# Patient Record
Sex: Female | Born: 1935 | Race: White | Hispanic: No | Marital: Married | Smoking: Never smoker
Health system: Southern US, Community
[De-identification: ages and names within clinical notes are randomized; demographics above are authoritative.]

## PROBLEM LIST (undated history)

## (undated) DIAGNOSIS — R5381 Other malaise: Secondary | ICD-10-CM

## (undated) DIAGNOSIS — I639 Cerebral infarction, unspecified: Secondary | ICD-10-CM

## (undated) DIAGNOSIS — K219 Gastro-esophageal reflux disease without esophagitis: Secondary | ICD-10-CM

## (undated) DIAGNOSIS — C801 Malignant (primary) neoplasm, unspecified: Secondary | ICD-10-CM

## (undated) DIAGNOSIS — N390 Urinary tract infection, site not specified: Secondary | ICD-10-CM

## (undated) DIAGNOSIS — M1990 Unspecified osteoarthritis, unspecified site: Secondary | ICD-10-CM

## (undated) DIAGNOSIS — M069 Rheumatoid arthritis, unspecified: Secondary | ICD-10-CM

## (undated) DIAGNOSIS — G4700 Insomnia, unspecified: Secondary | ICD-10-CM

## (undated) DIAGNOSIS — I340 Nonrheumatic mitral (valve) insufficiency: Secondary | ICD-10-CM

## (undated) DIAGNOSIS — I351 Nonrheumatic aortic (valve) insufficiency: Secondary | ICD-10-CM

## (undated) DIAGNOSIS — Z8719 Personal history of other diseases of the digestive system: Secondary | ICD-10-CM

## (undated) DIAGNOSIS — F0390 Unspecified dementia without behavioral disturbance: Secondary | ICD-10-CM

## (undated) DIAGNOSIS — M4302 Spondylolysis, cervical region: Secondary | ICD-10-CM

## (undated) DIAGNOSIS — I10 Essential (primary) hypertension: Secondary | ICD-10-CM

## (undated) DIAGNOSIS — M8580 Other specified disorders of bone density and structure, unspecified site: Secondary | ICD-10-CM

## (undated) DIAGNOSIS — R4189 Other symptoms and signs involving cognitive functions and awareness: Secondary | ICD-10-CM

## (undated) DIAGNOSIS — D099 Carcinoma in situ, unspecified: Secondary | ICD-10-CM

## (undated) DIAGNOSIS — G459 Transient cerebral ischemic attack, unspecified: Secondary | ICD-10-CM

## (undated) DIAGNOSIS — E785 Hyperlipidemia, unspecified: Secondary | ICD-10-CM

## (undated) DIAGNOSIS — C569 Malignant neoplasm of unspecified ovary: Secondary | ICD-10-CM

## (undated) DIAGNOSIS — Z973 Presence of spectacles and contact lenses: Secondary | ICD-10-CM

## (undated) DIAGNOSIS — N12 Tubulo-interstitial nephritis, not specified as acute or chronic: Secondary | ICD-10-CM

## (undated) DIAGNOSIS — R627 Adult failure to thrive: Secondary | ICD-10-CM

## (undated) DIAGNOSIS — C50919 Malignant neoplasm of unspecified site of unspecified female breast: Secondary | ICD-10-CM

## (undated) HISTORY — DX: Other malaise: R53.81

## (undated) HISTORY — DX: Rheumatoid arthritis, unspecified: M06.9

## (undated) HISTORY — DX: Hyperlipidemia, unspecified: E78.5

## (undated) HISTORY — DX: Other specified disorders of bone density and structure, unspecified site: M85.80

## (undated) HISTORY — PX: EYE SURGERY: SHX253

## (undated) HISTORY — DX: Tubulo-interstitial nephritis, not specified as acute or chronic: N12

## (undated) HISTORY — DX: Malignant neoplasm of unspecified site of unspecified female breast: C50.919

## (undated) HISTORY — DX: Spondylolysis, cervical region: M43.02

## (undated) HISTORY — DX: Nonrheumatic aortic (valve) insufficiency: I35.1

## (undated) HISTORY — DX: Malignant neoplasm of unspecified ovary: C56.9

## (undated) HISTORY — PX: BREAST LUMPECTOMY: SHX2

## (undated) HISTORY — DX: Transient cerebral ischemic attack, unspecified: G45.9

## (undated) HISTORY — PX: KNEE ARTHROSCOPY: SHX127

## (undated) HISTORY — DX: Nonrheumatic mitral (valve) insufficiency: I34.0

## (undated) HISTORY — DX: Insomnia, unspecified: G47.00

## (undated) HISTORY — DX: Gastro-esophageal reflux disease without esophagitis: K21.9

## (undated) HISTORY — DX: Adult failure to thrive: R62.7

---

## 1987-09-26 HISTORY — PX: ABDOMINAL HYSTERECTOMY: SHX81

## 1994-09-25 DIAGNOSIS — I639 Cerebral infarction, unspecified: Secondary | ICD-10-CM

## 1994-09-25 HISTORY — DX: Cerebral infarction, unspecified: I63.9

## 1998-01-11 ENCOUNTER — Encounter: Admission: RE | Admit: 1998-01-11 | Discharge: 1998-04-11 | Payer: Self-pay | Admitting: *Deleted

## 1998-06-24 ENCOUNTER — Ambulatory Visit (HOSPITAL_COMMUNITY): Admission: RE | Admit: 1998-06-24 | Discharge: 1998-06-24 | Payer: Self-pay | Admitting: *Deleted

## 1998-09-15 ENCOUNTER — Other Ambulatory Visit: Admission: RE | Admit: 1998-09-15 | Discharge: 1998-09-15 | Payer: Self-pay | Admitting: Gynecology

## 1998-12-01 ENCOUNTER — Ambulatory Visit (HOSPITAL_COMMUNITY): Admission: RE | Admit: 1998-12-01 | Discharge: 1998-12-01 | Payer: Self-pay | Admitting: Gynecology

## 1998-12-01 ENCOUNTER — Encounter: Payer: Self-pay | Admitting: Gynecology

## 1999-06-13 ENCOUNTER — Encounter: Payer: Self-pay | Admitting: Gynecology

## 1999-06-13 ENCOUNTER — Ambulatory Visit (HOSPITAL_COMMUNITY): Admission: RE | Admit: 1999-06-13 | Discharge: 1999-06-13 | Payer: Self-pay | Admitting: Gynecology

## 1999-09-29 ENCOUNTER — Other Ambulatory Visit: Admission: RE | Admit: 1999-09-29 | Discharge: 1999-09-29 | Payer: Self-pay | Admitting: Gynecology

## 2000-07-03 ENCOUNTER — Encounter: Payer: Self-pay | Admitting: *Deleted

## 2000-07-03 ENCOUNTER — Ambulatory Visit (HOSPITAL_COMMUNITY): Admission: RE | Admit: 2000-07-03 | Discharge: 2000-07-03 | Payer: Self-pay | Admitting: *Deleted

## 2000-10-02 ENCOUNTER — Other Ambulatory Visit: Admission: RE | Admit: 2000-10-02 | Discharge: 2000-10-02 | Payer: Self-pay | Admitting: Gynecology

## 2001-07-11 ENCOUNTER — Encounter: Payer: Self-pay | Admitting: *Deleted

## 2001-07-11 ENCOUNTER — Encounter: Admission: RE | Admit: 2001-07-11 | Discharge: 2001-07-11 | Payer: Self-pay | Admitting: *Deleted

## 2001-10-08 ENCOUNTER — Other Ambulatory Visit: Admission: RE | Admit: 2001-10-08 | Discharge: 2001-10-08 | Payer: Self-pay | Admitting: Gynecology

## 2002-07-17 ENCOUNTER — Encounter: Admission: RE | Admit: 2002-07-17 | Discharge: 2002-07-17 | Payer: Self-pay | Admitting: *Deleted

## 2002-07-17 ENCOUNTER — Encounter: Payer: Self-pay | Admitting: *Deleted

## 2002-10-10 ENCOUNTER — Other Ambulatory Visit: Admission: RE | Admit: 2002-10-10 | Discharge: 2002-10-10 | Payer: Self-pay | Admitting: Gynecology

## 2003-01-09 ENCOUNTER — Encounter: Payer: Self-pay | Admitting: Ophthalmology

## 2003-01-13 ENCOUNTER — Ambulatory Visit (HOSPITAL_COMMUNITY): Admission: RE | Admit: 2003-01-13 | Discharge: 2003-01-13 | Payer: Self-pay | Admitting: Ophthalmology

## 2003-07-20 ENCOUNTER — Encounter: Admission: RE | Admit: 2003-07-20 | Discharge: 2003-07-20 | Payer: Self-pay | Admitting: *Deleted

## 2003-07-20 ENCOUNTER — Encounter: Payer: Self-pay | Admitting: *Deleted

## 2003-10-15 ENCOUNTER — Other Ambulatory Visit: Admission: RE | Admit: 2003-10-15 | Discharge: 2003-10-15 | Payer: Self-pay | Admitting: Gynecology

## 2004-02-25 ENCOUNTER — Encounter: Admission: RE | Admit: 2004-02-25 | Discharge: 2004-02-25 | Payer: Self-pay | Admitting: Rheumatology

## 2004-07-21 ENCOUNTER — Encounter: Admission: RE | Admit: 2004-07-21 | Discharge: 2004-07-21 | Payer: Self-pay | Admitting: Gynecology

## 2004-11-03 ENCOUNTER — Other Ambulatory Visit: Admission: RE | Admit: 2004-11-03 | Discharge: 2004-11-03 | Payer: Self-pay | Admitting: Gynecology

## 2004-11-21 ENCOUNTER — Inpatient Hospital Stay (HOSPITAL_COMMUNITY): Admission: EM | Admit: 2004-11-21 | Discharge: 2004-11-23 | Payer: Self-pay | Admitting: Internal Medicine

## 2005-08-09 ENCOUNTER — Encounter: Admission: RE | Admit: 2005-08-09 | Discharge: 2005-08-09 | Payer: Self-pay | Admitting: *Deleted

## 2005-11-29 ENCOUNTER — Other Ambulatory Visit: Admission: RE | Admit: 2005-11-29 | Discharge: 2005-11-29 | Payer: Self-pay | Admitting: Gynecology

## 2006-01-24 ENCOUNTER — Encounter: Payer: Self-pay | Admitting: *Deleted

## 2006-08-20 ENCOUNTER — Encounter: Admission: RE | Admit: 2006-08-20 | Discharge: 2006-08-20 | Payer: Self-pay | Admitting: Pediatric Nephrology

## 2006-12-07 ENCOUNTER — Other Ambulatory Visit: Admission: RE | Admit: 2006-12-07 | Discharge: 2006-12-07 | Payer: Self-pay | Admitting: Gynecology

## 2007-10-01 ENCOUNTER — Encounter: Admission: RE | Admit: 2007-10-01 | Discharge: 2007-10-01 | Payer: Self-pay | Admitting: Gynecology

## 2008-11-04 ENCOUNTER — Encounter: Admission: RE | Admit: 2008-11-04 | Discharge: 2008-11-04 | Payer: Self-pay | Admitting: Gynecology

## 2008-12-17 ENCOUNTER — Encounter: Admission: RE | Admit: 2008-12-17 | Discharge: 2008-12-17 | Payer: Self-pay | Admitting: Rheumatology

## 2010-02-05 IMAGING — CR DG CERVICAL SPINE COMPLETE 4+V
6 series · 6 of 6 positions shown · non-contrast
Comparison: none

CLINICAL DATA: Bilateral neck pain.  Numbness and tingling in
bilateral fingers.  No injury.

CERVICAL SPINE - COMPLETE 4+ VIEW
cervical spine radiographs.

[view not recorded (1 of 6)]
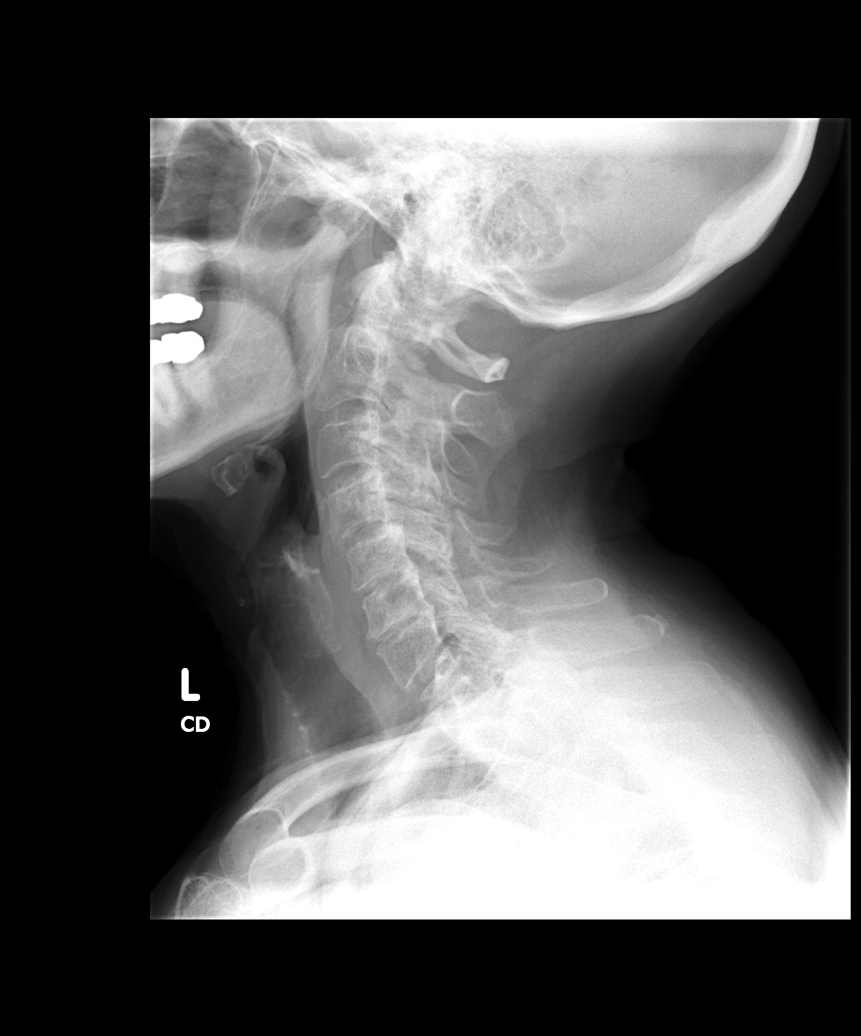

[view not recorded (2 of 6)]
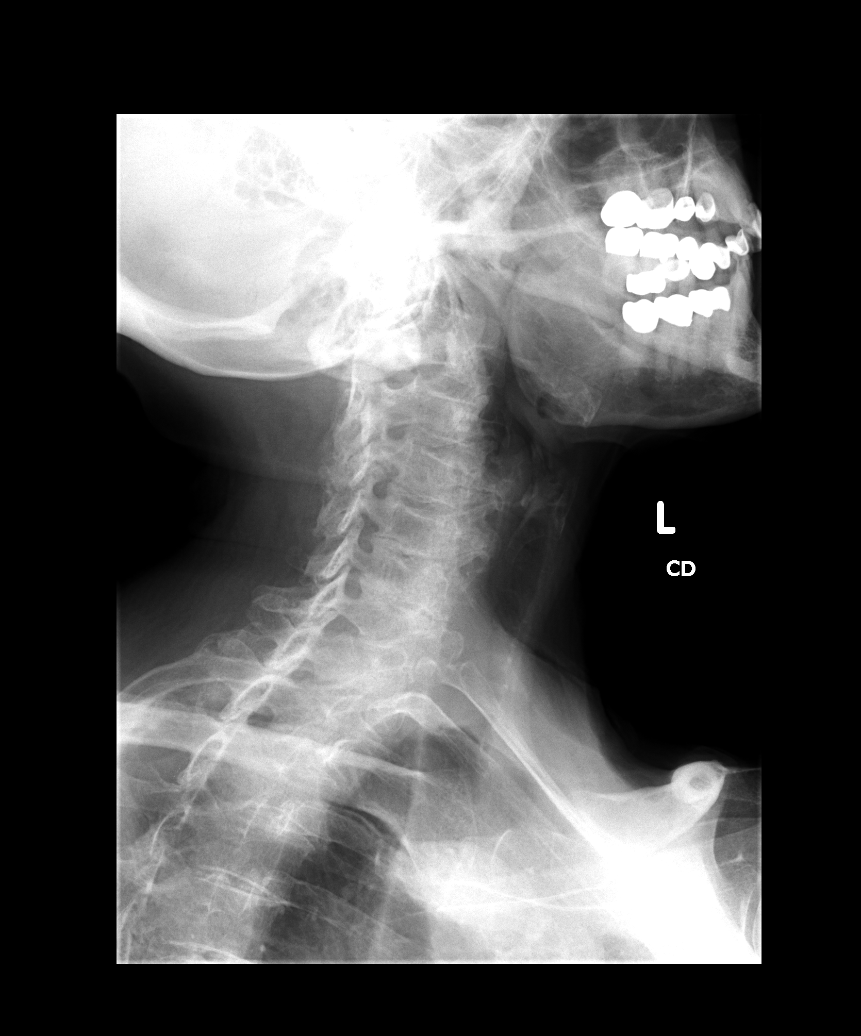

[view not recorded (3 of 6)]
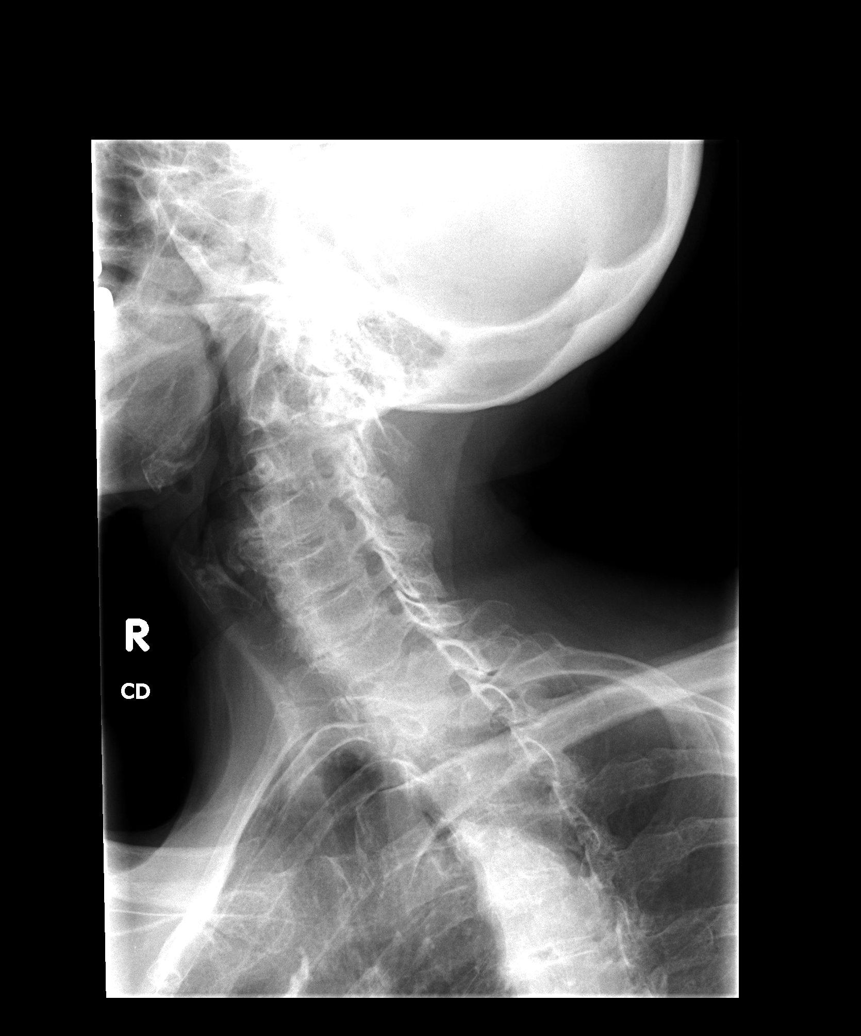

[view not recorded (4 of 6)]
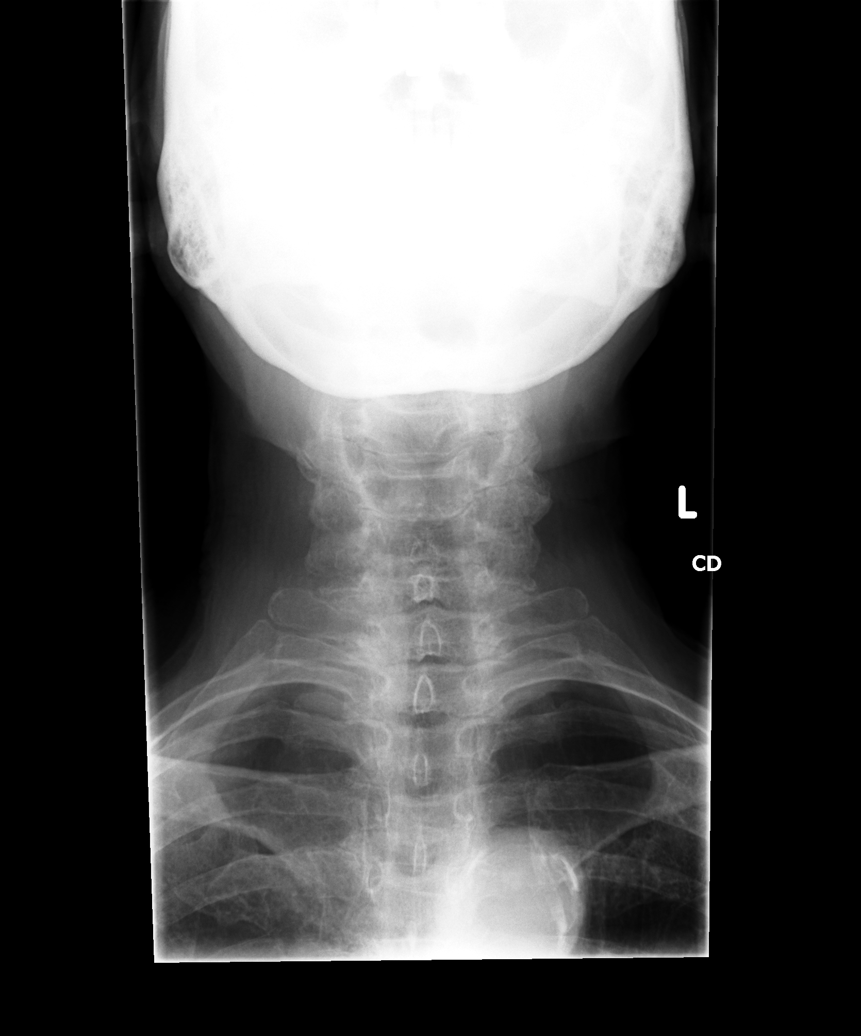

[view not recorded (5 of 6)]
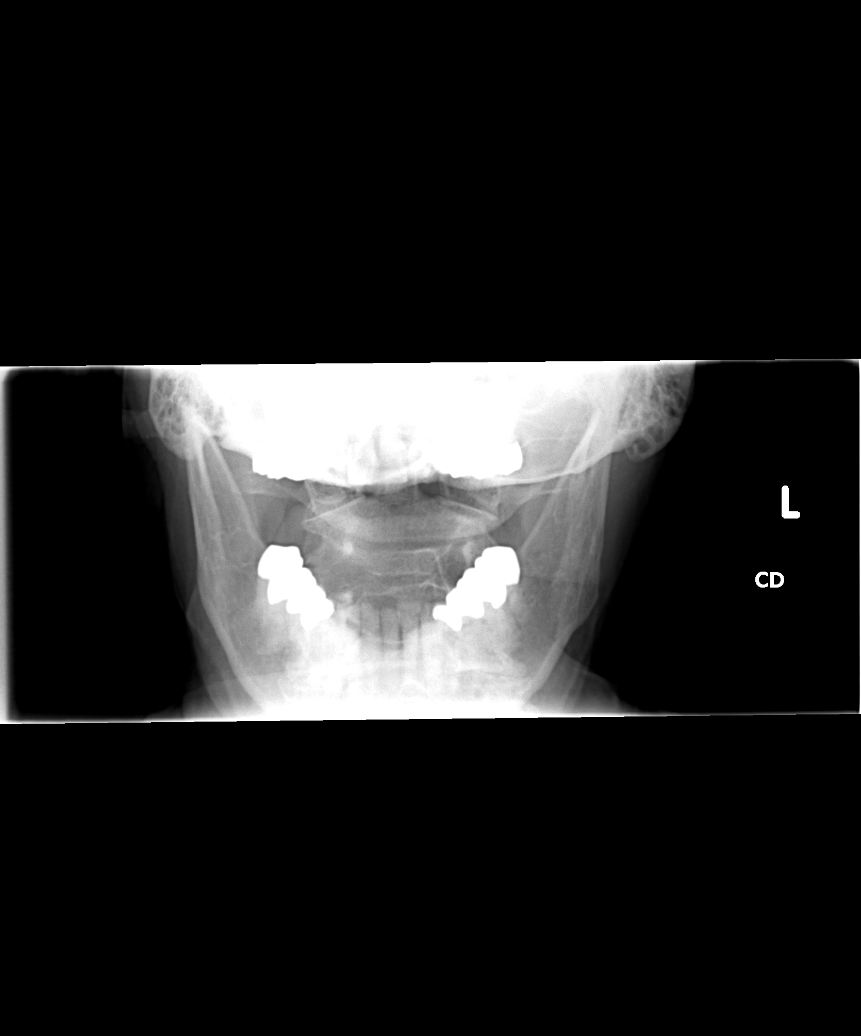

[view not recorded (6 of 6)]
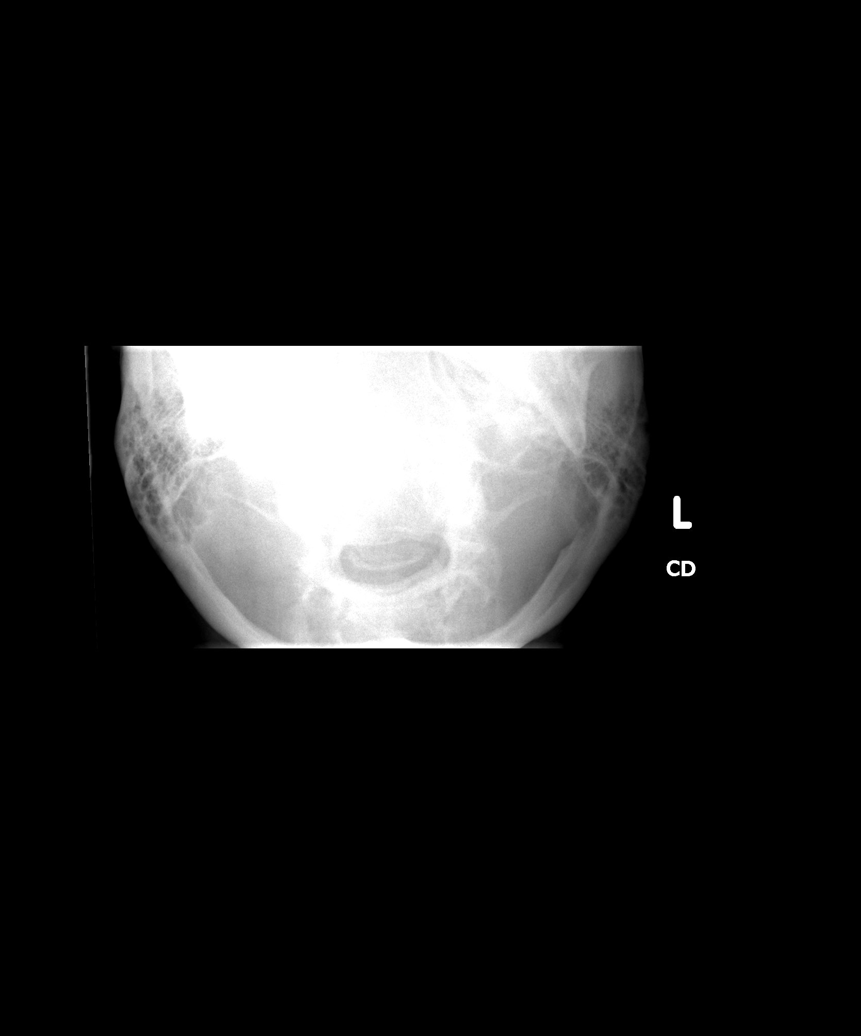

[6 of 6 positions shown; findings below may reference images not displayed]

FINDINGS: Diffuse osteopenia with stable moderately severe
degenerative disc disease C5-6 and C6-7 noted.  Interval 2 mm
degenerative anterolisthesis is seen at C4-5.  Remaining posterior
vertebral alignment normally maintained.  Uncinate degenerative
joint disease with slight bilateral neural foraminal narrowing
maximal at C6-7 and lesser C5-6 and C4-5.  Stable bilateral facet
degenerative joint disease is maximal at C4-5 and C5-6.
IMPRESSION: 1.  Interval 2 mm degenerative anterolisthesis C4-5.
2.  Stable diffuse osteopenia and multilevel degenerative changes
as described.
3.  No additional acute findings.

## 2011-02-10 NOTE — Discharge Summary (Signed)
NAMEJLEE, Alexa Rowe                 ACCOUNT NO.:  [id number]   MEDICAL RECORD NO.:  [id number]          PATIENT TYPE:  INP   LOCATION:  0450                         FACILITY:  [facility]   PHYSICIAN:  Jackie Plum, M.D.DATE OF BIRTH:  04-15-1918   DATE OF ADMISSION:  11/21/2004  DATE OF DISCHARGE:  11/23/2004                                 DISCHARGE SUMMARY   DISCHARGE DIAGNOSES:  1.  Intractable nausea and vomiting presents secondary to partial small-      bowel obstruction, resolved.  2.  __________ abdominal x-ray did not reveal any acute infiltrate.  There      is question of probable chronic obstructive pulmonary disease -      outpatient followup recommended.  Patient respiratory wise is otherwise      doing well with a discharge oxygen saturation of 98% on room air.  3.  History of ovarian cancer status post radiation therapy, hypertension,      dyslipidemia, arthritis.   DISCHARGE MEDICATIONS:  Patient is going home to resume all her preadmission  medications as previously.  These include methotrexate, prednisone,  Celebrex, hydrochlorothiazide, Fosamax, folic acid, baby aspirin,  multivitamin, metoprolol, verapamil and ranitidine.   DISCHARGE LABORATORIES:  WBC 7.5, hemoglobin 12.8, hematocrit 36.9, MCV 2.7,  platelet count 242.  Sodium 138, potassium 3.4, chloride 105, CO2 26,  glucose 96, BUN 9, creatinine 0.8, calcium 8.4.   CONSULTATION:  Not applicable.   PROCEDURES:  Not applicable.   DISPOSITION:  Discharged improved and satisfactory.   REASON FOR ADMISSION:  Nausea and vomiting and presumptive small-bowel  obstruction.  Patient presented to her PCP's office with prolonged worsening  of nausea/vomiting.  She has history of multiple abdominal surgeries.  Dr.  Hyacinth Meeker who was covering for patient's PCP Dr. Janey Greaser obtained an x-ray of  the abdomen which according to reading indicated a partial small-bowel  obstruction and was therefore referred to the hospital  for admission.  On  admission patient was not in acute distress.  Her lung exam is unremarkable,  her abdomen was soft with mild tenderness in the periumbilical area, bowel  sounds were present and they were slightly hyperactive.  Her lab work was  notable for sodium of 128 with normal potassium of 3.6 and BUN of 17,  creatinine 1.1.  She is therefore admitted for presumptive small-bowel  obstruction, partial with secondary intractable nausea and vomiting as well  as hyponatremia.   HOSPITAL COURSE:  Patient was started on a regimen of bowel rest and IV  fluid supplementation with monitoring of her electrolytes.  Followup x-ray  revealed absence of any obstruction and she was started on clear fluids  which was gradually advanced.  Patient able to tolerate diet  without any  problems over the last 24 hours and she is deemed appropriate for discharge  today.  Her bowel obstruction has therefore resolved and her hyponatremia  has also been corrected.   Patient had been complaining of some annoying cough which was treated  symptomatically since x-ray did reveal any acute infiltrate as noted above  and the incidental finding  of possible COPD and she may need outpatient PFTs  in this regard if deemed appropriate.  Her O2 saturation seems to be okay at  98% on room air though.  On rounds today patient feels fine, does not have  any nausea or vomiting, no fever or chills, no shortness of breath, no chest  pain, she has been tolerant of all her meals and is ready to go home.  Her  vital signs this morning were stable, her BP was 164/84 (patient on  admission we held her verapamil which is going to be added back to her  medication regimen and hopefully this would improve her systolic  hypertension, her heart rate is 97 per minute).  Her temperature was 98.5,  saturation is 98% on room air, lung exam does not reveal any adventitious  sounds, abdomen is soft and nontender, bowel sounds present.   There was no  __________, there is no edema and no cords.  She is alert, oriented x3,  ambulatory without any problems and is ready for discharge with outpatient  followup routinely as needed.      GO/MEDQ  D:  11/23/2004  T:  11/23/2004  Job:  [id number]

## 2011-02-10 NOTE — H&P (Signed)
Alexa Rowe, Alexa Rowe                 ACCOUNT NO.:  [id number]   MEDICAL RECORD NO.:  [id number]          PATIENT TYPE:  INP   LOCATION:  0450                         FACILITY:  [facility]   PHYSICIAN:  Jackie Plum, M.D.DATE OF BIRTH:  [birth year]   DATE OF ADMISSION:  11/21/2004  DATE OF DISCHARGE:                                HISTORY & PHYSICAL   CHIEF COMPLAINT:  Nausea and vomiting.   HISTORY OF PRESENT ILLNESS:  The patient is an 77 year old [place] lady  with a history of previous abdominal surgeries who presents with above  complaints.  According to the patient, she had been in her usual state of  health until three days ago when she started having nausea and vomiting.  She has had some mild abdominal discomfort mainly in the periumbilical area,  redundant diarrhea, but admits to constipation.  No history of dysuria or  frequency of micturition. She denies any history of heat or cold  intolerance. She denies any chest pain, shortness of breath, cough, sputum  production.  She denies any dizziness. She has not been able to eat much  over the last three days because of her nausea and vomiting and therefore  wanted to see her primary care physician. When she was seen by Dr. Hyacinth Meeker,  an x-ray was done which indicated partial small bowel obstruction and she  was referred to the hospital for that admission.   PAST MEDICAL HISTORY:  1.  History of intestinal blockages several times 1992.  The last one was      in August 1996.  2.  She has a history of ovarian cancer status post radiation treatment.  3.  She has a history of hypertension.  4.  History of arthritis.  5.  History of dyslipidemia.   MEDICATIONS:  Negative for any allergies.   CURRENT MEDICATIONS:  1.  Ranitidine 150 mg at night.  2.  Verapamil 120 mg daily.  3.  Metoprolol 50 mg daily.  4.  Multivitamins.  5.  Baby aspirin.  6.  Folic acid.  7.  Fosamax.  8.  HCTZ.  9.  Celebrex.  10. Prednisone.  11. She  takes methotrexate weekly.   SOCIAL HISTORY:  The patient lives with her husband, she is really  independent with activities of daily living. Does no smoke cigarettes or  drink alcohol.   FAMILY HISTORY:  Insignificant in view of her advanced age.   PHYSICAL EXAMINATION:  VITAL SIGNS:  Stable.  HEENT:  Normocephalic, atraumatic.  Pupils equal round and reactive to  light. Extraocular movements intact. Oropharynx is slightly dry, no exudate  or erythema.  NECK:  Supple, no JVD.  LUNGS:  Clear to auscultation.  ABDOMEN:  She had subumbilical surgical scar. It was old and was healed.  Abdomen was soft, however, she had some mild tenderness in the periumbilical  area. Bowel sounds were present and were slightly hyperactive.  No rebound  tenderness.  EXTREMITIES:  No cyanosis, no edema.  CNS:  The patient is alert.  Exam was nonfocal.   LABORATORY DATA:  X-ray of the abdomen  was read by Dr. Sigmund Hazel as  dictated, small partial obstruction.  X-ray is not available for my review  at this point.  CBC within normal limits. Hemoglobin was 13.4.  Basic  metabolic panel, sodium was 128, potassium 3.6, chloride 0 to 26. Glucose  115, BUN 17, creatinine 1.1, calcium 9.2.   IMPRESSION:  1.  Presumptive small bowel obstruction with subsequent intractable nausea      and vomiting.  2.  Hypokalemia secondary to #1.   The patient will be admitted to the hospitalist's service on a regimen of  bowel rest, monitoring of her electrolytes with appropriate corrections as  needed. Abdomen x-ray will be repeated in the morning. The patient does not  seem to need any surgical intervention right now; however, we will get  surgery to see if her nausea and vomiting worsens in the hospital. She will  be put on some antiemetics , IV Fluids and other supportive measures.  Will  check a UA for completeness sake.  Should she worsen, would call surgery for  evaluation in view of her multiple abdominal surgery  history.      GO/MEDQ  D:  11/21/2004  T:  11/21/2004  Job:  [id number]   cc:   Sigmund Hazel, M.D.  [address]  [address] [place], [place] [address]  Fax: [phone]   Al Decant. Janey Greaser, MD  [address]  [place]  [place] [address]  Fax: [phone]

## 2011-02-10 NOTE — Op Note (Signed)
NAME:  Alexa Rowe, Alexa Rowe                           ACCOUNT NO.:  [id number]   MEDICAL RECORD NO.:  [id number]                   PATIENT TYPE:  OIB   LOCATION:  2899                                 FACILITY:  [facility]   PHYSICIAN:  Guadelupe Sabin, M.D.             DATE OF BIRTH:  1918-04-18   DATE OF PROCEDURE:  01/13/2003  DATE OF DISCHARGE:                                 OPERATIVE REPORT   PREOPERATIVE DIAGNOSIS:  Senile nuclear cataract, left eye.   POSTOPERATIVE DIAGNOSIS:  Senile nuclear cataract, left eye.   OPERATIONS:  Planned extracapsular cataract extraction, phakoemulsification,  and primary insertion of posterior chamber intraocular lens implant.   SURGEON:  Guadelupe Sabin, M.D.   ASSISTANT:  Nurse.   ANESTHESIA:  Local 4% Xylocaine, 0.75 Marcaine retrobulbar block without  Wydase, topical tetracaine, and intraocular Xylocaine.  Anesthesia standby  required.  The patient was given sodium pentothal intravenously during the  period of retrobulbar blocking.   DESCRIPTION OF PROCEDURE:  After the patient was prepped and draped, a lid  speculum was inserted in the left eye.  Schiotz tonometry was recorded at 5  scale units with a 5.5 g weight indicating a normal intraocular pressure.  A  superior rectus traction suture was placed.  A peritomy was performed  adjacent to the limbus from the 11 to 1 o'clock position.  The corneoscleral  junction was cleaned and the corneoscleral groove made with the 45 degree  Superblade.  The anterior chamber was then entered with the 2.5 mm diamond  keratome at the 12 o'clock position and the 15 degree blade at the 2:30  position.  Using a bent 26 gauge needle on a Healon syringe, a circular  capsulorrhexis was begun and then completed with the Grabow forceps.  Hydrodissection and hydrodelineation were performed using 1% Xylocaine.  A  30 degree phakoemulsification tip was then inserted with slow controlled  emulsification with back lens  cracking with the Beckard pick.  Total  ultrasonic time 54 seconds.  Average power level 12%.  Total amount of fluid  used 50 mL.  Following removal of the nucleus, the residual cortex was  aspirated with the irrigation-aspiration tip.  The posterior capsule  appeared intact with a brilliant red fundus reflex.  It was therefore  elected to insert and Aller[employer] Medical Optic [id number] three-piece silicone  posterior chamber intraocular lens implant, diopter strength +17.50.  This  was inserted with the McDonald forceps into the anterior chamber and then  centered into the capsular bag using the [facility] lens rotator.  The lens  appeared to be well centered.  The Healon, which had been used throughout  the procedure, was aspirated and replaced with balanced salt solution and  Miochol ophthalmic solution.  The operative incisions appeared to be self-  sealing.  It was elected, however, since the incision had been widened  slightly to insert a single 10-0  interrupted nylon radial suture across the  12 o'clock  incision.  Maxitrol ointment was instilled in the conjunctival cul-de-sac  and a light patch and protective shield applied to the operated left eye.  Duration of procedure and anesthesia administration 45 minutes.  The patient  tolerated the procedure well in general and left the operating room for the  recovery room in good condition.                                               Guadelupe Sabin, M.D.    HNJ/MEDQ  D:  01/13/2003  T:  01/13/2003  Job:  [id number]   cc:   Al Decant. Janey Greaser, M.D.  [address]  [place]  [place] [address]  Fax: [phone]

## 2011-02-10 NOTE — H&P (Signed)
   NAME:  Alexa Rowe, Alexa Rowe                           ACCOUNT NO.:  [id number]   MEDICAL RECORD NO.:  [id number]                   PATIENT TYPE:  OIB   LOCATION:  2899                                 FACILITY:  [facility]   PHYSICIAN:  Guadelupe Sabin, M.D.             DATE OF BIRTH:  October 21, 1917   DATE OF ADMISSION:  01/13/2003  DATE OF DISCHARGE:                                HISTORY & PHYSICAL   HISTORY OF PRESENT ILLNESS:  This was a planned outpatient surgical  admission of this 77 year old white female admitted for cataract implant  surgery of the left eye.   This patient has been noted to have slowly progressive cataract formation in  both eyes.  Recently, however, her vision has deteriorated and the patient  has experienced blurring with difficulty in her daily tasks with reading  road signs, difficulty seeing television, reading, night vision, bright  sunlight vision, some difficulty with her housework, and depth perception.  She signed an informed consent and arrangements were made for her outpatient  admission at this time.   PAST MEDICAL HISTORY:  The patient is in stable health under the care of  Al Decant. Janey Greaser, M.D.  She is felt to be in satisfactory condition for the  proposed surgery.   CURRENT MEDICATIONS:  1. Aspirin 81 mg.  2. Reserpine.  3. Evista.   ALLERGIES:  No known allergies.   PHYSICAL EXAMINATION:  VITAL SIGNS:  As recorded on admission, blood  pressure 156/83, temperature 97.3 degrees, pulse 84, and respirations 18.  GENERAL APPEARANCE:  The patient is a pleasant, 77 year old, white female in  no acute distress.  HEENT:  Eyes:  Visual acuity as noted above.  On external ocular and slit  lamp examination, the eyes are white and clear with a clear cornea and deep  and clear anterior chamber.  Nuclear cataract formation is present, greater  in the left than right eye.  Detailed fundus examination dilated reveals a  clear vitreous and attached retina with  normal optic nerve, blood vessels,  and macula.  CHEST:  Lungs clear to auscultation.  HEART:  Normal sinus rhythm.  No cardiomegaly.  No murmurs.  ABDOMEN:  Negative.  EXTREMITIES:  Negative.    ADMISSION DIAGNOSIS:  Senile nuclear cataracts, left eye greater than right.   SURGICAL PLAN:  Cataract implant surgery, left eye now and right eye later.                                               Guadelupe Sabin, M.D.    HNJ/MEDQ  D:  01/13/2003  T:  01/13/2003  Job:  [id number]   cc:   Al Decant. Janey Greaser, M.D.  [address]  [place]  [place] [address]  Fax: [phone]

## 2011-08-02 ENCOUNTER — Ambulatory Visit (INDEPENDENT_AMBULATORY_CARE_PROVIDER_SITE_OTHER): Payer: Self-pay

## 2011-08-02 DIAGNOSIS — F4320 Adjustment disorder, unspecified: Secondary | ICD-10-CM

## 2011-08-08 ENCOUNTER — Ambulatory Visit: Payer: Self-pay

## 2012-04-19 ENCOUNTER — Inpatient Hospital Stay (HOSPITAL_COMMUNITY)
Admission: EM | Admit: 2012-04-19 | Discharge: 2012-04-21 | DRG: 690 | Disposition: A | Discharge status: Nursing Home (Includes ICF) | Payer: Medicare Other | Attending: Endocrinology | Admitting: Endocrinology

## 2012-04-19 ENCOUNTER — Encounter (HOSPITAL_COMMUNITY): Payer: Self-pay | Admitting: Emergency Medicine

## 2012-04-19 DIAGNOSIS — K219 Gastro-esophageal reflux disease without esophagitis: Secondary | ICD-10-CM

## 2012-04-19 DIAGNOSIS — I10 Essential (primary) hypertension: Secondary | ICD-10-CM | POA: Diagnosis present

## 2012-04-19 DIAGNOSIS — R531 Weakness: Secondary | ICD-10-CM | POA: Diagnosis present

## 2012-04-19 DIAGNOSIS — C569 Malignant neoplasm of unspecified ovary: Secondary | ICD-10-CM

## 2012-04-19 DIAGNOSIS — N390 Urinary tract infection, site not specified: Secondary | ICD-10-CM

## 2012-04-19 DIAGNOSIS — I080 Rheumatic disorders of both mitral and aortic valves: Secondary | ICD-10-CM

## 2012-04-19 DIAGNOSIS — I359 Nonrheumatic aortic valve disorder, unspecified: Secondary | ICD-10-CM

## 2012-04-19 DIAGNOSIS — M949 Disorder of cartilage, unspecified: Secondary | ICD-10-CM | POA: Diagnosis present

## 2012-04-19 DIAGNOSIS — E871 Hypo-osmolality and hyponatremia: Secondary | ICD-10-CM | POA: Diagnosis present

## 2012-04-19 DIAGNOSIS — N10 Acute tubulo-interstitial nephritis: Principal | ICD-10-CM

## 2012-04-19 DIAGNOSIS — M069 Rheumatoid arthritis, unspecified: Secondary | ICD-10-CM

## 2012-04-19 DIAGNOSIS — A498 Other bacterial infections of unspecified site: Secondary | ICD-10-CM | POA: Diagnosis present

## 2012-04-19 DIAGNOSIS — E785 Hyperlipidemia, unspecified: Secondary | ICD-10-CM | POA: Diagnosis present

## 2012-04-19 DIAGNOSIS — M1990 Unspecified osteoarthritis, unspecified site: Secondary | ICD-10-CM

## 2012-04-19 DIAGNOSIS — C50919 Malignant neoplasm of unspecified site of unspecified female breast: Secondary | ICD-10-CM

## 2012-04-19 DIAGNOSIS — G459 Transient cerebral ischemic attack, unspecified: Secondary | ICD-10-CM

## 2012-04-19 DIAGNOSIS — R5381 Other malaise: Secondary | ICD-10-CM | POA: Diagnosis present

## 2012-04-19 DIAGNOSIS — E876 Hypokalemia: Secondary | ICD-10-CM | POA: Diagnosis present

## 2012-04-19 DIAGNOSIS — R269 Unspecified abnormalities of gait and mobility: Secondary | ICD-10-CM

## 2012-04-19 DIAGNOSIS — E860 Dehydration: Secondary | ICD-10-CM | POA: Diagnosis present

## 2012-04-19 DIAGNOSIS — M899 Disorder of bone, unspecified: Secondary | ICD-10-CM

## 2012-04-19 DIAGNOSIS — R5383 Other fatigue: Secondary | ICD-10-CM | POA: Diagnosis present

## 2012-04-19 HISTORY — DX: Unspecified osteoarthritis, unspecified site: M19.90

## 2012-04-19 HISTORY — DX: Essential (primary) hypertension: I10

## 2012-04-19 LAB — CBC WITH DIFFERENTIAL/PLATELET
Hemoglobin: 13 g/dL (ref 12.0–15.0)
Lymphocytes Relative: 8 % — ABNORMAL LOW (ref 12–46)
Lymphs Abs: 0.9 10*3/uL (ref 0.7–4.0)
Monocytes Relative: 8 % (ref 3–12)
Neutro Abs: 10.2 10*3/uL — ABNORMAL HIGH (ref 1.7–7.7)
Neutrophils Relative %: 84 % — ABNORMAL HIGH (ref 43–77)
RBC: 4.32 MIL/uL (ref 3.87–5.11)

## 2012-04-19 LAB — BASIC METABOLIC PANEL
BUN: 11 mg/dL (ref 6–23)
Chloride: 95 mEq/L — ABNORMAL LOW (ref 96–112)
GFR calc Af Amer: 83 mL/min — ABNORMAL LOW (ref >90)
Glucose, Bld: 101 mg/dL — ABNORMAL HIGH (ref 70–99)
Potassium: 3.2 mEq/L — ABNORMAL LOW (ref 3.5–5.1)

## 2012-04-19 LAB — URINE MICROSCOPIC-ADD ON

## 2012-04-19 LAB — URINALYSIS, ROUTINE W REFLEX MICROSCOPIC
Bilirubin Urine: NEGATIVE
Nitrite: NEGATIVE
Specific Gravity, Urine: 1.015 (ref 1.005–1.030)
pH: 7 (ref 5.0–8.0)

## 2012-04-19 MED ORDER — SODIUM CHLORIDE 0.9 % IV SOLN
INTRAVENOUS | Status: DC
  Administered 2012-04-20: via INTRAVENOUS

## 2012-04-19 MED ORDER — POTASSIUM CHLORIDE 10 MEQ/100ML IV SOLN
10.0000 meq | INTRAVENOUS | Status: AC
  Administered 2012-04-19 – 2012-04-20 (×3): 10 meq via INTRAVENOUS
  Filled 2012-04-19 (×4): qty 100

## 2012-04-19 MED ORDER — DEXTROSE 5 % IV SOLN
1.0000 g | INTRAVENOUS | Status: DC
  Filled 2012-04-19: qty 10

## 2012-04-19 MED ORDER — ENOXAPARIN SODIUM 30 MG/0.3ML ~~LOC~~ SOLN
30.0000 mg | Freq: Every day | SUBCUTANEOUS | Status: DC
  Administered 2012-04-19 – 2012-04-20 (×2): 30 mg via SUBCUTANEOUS
  Filled 2012-04-19 (×3): qty 0.3

## 2012-04-19 MED ORDER — ASPIRIN 81 MG PO CHEW
81.0000 mg | CHEWABLE_TABLET | Freq: Every day | ORAL | Status: DC
  Administered 2012-04-20 – 2012-04-21 (×2): 81 mg via ORAL
  Filled 2012-04-19 (×2): qty 1

## 2012-04-19 MED ORDER — DEXTROSE 5 % IV SOLN
1.0000 g | Freq: Once | INTRAVENOUS | Status: AC
  Administered 2012-04-19: 1 g via INTRAVENOUS
  Filled 2012-04-19: qty 10

## 2012-04-19 MED ORDER — ONDANSETRON HCL 4 MG/2ML IJ SOLN
INTRAMUSCULAR | Status: AC
  Administered 2012-04-19: 18:00:00
  Filled 2012-04-19: qty 2

## 2012-04-19 MED ORDER — SODIUM CHLORIDE 0.9 % IV SOLN
INTRAVENOUS | Status: DC
  Administered 2012-04-19: 125 mL/h via INTRAVENOUS

## 2012-04-19 MED ORDER — GI COCKTAIL ~~LOC~~
30.0000 mL | Freq: Once | ORAL | Status: AC
  Administered 2012-04-19: 30 mL via ORAL
  Filled 2012-04-19: qty 30

## 2012-04-19 NOTE — ED Notes (Signed)
Pt had AMS Monday, started on cipro for UTI on Wednesday. Pt started having N/V today and facility MD diagnosed her with dehydration.

## 2012-04-19 NOTE — ED Notes (Signed)
Per EMS, pt diagnosed with UTI Wed, on ABT since then with no improvement.

## 2012-04-19 NOTE — ED Notes (Signed)
ZOX:WR60<AV> Expected date:04/19/12<BR> Expected time: 5:56 PM<BR> Means of arrival:Ambulance<BR> Comments:<BR> EMS

## 2012-04-19 NOTE — ED Notes (Signed)
Attempted to obtain urine sample. Patient stated unable to provide at this time.

## 2012-04-19 NOTE — ED Notes (Signed)
Pt received Zofran 4mg  IV and 500ccNS bolus per EMS.

## 2012-04-19 NOTE — H&P (Signed)
PCP:   Gwen Pounds, MD   Chief Complaint:  weakness  HPI: 78 yo resident of [facility]. Treated in the last several days for a UTI. Was given Cipro. Cx came back as cipro resistant and was Rx'd bactrim. Now more weak with N/V lethargy. She doesn't remember if she ate anything earlier. She has not vomited. She has some bladder spasms. No CP or SOB Jt pain is at baseline. Not hungry. Vision OK. No HA. Mouth is dry.   Review of Systems:  Review of Systems - Negative except as above.  Past Medical History:Past Medical History (reviewed - no changes required): HTN,   Hyperlipidemia,   OA,   GERD,  TIA with aphasia 1996,  Ovarian cancer 1989,  Breast cancer 1990s s/p XRT, Osteopenia,  Mild AI & MR,   RA,  Insomnia,  Recurrent UTI's Surgical History (reviewed - no changes required): T&A  TAH/BSO (L) Knee surgery Cataracts (L) 04 & (R) 06 S/P SBO 1989 Ovarian cancer S/P surgery and chemo Family History (reviewed - no changes required): Father: died 38: heart issues, CAD Mother: died 37: leukemia Siblings: died 18M: old age; 21M: alzheimers: died 19F: CVA Social History (reviewed - no changes required): married at age 48 years with 4 children and 8 grandchildren, 1 great grandchild Education: [employer], grad 1943    Medications: Prior to Admission medications   Medication Sig Start Date End Date Taking? Authorizing Provider  hydrochlorothiazide (HYDRODIURIL) 25 MG tablet Take 25 mg by mouth See admin instructions. Take 1 tablet daily except for Sundays   Yes Historical Provider, MD  metoprolol (LOPRESSOR) 50 MG tablet Take 50 mg by mouth 2 (two) times daily.   Yes Historical Provider, MD  ondansetron (ZOFRAN-ODT) 4 MG disintegrating tablet Take 4 mg by mouth every 8 (eight) hours as needed. For nausea   Yes Historical Provider, MD  ranitidine (ZANTAC) 150 MG tablet Take 150 mg by mouth 2 (two) times daily.   Yes Historical Provider, MD  simvastatin (ZOCOR) 40 MG tablet Take 40 mg by mouth  every evening.   Yes Historical Provider, MD  sulfamethoxazole-trimethoprim (BACTRIM DS,SEPTRA DS) 800-160 MG per tablet Take 1 tablet by mouth 2 (two) times daily.   Yes Historical Provider, MD  verapamil (VERELAN PM) 180 MG 24 hr capsule Take 180 mg by mouth daily.   Yes Historical Provider, MD    Allergies:  No Known Allergies     Physical Exam: Filed Vitals:   04/19/12 1816  BP: 171/84  Pulse: 72  Temp: 98.4 F (36.9 C)  TempSrc: Oral  Resp: 24  SpO2: 97%   General appearance: slowed mentation, in no distress. Lying flat without dyspnea. Face symmetric, EOMI without nystagmus. Oral membranes moist   neck: no adenopathy, no carotid bruit, no JVD and thyroid not enlarged, symmetric, no tenderness/mass/nodules Resp: clear to auscultation bilaterally, no wheezes rales or rhonchi Cardio: regular rate and rhythm with sys murmur and occas skipped beats GI: soft, non-tender; bowel sounds hypoactive; no masses,  no organomegaly Extremities: extremities significant bilat toe abnormalities, atraumatic, no cyanosis or edema Pulses strong and symmetric Lymph nodes: Cervical adenopathy: no cervical lymphadenopathy Neurologic: awake. Alert, a bit HOH. Slow to answer but speech is fluent and she gives a good hx. Globally weak    Labs on Admission:   [facility] 04/19/12 2002  NA 133*  K 3.2*  CL 95*  CO2 27  GLUCOSE 101*  BUN 11  CREATININE 0.74  CALCIUM 9.1  MG --  PHOS --  Basename 04/19/12 2002  WBC 12.1*  NEUTROABS 10.2*  HGB 13.0  HCT 37.3  MCV 86.3  PLT 196   Results for Alexa, Rowe (MRN [id number]) as of 04/19/2012 22:54  Ref. Range 04/19/2012 19:36  Color, Urine Latest Range: YELLOW  YELLOW  APPearance Latest Range: CLEAR  CLOUDY (A)  Specific Gravity, Urine Latest Range: 1.005-1.030  1.015  pH Latest Range: 5.0-8.0  7.0  Glucose Latest Range: NEGATIVE mg/dL NEGATIVE  Bilirubin Urine Latest Range: NEGATIVE  NEGATIVE  Ketones, ur Latest Range: NEGATIVE  mg/dL NEGATIVE  Protein Latest Range: NEGATIVE mg/dL NEGATIVE  Urobilinogen, UA Latest Range: 0.0-1.0 mg/dL 0.2  Nitrite Latest Range: NEGATIVE  NEGATIVE  Leukocytes, UA Latest Range: NEGATIVE  LARGE (A)  Hgb urine dipstick Latest Range: NEGATIVE  SMALL (A)  WBC, UA Latest Range: <3 WBC/hpf TOO NUMEROUS TO COUNT  RBC / HPF Latest Range: <3 RBC/hpf 0-2  Squamous Epithelial / LPF Latest Range: RARE  RARE  Bacteria, UA Latest Range: RARE  MANY (A)    07.23 Tests: (1) Urine Culture, Routine ([id number]) ! Urine Culture, Routine                             Final report ! Result 1                  Escherichia coli     Greater than 100,000 colony forming units per mL ! Antimicrobial Susceptibility                             Comment          S = Susceptible; I = Intermediate; R = Resistant                         P = Positive; N = Negative                 MICS are expressed in micrograms per mL        Antibiotic                 RSLT#1    RSLT#2    RSLT#3    RSLT#4     Amoxicillin/Clavulanic Acid    S     Ampicillin                     R     Cefazolin                      S     Cefepime                       S     Ceftriaxone                    S     Cefuroxime                     S     Cephalothin                    R     Ciprofloxacin                  R     ESBL  N     Ertapenem                      S     Gentamicin                     S     Imipenem                       S     Levofloxacin                   R     Nitrofurantoin                 S     Piperacillin                   R     Tetracycline                   S     Tobramycin                     S     Trimethoprim/Sulfa             S Radiological Exams on Admission: No results found. Orders placed during the hospital encounter of 04/19/12  . EKG 12-LEAD  . EKG 12-LEAD    Assessment/Plan Principal Problem:  *Pyelonephritis, acuteL: EColi is resistant to several Abx. Will Rx rocephin. WBC up some  at 12K but BP is OK Active Problems:  Unspecified essential hypertension: BP up with stress, cont Rx but hold HCTZ  Other and unspecified hyperlipidemia: continue Rx  Dehydration: hydrate gently  Weakness generalized  Malignant neoplasm of ovary: distant hx  Malignant neoplasm of breast (female), unspecified site: distant hx   Mitral valve insufficiency and aortic valve insufficiency  Osteoarthrosis, unspecified whether generalized or localized, unspecified site Osteopenia: on vit D  Unspecified transient cerebral ischemia: prior TIA, cont ASA  Rheumatoid arthritis: not on chronic steroids or DMARDs  Esophageal reflux: continue H2 blockers  Abnormality of gait: at risk for falls Hypokalemia: replace DVT proph: lovenox Full code   Sadia Belfiore ALAN 04/19/2012, 10:30 PM

## 2012-04-19 NOTE — ED Provider Notes (Addendum)
History     CSN: [id number]  Arrival date & time 04/19/12  1810   First MD Initiated Contact with Patient 04/19/12 1920      Chief Complaint  Patient presents with  . Dehydration    (Consider location/radiation/quality/duration/timing/severity/associated sxs/prior treatment) The history is provided by the patient and a relative.   the patient is a 78 year old, female, with no history of dementia.  She has only hypertension, and arthritis.  She was recently diagnosed with a urinary tract infection, and placed on Cipro.  Her primary care physician, called her today, and notified her that the bacteria were resistant to Cipro.  He advised her to come to the emergency department for IV fluids, and IV antibiotics.  The patient complains of nausea and vomiting.  She denies pain anywhere.  She has not had a fever, but has had diaphoresis.  She denies a cough, or shortness of breath. Past Medical History  Diagnosis Date  . Hypertension   . Arthritis     History reviewed. No pertinent past surgical history.  No family history on file.  History  Substance Use Topics  . Smoking status: Never Smoker   . Smokeless tobacco: Not on file  . Alcohol Use: No    OB History    Grav Para Term Preterm Abortions TAB SAB Ect Mult Living                  Review of Systems  Constitutional: Positive for diaphoresis. Negative for fever.  HENT: Negative for congestion.   Respiratory: Negative for cough and shortness of breath.   Cardiovascular: Negative for chest pain.  Gastrointestinal: Positive for nausea and vomiting. Negative for abdominal pain and diarrhea.  Genitourinary: Positive for dysuria.  Neurological: Positive for weakness. Negative for headaches.  Psychiatric/Behavioral: Negative for confusion.  All other systems reviewed and are negative.    Allergies  Review of patient's allergies indicates no known allergies.  Home Medications   Current Outpatient Rx  Name Route Sig  Dispense Refill  . HYDROCHLOROTHIAZIDE 25 MG PO TABS Oral Take 25 mg by mouth See admin instructions. Take 1 tablet daily except for Sundays    . METOPROLOL TARTRATE 50 MG PO TABS Oral Take 50 mg by mouth 2 (two) times daily.    Marland Kitchen ONDANSETRON 4 MG PO TBDP Oral Take 4 mg by mouth every 8 (eight) hours as needed. For nausea    . RANITIDINE HCL 150 MG PO TABS Oral Take 150 mg by mouth 2 (two) times daily.    Marland Kitchen SIMVASTATIN 40 MG PO TABS Oral Take 40 mg by mouth every evening.    . SULFAMETHOXAZOLE-TRIMETHOPRIM 800-160 MG PO TABS Oral Take 1 tablet by mouth 2 (two) times daily.    Marland Kitchen VERAPAMIL HCL ER 180 MG PO CP24 Oral Take 180 mg by mouth daily.      BP 171/84  Pulse 72  Temp 98.4 F (36.9 C) (Oral)  Resp 24  SpO2 97%  Physical Exam  Nursing note and vitals reviewed. Constitutional: She is oriented to person, place, and time. No distress.       Frail elderly, female, in no distress  HENT:  Head: Normocephalic and atraumatic.  Eyes: Conjunctivae are normal.  Neck: Normal range of motion. Neck supple.  Cardiovascular: Normal rate.   No murmur heard. Pulmonary/Chest: Effort normal and breath sounds normal. She has no rales.  Abdominal: Soft. Bowel sounds are normal. She exhibits no distension. There is no tenderness.  Musculoskeletal: Normal  range of motion.  Neurological: She is alert and oriented to person, place, and time.  Skin: Skin is warm.  Psychiatric: She has a normal mood and affect. Thought content normal.    ED Course  Procedures (including critical care time) urinary tract infection, with nausea and vomiting.  A 78 year old, female.  We will establish an IV and give IV antibiotics.  We'll perform laboratory testing.   Labs Reviewed  CBC WITH DIFFERENTIAL  BASIC METABOLIC PANEL  URINALYSIS, ROUTINE W REFLEX MICROSCOPIC   No results found.   No diagnosis found.  9:41 PM Pt remains listless.  I will call for admission.  10:12 PM Spoke with Dr. Evlyn Kanner. He will  come admit.  ECG Normal sinus rhythm at 72 beats per minute with occasional PAC. Normal axis. Normal intervals. Normal.  ST and T waves  MDM  Urinary tract infection Hyponatremia Hypokalemia        Cheri Guppy, MD 04/19/12 0981  Cheri Guppy, MD 04/19/12 1914  Cheri Guppy, MD 05/18/12 [id number]

## 2012-04-20 ENCOUNTER — Encounter (HOSPITAL_COMMUNITY): Payer: Self-pay

## 2012-04-20 LAB — BASIC METABOLIC PANEL
BUN: 8 mg/dL (ref 6–23)
Calcium: 8.5 mg/dL (ref 8.4–10.5)
GFR calc Af Amer: 86 mL/min — ABNORMAL LOW (ref >90)
GFR calc non Af Amer: 74 mL/min — ABNORMAL LOW (ref >90)
Potassium: 3.5 mEq/L (ref 3.5–5.1)

## 2012-04-20 LAB — CBC
Hemoglobin: 11 g/dL — ABNORMAL LOW (ref 12.0–15.0)
MCHC: 34 g/dL (ref 30.0–36.0)
Platelets: 165 10*3/uL (ref 150–400)
RDW: 13 % (ref 11.5–15.5)

## 2012-04-20 MED ORDER — ONDANSETRON 4 MG PO TBDP
4.0000 mg | ORAL_TABLET | Freq: Three times a day (TID) | ORAL | Status: DC | PRN
  Filled 2012-04-20: qty 1

## 2012-04-20 MED ORDER — SIMVASTATIN 40 MG PO TABS
40.0000 mg | ORAL_TABLET | Freq: Every evening | ORAL | Status: DC
  Filled 2012-04-20: qty 1

## 2012-04-20 MED ORDER — VERAPAMIL HCL ER 180 MG PO TBCR
180.0000 mg | EXTENDED_RELEASE_TABLET | Freq: Every day | ORAL | Status: DC
  Administered 2012-04-20 – 2012-04-21 (×2): 180 mg via ORAL
  Filled 2012-04-20 (×2): qty 1

## 2012-04-20 MED ORDER — ALPRAZOLAM 0.25 MG PO TABS
0.2500 mg | ORAL_TABLET | Freq: Three times a day (TID) | ORAL | Status: DC | PRN
  Administered 2012-04-20: 0.25 mg via ORAL
  Filled 2012-04-20: qty 1

## 2012-04-20 MED ORDER — METOPROLOL TARTRATE 50 MG PO TABS
50.0000 mg | ORAL_TABLET | Freq: Two times a day (BID) | ORAL | Status: DC
  Administered 2012-04-20 – 2012-04-21 (×4): 50 mg via ORAL
  Filled 2012-04-20 (×5): qty 1

## 2012-04-20 MED ORDER — DEXTROSE 5 % IV SOLN
1.0000 g | Freq: Every day | INTRAVENOUS | Status: DC
  Administered 2012-04-20: 1 g via INTRAVENOUS
  Filled 2012-04-20: qty 10

## 2012-04-20 MED ORDER — ATORVASTATIN CALCIUM 20 MG PO TABS
20.0000 mg | ORAL_TABLET | Freq: Every day | ORAL | Status: DC
  Administered 2012-04-20: 20 mg via ORAL
  Filled 2012-04-20 (×2): qty 1

## 2012-04-20 MED ORDER — ACETAMINOPHEN 325 MG PO TABS: 650.0000 mg | ORAL_TABLET | ORAL | Status: DC | PRN

## 2012-04-20 MED ORDER — FAMOTIDINE 20 MG PO TABS
20.0000 mg | ORAL_TABLET | Freq: Two times a day (BID) | ORAL | Status: DC
  Administered 2012-04-20 – 2012-04-21 (×4): 20 mg via ORAL
  Filled 2012-04-20 (×5): qty 1

## 2012-04-20 NOTE — Progress Notes (Signed)
Subjective: Had a good night. No chills or sweats. Eating and drinking well.   Objective: Vital signs in last 24 hours: Temp:  [97.8 F (36.6 C)-98.4 F (36.9 C)] 98.3 F (36.8 C) (07/27 0539) Pulse Rate:  [67-72] 67  (07/27 0539) Resp:  [20-24] 20  (07/27 0539) BP: (158-171)/(81-84) 167/81 mmHg (07/27 0539) SpO2:  [97 %-99 %] 99 % (07/27 0539) Weight:  [57.7 kg (127 lb 3.3 oz)] 57.7 kg (127 lb 3.3 oz) (07/27 0022)  Intake/Output from previous day: 07/26 0701 - 07/27 0700 In: -  Out: 325 [Urine:325] Intake/Output this shift:    General: alert, sitting up, drinking liquid, mentating well. Lungs clear. Ht regular with murmur abd soft NT  Lab Results   [facility] 04/20/12 0523 04/19/12 2002  WBC 8.5 12.1*  RBC 3.69* 4.32  HGB 11.0* 13.0  HCT 32.4* 37.3  MCV 87.8 86.3  MCH 29.8 30.1  RDW 13.0 12.7  PLT 165 196    Basename 04/20/12 0523 04/19/12 2002  NA 133* 133*  K 3.5 3.2*  CL 100 95*  CO2 24 27  GLUCOSE 94 101*  BUN 8 11  CREATININE 0.67 0.74  CALCIUM 8.5 9.1    Studies/Results: No results found.  Scheduled Meds:   . aspirin  81 mg Oral Daily  . cefTRIAXone (ROCEPHIN)  IV  1 g Intravenous Once  . cefTRIAXone (ROCEPHIN)  IV  1 g Intravenous QHS  . enoxaparin (LOVENOX) injection  30 mg Subcutaneous QHS  . famotidine  20 mg Oral BID  . gi cocktail  30 mL Oral Once  . metoprolol  50 mg Oral BID  . ondansetron      . potassium chloride  10 mEq Intravenous Q1 Hr x 3  . simvastatin  40 mg Oral QPM  . verapamil  180 mg Oral Daily  . DISCONTD: cefTRIAXone (ROCEPHIN)  IV  1 g Intravenous Q24H   Continuous Infusions:   . sodium chloride 100 mL/hr at 04/20/12 0014  . DISCONTD: sodium chloride 125 mL/hr (04/19/12 1951)   PRN Meds:acetaminophen, ondansetron  Assessment/Plan:  *Pyelonephritis, acute :did well overnight. WBC down to 8 Active Problems:  Unspecified essential hypertension: BP fair  Other and unspecified hyperlipidemia: continue Rx    Dehydration: hydrate gently  Weakness generalized : improved Malignant neoplasm of ovary: distant hx  Malignant neoplasm of breast (female), unspecified site: distant hx  Mitral valve insufficiency and aortic valve insufficiency  Osteoarthrosis, unspecified whether generalized or localized, unspecified site  Osteopenia: on vit D  Unspecified transient cerebral ischemia: prior TIA, cont ASA  Rheumatoid arthritis: not on chronic steroids or DMARDs  Esophageal reflux: continue H2 blockers  Abnormality of gait: at risk for falls  Hypokalemia:  Better at 3.5 DVT proph: lovenox  Full code    LOS: 1 day   Alexianna Nachreiner ALAN 04/20/2012, 9:52 AM

## 2012-04-20 NOTE — Progress Notes (Signed)
PHARMACIST - PHYSICIAN COMMUNICATION  DESCRIPTION:   Patients on verapamil and simvastatin >20mg /day have reported cases of rhabdomyolysis. Per P/T Policy, if simvastatin dose is > 20 mg, substitute atorvastatin (Lipitor) 1mg  for each 2mg  simvastatin and leave communication form advising MD of change.  Thank you.    If you have questions about this conversion, please contact the Pharmacy Department  []   [phone] )  [facility] []   [phone] )  [facility]  []   [phone] )  [facility] [x]   [phone] )  [facility]

## 2012-04-21 ENCOUNTER — Encounter (HOSPITAL_COMMUNITY): Payer: Self-pay | Admitting: *Deleted

## 2012-04-21 MED ORDER — ASPIRIN 81 MG PO CHEW: 81.0000 mg | CHEWABLE_TABLET | Freq: Every day | ORAL | Status: DC

## 2012-04-21 NOTE — Discharge Summary (Signed)
DISCHARGE SUMMARY  Alexa Rowe  MR#: [id number]  DOB:[birth year]  Date of Admission: 04/19/2012 Date of Discharge: 04/21/2012  Attending Physician:Alexa Rowe Alexa Rowe  Patient's GEX:BMWUX,[name], MD  Consults:  none  Discharge Diagnoses: Principal Problem:  *Pyelonephritis, acute, due to E. coli, improved Active Problems:  Unspecified essential hypertension, stable  Other and unspecified hyperlipidemia, on stable medication  Dehydration, improved  Weakness generalized, improved  Malignant neoplasm of ovary, no evidence of return  Malignant neoplasm of breast (female), unspecified site, no evidence of disease   Mitral valve insufficiency and aortic valve insufficiency, asymptomatic  Osteoarthrosis, unspecified whether generalized or localized, unspecified site Osteopenia  Unspecified transient cerebral ischemia, neurologically stable  Rheumatoid arthritis, stable  Esophageal reflux, on H2 blockers  Abnormality of gait, stable  Hypokalemia, resolved  Hyponatremia, mild   Discharge Medications: Medication List  As of 04/21/2012 10:08 AM   TAKE these medications         aspirin 81 MG chewable tablet   Chew 1 tablet (81 mg total) by mouth daily.      hydrochlorothiazide 25 MG tablet   Commonly known as: HYDRODIURIL   Take 25 mg by mouth See admin instructions. Take 1 tablet daily except for Sundays      metoprolol 50 MG tablet   Commonly known as: LOPRESSOR   Take 50 mg by mouth 2 (two) times daily.      ondansetron 4 MG disintegrating tablet   Commonly known as: ZOFRAN-ODT   Take 4 mg by mouth every 8 (eight) hours as needed. For nausea      ranitidine 150 MG tablet   Commonly known as: ZANTAC   Take 150 mg by mouth 2 (two) times daily.      simvastatin 40 MG tablet   Commonly known as: ZOCOR   Take 40 mg by mouth every evening.      sulfamethoxazole-trimethoprim 800-160 MG per tablet   Commonly known as: BACTRIM DS,SEPTRA DS   Take 1 tablet by mouth 2  (two) times daily.      verapamil 180 MG 24 hr capsule   Commonly known as: VERELAN PM   Take 180 mg by mouth daily.            Hospital Procedures: No results found.  History of Present Illness: Fever and weakness  Hospital Course: This is a 78 year old white female presented with weakness dehydration and fever. She had been under treatment with a course of ciprofloxacin for a urinary tract infection. The culture had returned showing a resistance to Cipro and Bactrim had been prescribed. The patient became weak and nauseous and unable to ambulate and had a fever and was brought to the emergency room. She received ceftriaxone and fluids and has done much better with no further fever and has had resolution of her leukocytosis. She's had no chills or sweats. She is back eating. She's been ambulated. She has no pain. She did have a bit of confusion last night has resolved. She's back to her baseline per family. Plan is to finish out a total of 7 days of antibiotics now switching to oral Bactrim which the culture showed the bacteria was sensitive to. She's had no cardiac or pulmonary issues. She's had no pain issues. She has had no ill effects of the treatment. Blood pressure is stable. Hypokalemia has been replaced.  Day of Discharge Exam BP 166/86  Pulse 72  Temp 97.6 F (36.4 C) (Oral)  Resp 18  Ht 5\' 3"  (1.6 m)  Wt 57.7 kg (127 lb 3.3 oz)  BMI 22.53 kg/m2  SpO2 95%  Physical Exam: General appearance: alert, sitting up in no distress looking bright and happy. Face is symmetric oral mucous members are moist dentition is in good repair  Resp: clear to auscultation bilaterally. No wheezes rales or rhonchi are present Cardio: regular rate and rhythm, systolic murmur is present. GI: soft, non-tender; bowel sounds normal; no masses,  no organomegaly Extremities: no clubbing, cyanosis or edema Significant deformity of the toes is noted. She's alert awake mentating well speech is clear  and fluent no tremor is   Present. Skin turgor is good and there are no rashes.  Discharge Labs:  [facility] 04/20/12 0523 04/19/12 2002  NA 133* 133*  K 3.5 3.2*  CL 100 95*  CO2 24 27  GLUCOSE 94 101*  BUN 8 11  CREATININE 0.67 0.74  CALCIUM 8.5 9.1  MG -- --  PHOS -- --      Basename 04/20/12 0523 04/19/12 2002  WBC 8.5 12.1*  NEUTROABS -- 10.2*  HGB 11.0* 13.0  HCT 32.4* 37.3  MCV 87.8 86.3  PLT 165 196    Results for Alexa Rowe, Alexa Rowe (MRN [id number]) as of 04/21/2012 10:09  Ref. Range 04/19/2012 19:36  Color, Urine Latest Range: YELLOW  YELLOW  APPearance Latest Range: CLEAR  CLOUDY (A)  Specific Gravity, Urine Latest Range: 1.005-1.030  1.015  pH Latest Range: 5.0-8.0  7.0  Glucose Latest Range: NEGATIVE mg/dL NEGATIVE  Bilirubin Urine Latest Range: NEGATIVE  NEGATIVE  Ketones, ur Latest Range: NEGATIVE mg/dL NEGATIVE  Protein Latest Range: NEGATIVE mg/dL NEGATIVE  Urobilinogen, UA Latest Range: 0.0-1.0 mg/dL 0.2  Nitrite Latest Range: NEGATIVE  NEGATIVE  Leukocytes, UA Latest Range: NEGATIVE  LARGE (A)  Hgb urine dipstick Latest Range: NEGATIVE  SMALL (A)  WBC, UA Latest Range: <3 WBC/hpf TOO NUMEROUS TO COUNT  RBC / HPF Latest Range: <3 RBC/hpf 0-2  Squamous Epithelial / LPF Latest Range: RARE  RARE  Bacteria, UA Latest Range: RARE  MANY (A)    Discharge instructions: Call if you have fever or or other difficulties   Disposition: Back to assisted living and  Follow-up Appts: Follow-up with Dr. Timothy Rowe at [facility] in 2 weeks  Call for appointment.  Condition on Discharge: Improved  Tests Needing Follow-up: None  Signed: Mashayla Rowe Alexa Rowe 04/21/2012, 10:08 AM

## 2012-05-26 HISTORY — PX: TOE AMPUTATION: SHX809

## 2012-06-28 ENCOUNTER — Emergency Department (HOSPITAL_COMMUNITY): Payer: Medicare Other

## 2012-06-28 ENCOUNTER — Inpatient Hospital Stay (HOSPITAL_COMMUNITY)
Admission: EM | Admit: 2012-06-28 | Discharge: 2012-07-02 | DRG: 062 | Disposition: A | Discharge status: Skilled Nursing Facility | Payer: Medicare Other | Attending: Internal Medicine | Admitting: Internal Medicine

## 2012-06-28 DIAGNOSIS — M069 Rheumatoid arthritis, unspecified: Secondary | ICD-10-CM | POA: Diagnosis present

## 2012-06-28 DIAGNOSIS — F0390 Unspecified dementia without behavioral disturbance: Secondary | ICD-10-CM | POA: Diagnosis present

## 2012-06-28 DIAGNOSIS — E876 Hypokalemia: Secondary | ICD-10-CM | POA: Diagnosis present

## 2012-06-28 DIAGNOSIS — I639 Cerebral infarction, unspecified: Secondary | ICD-10-CM

## 2012-06-28 DIAGNOSIS — R4182 Altered mental status, unspecified: Secondary | ICD-10-CM | POA: Diagnosis present

## 2012-06-28 DIAGNOSIS — K219 Gastro-esophageal reflux disease without esophagitis: Secondary | ICD-10-CM | POA: Diagnosis present

## 2012-06-28 DIAGNOSIS — Z8543 Personal history of malignant neoplasm of ovary: Secondary | ICD-10-CM

## 2012-06-28 DIAGNOSIS — N318 Other neuromuscular dysfunction of bladder: Secondary | ICD-10-CM | POA: Diagnosis present

## 2012-06-28 DIAGNOSIS — Z853 Personal history of malignant neoplasm of breast: Secondary | ICD-10-CM

## 2012-06-28 DIAGNOSIS — E871 Hypo-osmolality and hyponatremia: Secondary | ICD-10-CM | POA: Diagnosis present

## 2012-06-28 DIAGNOSIS — R471 Dysarthria and anarthria: Secondary | ICD-10-CM | POA: Diagnosis present

## 2012-06-28 DIAGNOSIS — I6350 Cerebral infarction due to unspecified occlusion or stenosis of unspecified cerebral artery: Principal | ICD-10-CM | POA: Diagnosis present

## 2012-06-28 DIAGNOSIS — M1990 Unspecified osteoarthritis, unspecified site: Secondary | ICD-10-CM | POA: Diagnosis present

## 2012-06-28 DIAGNOSIS — E785 Hyperlipidemia, unspecified: Secondary | ICD-10-CM | POA: Diagnosis present

## 2012-06-28 DIAGNOSIS — G459 Transient cerebral ischemic attack, unspecified: Secondary | ICD-10-CM

## 2012-06-28 DIAGNOSIS — I10 Essential (primary) hypertension: Secondary | ICD-10-CM | POA: Diagnosis present

## 2012-06-28 LAB — COMPREHENSIVE METABOLIC PANEL
ALT: 12 U/L (ref 0–35)
AST: 19 U/L (ref 0–37)
Alkaline Phosphatase: 61 U/L (ref 39–117)
CO2: 24 mEq/L (ref 19–32)
GFR calc Af Amer: 72 mL/min — ABNORMAL LOW (ref >90)
GFR calc non Af Amer: 62 mL/min — ABNORMAL LOW (ref >90)
Glucose, Bld: 99 mg/dL (ref 70–99)
Potassium: 4.1 mEq/L (ref 3.5–5.1)
Sodium: 132 mEq/L — ABNORMAL LOW (ref 135–145)

## 2012-06-28 LAB — DIFFERENTIAL
Lymphocytes Relative: 31 % (ref 12–46)
Lymphs Abs: 2.4 10*3/uL (ref 0.7–4.0)
Neutro Abs: 4.5 10*3/uL (ref 1.7–7.7)
Neutrophils Relative %: 58 % (ref 43–77)

## 2012-06-28 LAB — URINALYSIS, MICROSCOPIC ONLY
Glucose, UA: NEGATIVE mg/dL
Ketones, ur: NEGATIVE mg/dL
Leukocytes, UA: NEGATIVE
Nitrite: NEGATIVE
Specific Gravity, Urine: 1.009 (ref 1.005–1.030)
pH: 7 (ref 5.0–8.0)

## 2012-06-28 LAB — CBC
Platelets: 209 10*3/uL (ref 150–400)
RBC: 4.44 MIL/uL (ref 3.87–5.11)
WBC: 7.8 10*3/uL (ref 4.0–10.5)

## 2012-06-28 LAB — PROTIME-INR
INR: 0.96 (ref 0.00–1.49)
Prothrombin Time: 12.7 seconds (ref 11.6–15.2)

## 2012-06-28 LAB — APTT: aPTT: 31 seconds (ref 24–37)

## 2012-06-28 LAB — POCT I-STAT, CHEM 8
BUN: 21 mg/dL (ref 6–23)
Creatinine, Ser: 0.9 mg/dL (ref 0.50–1.10)
Glucose, Bld: 96 mg/dL (ref 70–99)
Hemoglobin: 13.9 g/dL (ref 12.0–15.0)
Potassium: 4.2 mEq/L (ref 3.5–5.1)
TCO2: 25 mmol/L (ref 0–100)

## 2012-06-28 MED ORDER — SODIUM CHLORIDE 0.9 % IV SOLN: 250.0000 mL | INTRAVENOUS | Status: DC | PRN

## 2012-06-28 MED ORDER — ALTEPLASE (STROKE) FULL DOSE INFUSION
52.0000 mg | INTRAVENOUS | Status: AC
  Administered 2012-06-28: 52 mg via INTRAVENOUS
  Filled 2012-06-28: qty 52

## 2012-06-28 MED ORDER — LABETALOL HCL 5 MG/ML IV SOLN: 10.0000 mg | INTRAVENOUS | Status: DC | PRN

## 2012-06-28 MED ORDER — LABETALOL HCL 5 MG/ML IV SOLN
10.0000 mg | Freq: Once | INTRAVENOUS | Status: AC
  Administered 2012-06-28: 10 mg via INTRAVENOUS

## 2012-06-28 MED ORDER — PANTOPRAZOLE SODIUM 40 MG IV SOLR
40.0000 mg | Freq: Every day | INTRAVENOUS | Status: DC
  Administered 2012-06-29: 40 mg via INTRAVENOUS
  Filled 2012-06-28 (×3): qty 40

## 2012-06-28 MED ORDER — LABETALOL HCL 5 MG/ML IV SOLN
INTRAVENOUS | Status: AC
  Filled 2012-06-28: qty 4

## 2012-06-28 NOTE — ED Notes (Signed)
Patient does not follow commands, recgonizes her name, Dr Otelia Limes at bedside.  Patient is very anxious at this time

## 2012-06-28 NOTE — ED Notes (Signed)
Family insists that the patient sit up in the bed after this nurse explained that we need to have her laying down flat.  Family stated that she has reflux and has trouble laying down flat.

## 2012-06-28 NOTE — ED Notes (Signed)
Dr Otelia Limes paged re pt's HTN, 10 mg Labetolol TO VBRB,

## 2012-06-28 NOTE — ED Notes (Signed)
Pt. From [place] [facility]. Family at bedside ~ 1730 when symptoms unfolded. Inc. Confusion. Having trouble pointing at the right day of the week.

## 2012-06-28 NOTE — ED Notes (Signed)
I stat results given to Dr. Preston Fleeting by B. Bing Plume, EMT

## 2012-06-28 NOTE — Consult Note (Signed)
[facility] CONSULT NOTE     Reason for Consult: Code Stroke. CC: Sudden onset aphasia.    HPI:    Alexa Rowe is an 78 y.o. female who was in her USOH today until 5:45 PM, when she developed sudden onset of what the family describes as confusion. At her baseline, she is independent with normal speech. 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 Specific features of the apparent confusion included inability to follow verbal commands, paraphasias, and incomprehensible speech. The patient could at times verbalize short, understandable statements fluently, but not in relation to direct questions and apparently only as a response to internal stimuli, with no indication of a clear thought process. She was transported to [facility] and code stroke initiated. She is not on anticoagulants at home, per family.   Her PMHx includes HTN, dyslipidemia and remote history of ovarian and breast cancer. Her systolic blood pressure in the ED was as high as the low 200's and was brought below 185 with IV labetalol.   CT was personally reviewed in conjunction with the official radiology report. No hemorrhage or other acute findings noted.    Past Medical History  Diagnosis Date  . Hypertension   . Arthritis     No past surgical history on file.  No family history on file.  Social History:  reports that she has never smoked. She does not have any smokeless tobacco history on file. She reports that she does not drink alcohol. Her drug history not on file.  No Known Allergies  Medications:    Prior to Admission:  (Not in a hospital admission) Scheduled:   . alteplase  52 mg Intravenous STAT    Review of Systems - Unable to obtain from patient due to aphasia. Family states no fever, diaphoresis or chills. No evidence of pain per family, but they stated that she  was anxious.   Blood pressure 153/96, pulse 86, resp. rate 18, SpO2 100.00%.   Neurologic Examination:   Mental Status: Awake, alert. Unable to follow any verbal commands. Poor attention - will visually fixate on examiner or other individuals only for 1-2 seconds at a time, then becomes distracted. She appears anxious. Occasional fluent speech consisting of single words or short phrases, essentially all of which are exclamations and largely devoid of thought content. Fluent unintelligible phrases consistent with word salad noted several times during exam. Unable to name objects. Unable to follow any verbal commands. No purposeful movements except for grabbing at her arms and clothing, attempting to rise from the bed, and holding on to hands of examiner or family.   Cranial Nerves: II-PERRL. Attends briefly to visual stimuli. No gross visual field cut to threat. II/IV/VI-Able to gaze fully to left and right in response to repeated loud visual and verbal stimuli. No nystagmus. No ptosis.  V/VII- Subtle decreased prominence of her nasolabial fold on the right. Decreased width of the aperture of the mouth on the right relative to the left. Upper quadrants of face symmetric, with normal eye closure bilaterally. Blinks eyes to light touch in periorbital region (V2) bilaterally.  VIII-Responds only to loud verbal stimuli.  IX/X- No hypophonia or hoarseness noted.  XI- No head tilt or preferential rotation of the head to left or right. Shoulders are symmetric.  XII-Does not protrude tongue to command. Tongue moves to left and right equally when she spontaneously licks her lips.  Motor/Sensory: 4+/5 in all 4 extremities proximally and distally. Able to keep each arm and leg raised above the bed in response to light pinching along the posterior aspect of each limb, for greater than 5 seconds in lower extremities and 10 seconds in upper extremities. Spontaneous movements of upper extremities appear  symmetrically distributed. Tone is normal throughout. Bulk is decreased in upper extremities, consistent with her age.   DTR's: Normoactive with great toes downgoing.  Cerebellar: No ataxia noted with spontaneous movements.  Gait: Deferred.    No results found for this basename: cbc, bmp, coags, chol, tri, ldl, hga1c    Results for orders placed during the hospital encounter of 06/28/12 (from the past 48 hour(s))  PROTIME-INR     Status: Normal   Collection Time   06/28/12  7:06 PM      Component Value Range Comment   Prothrombin Time 12.7  11.6 - 15.2 seconds    INR 0.96  0.00 - 1.49   APTT     Status: Normal   Collection Time   06/28/12  7:06 PM      Component Value Range Comment   aPTT 31  24 - 37 seconds   CBC     Status: Normal   Collection Time   06/28/12  7:06 PM      Component Value Range Comment   WBC 7.8  4.0 - 10.5 K/uL    RBC 4.44  3.87 - 5.11 MIL/uL    Hemoglobin 13.2  12.0 - 15.0 g/dL    HCT 16.1  09.6 - 04.5 %    MCV 86.5  78.0 - 100.0 fL    MCH 29.7  26.0 - 34.0 pg    MCHC 34.4  30.0 - 36.0 g/dL    RDW 40.9  81.1 - 91.4 %    Platelets 209  150 - 400 K/uL   DIFFERENTIAL     Status: Normal   Collection Time   06/28/12  7:06 PM      Component Value Range Comment   Neutrophils Relative 58  43 - 77 %    Neutro Abs 4.5  1.7 - 7.7 K/uL    Lymphocytes Relative 31  12 - 46 %    Lymphs Abs 2.4  0.7 - 4.0 K/uL    Monocytes Relative 9  3 - 12 %    Monocytes Absolute 0.7  0.1 - 1.0 K/uL    Eosinophils Relative 2  0 - 5 %    Eosinophils Absolute 0.2  0.0 - 0.7 K/uL    Basophils Relative 1  0 - 1 %    Basophils Absolute 0.0  0.0 - 0.1 K/uL   COMPREHENSIVE METABOLIC PANEL     Status: Abnormal   Collection Time   06/28/12  7:06 PM      Component Value Range Comment   Sodium 132 (*) 135 - 145 mEq/L    Potassium 4.1  3.5 - 5.1 mEq/L    Chloride 98  96 - 112 mEq/L    CO2 24  19 - 32 mEq/L    Glucose, Bld 99  70 - 99 mg/dL    BUN 18  6 - 23 mg/dL    Creatinine, Ser  7.82  0.50 - 1.10 mg/dL    Calcium 95.6  8.4 - 10.5 mg/dL    Total Protein 6.9  6.0 -  8.3 g/dL    Albumin 4.0  3.5 - 5.2 g/dL    AST 19  0 - 37 U/L    ALT 12  0 - 35 U/L    Alkaline Phosphatase 61  39 - 117 U/L    Total Bilirubin 0.3  0.3 - 1.2 mg/dL    GFR calc non Af Amer 62 (*) >90 mL/min    GFR calc Af Amer 72 (*) >90 mL/min   TROPONIN I     Status: Normal   Collection Time   06/28/12  7:06 PM      Component Value Range Comment   Troponin I <0.30  <0.30 ng/mL   GLUCOSE, CAPILLARY     Status: Normal   Collection Time   06/28/12  7:13 PM      Component Value Range Comment   Glucose-Capillary 99  70 - 99 mg/dL   POCT I-STAT TROPONIN I     Status: Normal   Collection Time   06/28/12  7:16 PM      Component Value Range Comment   Troponin i, poc 0.00  0.00 - 0.08 ng/mL    Comment 3            POCT I-STAT, CHEM 8     Status: Normal   Collection Time   06/28/12  7:18 PM      Component Value Range Comment   Sodium 136  135 - 145 mEq/L    Potassium 4.2  3.5 - 5.1 mEq/L    Chloride 100  96 - 112 mEq/L    BUN 21  6 - 23 mg/dL    Creatinine, Ser 1.61  0.50 - 1.10 mg/dL    Glucose, Bld 96  70 - 99 mg/dL    Calcium, Ion 0.96  0.45 - 1.30 mmol/L    TCO2 25  0 - 100 mmol/L    Hemoglobin 13.9  12.0 - 15.0 g/dL    HCT 40.9  81.1 - 91.4 %     Ct Head Wo Contrast  06/28/2012  *RADIOLOGY REPORT*  Clinical Data: Code stroke.  Hypertension.  Combative.  CT HEAD WITHOUT CONTRAST  Technique:  Contiguous axial images were obtained from the base of the skull through the vertex without contrast.  Comparison: None.  Findings: There is moderate central cortical atrophy. Periventricular white matter changes are consistent with small vessel disease. There is no evidence for hemorrhage, mass lesion, or acute infarction.  Bone windows are unremarkable.  There is dense atherosclerotic calcification of the internal carotid arteries.  IMPRESSION:  1.  Atrophy and small vessel disease. 2. No evidence for acute  intracranial abnormality.  Critical test results telephoned to Dr. Colbert Coyer at the time of interpretation on date 06/28/2012 at time 7:19 p.m.   Original Report Authenticated By: Patterson Hammersmith, M.D.      Assessment/Plan:   Assessment: 1. Acute onset receptive aphasia. The patient's examination best localizes as a left perisylvian acute ischemic stroke. She falls within the 3 hour window for tPA administration, with no exclusion criteria.  2. Discussed risks versus benefits of IV tPA in comparison to risks/benefits of no tPA with family. Patient not competent to make own decisions due to aphasia. Family advised of tPA risks including death and severe disability secondary to ICH. Family educated regarding greater probability of fully or partially recovered speech with tPA administration at the cost of an increased risk of ICH and death. The family expressed understanding and desire to proceed with tPA.  Plan: 1. tPA to be administered as per acute stroke protocol. SBP to be below 185 and diastolic BP to be below 110 prior and during tPA administration. BP control with labetalol as first line treatment.  2. Post-tPA order set, including admission to MICU with frequent neuro checks. No antiplatelet medications or anticoagulants for 24 hours following tPA. DVT prophylaxis with SCDs. Repeat CT in 24 hours or sooner if patient's neurological condition deteriorates.  3. Blood pressure control algorithm -   Monitor blood pressure for the first 24 hours after starting treatment as follows:    Every 15 minutes for 2 hours after starting the infusion, then    Every 30 minutes for 6 hours, then    Every hour for 18 hours.   If SBP > 230 mm Hg or DBP is 121-140 mm Hg, give labetalol 20 mg intravenously over 1 to 2 minutes. The dose may be repeated and/or doubled every 10 minutes, up to 150 mg. If BP not controlled with labetalol, recommend nicardipine gtt.    If systolic BP is 180 to 230 mm Hg or  diastolic BP is 105 to 120 mm Hg on two readings 5 to 10 minutes apart, give labetalol 10 mg intravenously over 1 to 2 minutes. The dose may be repeated or doubled every 10 to 20 minutes, up to 150 mg.  If BP not controlled with labetalol, recommend nicardipine gtt.    As an alternative to the above protocols, following the first bolus of labetalol, an intravenous infusion of 2 to 8 mg/min labetalol may be initiated and continued until the desired blood pressure is reached. Monitor blood pressure every 15 minutes during the antihypertensive therapy. Observe for hypotension.  Call neurology if diastolic BP > 140 mm Hg. 4. PT/OT/Speech.  5. MRI of brain, MRA of head, carotid ultrasound, TTE.  6. Start atorvastatin, 40 mg po qd.    Electronically signed: Dr. Caryl Pina   06/28/2012, 7:46 PM

## 2012-06-29 ENCOUNTER — Inpatient Hospital Stay (HOSPITAL_COMMUNITY): Payer: Medicare Other

## 2012-06-29 DIAGNOSIS — I6350 Cerebral infarction due to unspecified occlusion or stenosis of unspecified cerebral artery: Principal | ICD-10-CM

## 2012-06-29 DIAGNOSIS — I10 Essential (primary) hypertension: Secondary | ICD-10-CM

## 2012-06-29 DIAGNOSIS — I639 Cerebral infarction, unspecified: Secondary | ICD-10-CM | POA: Diagnosis present

## 2012-06-29 LAB — BASIC METABOLIC PANEL
Chloride: 97 mEq/L (ref 96–112)
GFR calc Af Amer: 85 mL/min — ABNORMAL LOW (ref >90)
GFR calc non Af Amer: 74 mL/min — ABNORMAL LOW (ref >90)
Glucose, Bld: 100 mg/dL — ABNORMAL HIGH (ref 70–99)
Potassium: 3.4 mEq/L — ABNORMAL LOW (ref 3.5–5.1)
Sodium: 134 mEq/L — ABNORMAL LOW (ref 135–145)

## 2012-06-29 LAB — PHOSPHORUS: Phosphorus: 3.5 mg/dL (ref 2.3–4.6)

## 2012-06-29 LAB — MRSA PCR SCREENING: MRSA by PCR: NEGATIVE

## 2012-06-29 LAB — CBC
HCT: 35 % — ABNORMAL LOW (ref 36.0–46.0)
Hemoglobin: 12 g/dL (ref 12.0–15.0)
MCHC: 34.3 g/dL (ref 30.0–36.0)
RDW: 13.4 % (ref 11.5–15.5)
WBC: 11 10*3/uL — ABNORMAL HIGH (ref 4.0–10.5)

## 2012-06-29 MED ORDER — LORAZEPAM 2 MG/ML IJ SOLN
1.0000 mg | Freq: Three times a day (TID) | INTRAMUSCULAR | Status: DC
  Administered 2012-06-29: 1 mg via INTRAVENOUS
  Filled 2012-06-29: qty 1

## 2012-06-29 MED ORDER — POTASSIUM CHLORIDE CRYS ER 20 MEQ PO TBCR
40.0000 meq | EXTENDED_RELEASE_TABLET | Freq: Once | ORAL | Status: AC
  Administered 2012-06-29: 40 meq via ORAL
  Filled 2012-06-29: qty 2

## 2012-06-29 MED ORDER — LORAZEPAM 2 MG/ML IJ SOLN
1.0000 mg | Freq: Once | INTRAMUSCULAR | Status: AC
  Administered 2012-06-29: 1 mg via INTRAVENOUS
  Filled 2012-06-29: qty 1

## 2012-06-29 MED ORDER — ASPIRIN 325 MG PO TABS
325.0000 mg | ORAL_TABLET | Freq: Once | ORAL | Status: AC
  Administered 2012-06-29: 325 mg via ORAL
  Filled 2012-06-29: qty 1

## 2012-06-29 NOTE — Progress Notes (Signed)
Name: Alexa Rowe MRN: [id number] DOB: [birth year]    LOS: 1  PULMONARY / CRITICAL CARE MEDICINE  HPI:   78 years old female with PMH relevant for HTN, dyslipidemia and remote history of ovarian and breast cancer. Admitted with acute CVA s/p TPA.   Current Status: Awake and alert. On RA  Vital Signs: Temp:  [97.4 F (36.3 C)-100 F (37.8 C)] 97.9 F (36.6 C) (10/05 0400) Pulse Rate:  [52-91] 83  (10/05 0900) Resp:  [11-29] 22  (10/05 0900) BP: (114-216)/(54-164) 121/90 mmHg (10/05 0800) SpO2:  [94 %-100 %] 97 % (10/05 0900)  Physical Examination: General:  No acute distress Neuro:  Awake, less confused  moves all 4 extremities, no facial droop. HEENT:  PERRL, pink conjunctivae, moist membranes Neck:  Supple, no JVD   Cardiovascular:  RRR, no M/R/G Lungs:  Adequate air entry bilaterally, no W/R/R Abdomen:  Soft, nontender, nondistended, bowel sounds present Musculoskeletal:  Moves all extremities, no pedal edema, 2nd toe amputation bilaterally. Skin:  No rash  Active Problems:  CVA (cerebral infarction)   ASSESSMENT AND PLAN  PULMONARY No results found for this basename: PHART:5,PCO2:5,PCO2ART:5,PO2ART:5,HCO3:5,O2SAT:5 in the last 168 hours On RA    CXR: NAD  A:   1) No issues. Able to protect her airway P:   - Supplemental O2 via [place] as needed.  CARDIOVASCULAR  Lab 06/28/12 1906  TROPONINI <0.30  LATICACIDVEN --  PROBNP --   ECG:  Atrial pacing Lines: Peripheral lines  A:  1) Hypertensive with adequate response to labetalol 10 mg IVP P:  - Will continue labetalol 10 mg IVP q2hrs PRN with goal of SBP 160 to 180 and diastolic < 105  RENAL  Lab 06/29/12 0435 06/28/12 1918 06/28/12 1906  NA 134* 136 132*  K 3.4* 4.2 --  CL 97 100 98  CO2 27 -- 24  BUN 14 21 18   CREATININE 0.68 0.90 0.80  CALCIUM 9.9 -- 10.0  MG 1.7 -- --  PHOS 3.5 -- --   Intake/Output      10/04 0701 - 10/05 0700 10/05 0701 - 10/06 0700   I.V. 80 20   Total Intake 80  20   Urine 200 60   Total Output 200 60   Net -120 -40         Foley:  06/28/12  A:   No issues P:   - Monitor BMP  GASTROINTESTINAL  Lab 06/28/12 1906  AST 19  ALT 12  ALKPHOS 61  BILITOT 0.3  PROT 6.9  ALBUMIN 4.0    A:   1) No issues P:   GI prophylaxis with Protonix Swallow eval  HEMATOLOGIC  Lab 06/29/12 0435 06/28/12 1918 06/28/12 1906  HGB 12.0 13.9 13.2  HCT 35.0* 41.0 38.4  PLT 190 -- 209  INR -- -- 0.96  APTT -- -- 31   A:   1) No issues P:  - Follow up CBC  INFECTIOUS  Lab 06/29/12 0435 06/28/12 1906  WBC 11.0* 7.8  PROCALCITON -- --    A:   1) No evidence of acute infectious process P:   - No antibiotics indicated.  ENDOCRINE  Lab 06/28/12 1913  GLUCAP 99   A:   1) No history of diabetes  NEUROLOGIC  A:   1) Acute ischemic stroke P:   - Post TPA - Echocardiogram ordered - Carotid doppler ordered. - Neuro checks q2hrs - Speech / swallow evaluation in am - PT/OT consult  BEST PRACTICE /  DISPOSITION - Level of Care:  ICU - Primary Service:  PCCM - Consultants:  Neurology - Code Status:  Full code - Diet:  NPO - DVT Px: SCD's - GI Px:  IV protonix - Skin Integrity:  Intact - Social / Family: no family at bedside AM 10/5  The patient is critically ill with multiple organ systems failure and requires high complexity decision making for assessment and support, frequent evaluation and titration of therapies, application of advanced monitoring technologies and extensive interpretation of multiple databases.   Critical Care Time devoted to patient care services described in this note is: 30 min   Caryl Bis  [phone]  Cell  [phone]  If no response or cell goes to voicemail, call beeper [phone]   06/29/2012, 9:23 AM

## 2012-06-29 NOTE — Progress Notes (Signed)
Stroke Team Progress Note  HISTORY Alexa Rowe is an 78 y.o. female who was in her USOH today until 5:45 PM, when she developed sudden onset of what the family describes as confusion. At her baseline, she is independent with normal speech. 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 Specific features of the apparent confusion included inability to follow verbal commands, paraphasias, and incomprehensible speech. The patient could at times verbalize short, understandable statements fluently, but not in relation to direct questions and apparently only as a response to internal stimuli, with no indication of a clear thought process. She was transported to [facility] and code stroke initiated. She is not on anticoagulants at home, per family.  Her PMHx includes HTN, dyslipidemia and remote history of ovarian and breast cancer. Her systolic blood pressure in the ED was as high as the low 200's and was brought below 185 with IV labetalol.    SUBJECTIVE No family is at bedside. Overall she feels her condition is improved somewhat, aphasia is better, not resolved. No complaint of a headache.  OBJECTIVE Most recent Vital Signs: Temp: 97.9 F (36.6 C) (10/05 0400) Temp src: Oral (10/05 0400) BP: 121/90 mmHg (10/05 0800) Pulse Rate: 83  (10/05 0900) Respiratory Rate: 22 O2 Saturation: 97%  CBG (last 3)  Basename 06/28/12 1913  GLUCAP 99   Intake/Output from previous day: 10/04 0701 - 10/05 0700 In: 80 [I.V.:80] Out: 200 [Urine:200]  IV Fluid Intake:    Medications    . alteplase  52 mg Intravenous STAT  . labetalol      . labetalol  10 mg Intravenous Once  . pantoprazole (PROTONIX) IV  40 mg Intravenous QHS  PRN sodium chloride, labetalol  Diet:  NPO  Activity:  Bedrest  DVT Prophylaxis: SCD  Significant Diagnostic Studies: CBC    Component Value Date/Time   WBC 11.0* 06/29/2012  0435   RBC 4.08 06/29/2012 0435   HGB 12.0 06/29/2012 0435   HCT 35.0* 06/29/2012 0435   PLT 190 06/29/2012 0435   MCV 85.8 06/29/2012 0435   MCH 29.4 06/29/2012 0435   MCHC 34.3 06/29/2012 0435   RDW 13.4 06/29/2012 0435   LYMPHSABS 2.4 06/28/2012 1906   MONOABS 0.7 06/28/2012 1906   EOSABS 0.2 06/28/2012 1906   BASOSABS 0.0 06/28/2012 1906   CMP    Component Value Date/Time   NA 134* 06/29/2012 0435   K 3.4* 06/29/2012 0435   CL 97 06/29/2012 0435   CO2 27 06/29/2012 0435   GLUCOSE 100* 06/29/2012 0435   BUN 14 06/29/2012 0435   CREATININE 0.68 06/29/2012 0435   CALCIUM 9.9 06/29/2012 0435   PROT 6.9 06/28/2012 1906   ALBUMIN 4.0 06/28/2012 1906   AST 19 06/28/2012 1906   ALT 12 06/28/2012 1906   ALKPHOS 61 06/28/2012 1906   BILITOT 0.3 06/28/2012 1906   GFRNONAA 74* 06/29/2012 0435   GFRAA 85* 06/29/2012 0435   COAGS Lab Results  Component Value Date   INR 0.96 06/28/2012   Lipid Panel No results found for this basename: chol, trig, hdl, cholhdl, vldl, ldlcalc   HgbA1C  No results found for this basename: HGBA1C   Urine Drug Screen  No results found for this basename: labopia, cocainscrnur, labbenz, amphetmu, thcu, labbarb    Alcohol Level No results found for this basename: eth     Results for orders placed during the hospital encounter of  06/28/12 (from the past 24 hour(s))  PROTIME-INR     Status: Normal   Collection Time   06/28/12  7:06 PM      Component Value Range   Prothrombin Time 12.7  11.6 - 15.2 seconds   INR 0.96  0.00 - 1.49  APTT     Status: Normal   Collection Time   06/28/12  7:06 PM      Component Value Range   aPTT 31  24 - 37 seconds  CBC     Status: Normal   Collection Time   06/28/12  7:06 PM      Component Value Range   WBC 7.8  4.0 - 10.5 K/uL   RBC 4.44  3.87 - 5.11 MIL/uL   Hemoglobin 13.2  12.0 - 15.0 g/dL   HCT 16.1  09.6 - 04.5 %   MCV 86.5  78.0 - 100.0 fL   MCH 29.7  26.0 - 34.0 pg   MCHC 34.4  30.0 - 36.0 g/dL   RDW 40.9  81.1 - 91.4 %    Platelets 209  150 - 400 K/uL  DIFFERENTIAL     Status: Normal   Collection Time   06/28/12  7:06 PM      Component Value Range   Neutrophils Relative 58  43 - 77 %   Neutro Abs 4.5  1.7 - 7.7 K/uL   Lymphocytes Relative 31  12 - 46 %   Lymphs Abs 2.4  0.7 - 4.0 K/uL   Monocytes Relative 9  3 - 12 %   Monocytes Absolute 0.7  0.1 - 1.0 K/uL   Eosinophils Relative 2  0 - 5 %   Eosinophils Absolute 0.2  0.0 - 0.7 K/uL   Basophils Relative 1  0 - 1 %   Basophils Absolute 0.0  0.0 - 0.1 K/uL  COMPREHENSIVE METABOLIC PANEL     Status: Abnormal   Collection Time   06/28/12  7:06 PM      Component Value Range   Sodium 132 (*) 135 - 145 mEq/L   Potassium 4.1  3.5 - 5.1 mEq/L   Chloride 98  96 - 112 mEq/L   CO2 24  19 - 32 mEq/L   Glucose, Bld 99  70 - 99 mg/dL   BUN 18  6 - 23 mg/dL   Creatinine, Ser 7.82  0.50 - 1.10 mg/dL   Calcium 95.6  8.4 - 21.3 mg/dL   Total Protein 6.9  6.0 - 8.3 g/dL   Albumin 4.0  3.5 - 5.2 g/dL   AST 19  0 - 37 U/L   ALT 12  0 - 35 U/L   Alkaline Phosphatase 61  39 - 117 U/L   Total Bilirubin 0.3  0.3 - 1.2 mg/dL   GFR calc non Af Amer 62 (*) >90 mL/min   GFR calc Af Amer 72 (*) >90 mL/min  TROPONIN I     Status: Normal   Collection Time   06/28/12  7:06 PM      Component Value Range   Troponin I <0.30  <0.30 ng/mL  GLUCOSE, CAPILLARY     Status: Normal   Collection Time   06/28/12  7:13 PM      Component Value Range   Glucose-Capillary 99  70 - 99 mg/dL  POCT I-STAT TROPONIN I     Status: Normal   Collection Time   06/28/12  7:16 PM      Component Value Range  Troponin i, poc 0.00  0.00 - 0.08 ng/mL   Comment 3           POCT I-STAT, CHEM 8     Status: Normal   Collection Time   06/28/12  7:18 PM      Component Value Range   Sodium 136  135 - 145 mEq/L   Potassium 4.2  3.5 - 5.1 mEq/L   Chloride 100  96 - 112 mEq/L   BUN 21  6 - 23 mg/dL   Creatinine, Ser 9.62  0.50 - 1.10 mg/dL   Glucose, Bld 96  70 - 99 mg/dL   Calcium, Ion 9.52  8.41 -  1.30 mmol/L   TCO2 25  0 - 100 mmol/L   Hemoglobin 13.9  12.0 - 15.0 g/dL   HCT 32.4  40.1 - 02.7 %  URINALYSIS, MICROSCOPIC ONLY     Status: Abnormal   Collection Time   06/28/12  7:58 PM      Component Value Range   Color, Urine YELLOW  YELLOW   APPearance CLEAR  CLEAR   Specific Gravity, Urine 1.009  1.005 - 1.030   pH 7.0  5.0 - 8.0   Glucose, UA NEGATIVE  NEGATIVE mg/dL   Hgb urine dipstick TRACE (*) NEGATIVE   Bilirubin Urine NEGATIVE  NEGATIVE   Ketones, ur NEGATIVE  NEGATIVE mg/dL   Protein, ur NEGATIVE  NEGATIVE mg/dL   Urobilinogen, UA 0.2  0.0 - 1.0 mg/dL   Nitrite NEGATIVE  NEGATIVE   Leukocytes, UA NEGATIVE  NEGATIVE   RBC / HPF 0-2  <3 RBC/hpf  CBC     Status: Abnormal   Collection Time   06/29/12  4:35 AM      Component Value Range   WBC 11.0 (*) 4.0 - 10.5 K/uL   RBC 4.08  3.87 - 5.11 MIL/uL   Hemoglobin 12.0  12.0 - 15.0 g/dL   HCT 25.3 (*) 66.4 - 40.3 %   MCV 85.8  78.0 - 100.0 fL   MCH 29.4  26.0 - 34.0 pg   MCHC 34.3  30.0 - 36.0 g/dL   RDW 47.4  25.9 - 56.3 %   Platelets 190  150 - 400 K/uL  BASIC METABOLIC PANEL     Status: Abnormal   Collection Time   06/29/12  4:35 AM      Component Value Range   Sodium 134 (*) 135 - 145 mEq/L   Potassium 3.4 (*) 3.5 - 5.1 mEq/L   Chloride 97  96 - 112 mEq/L   CO2 27  19 - 32 mEq/L   Glucose, Bld 100 (*) 70 - 99 mg/dL   BUN 14  6 - 23 mg/dL   Creatinine, Ser 8.75  0.50 - 1.10 mg/dL   Calcium 9.9  8.4 - 64.3 mg/dL   GFR calc non Af Amer 74 (*) >90 mL/min   GFR calc Af Amer 85 (*) >90 mL/min  MAGNESIUM     Status: Normal   Collection Time   06/29/12  4:35 AM      Component Value Range   Magnesium 1.7  1.5 - 2.5 mg/dL  PHOSPHORUS     Status: Normal   Collection Time   06/29/12  4:35 AM      Component Value Range   Phosphorus 3.5  2.3 - 4.6 mg/dL    Ct Head Wo Contrast  06/28/2012  *RADIOLOGY REPORT*  Clinical Data: Code stroke.  Hypertension.  Combative.  CT HEAD WITHOUT  CONTRAST  Technique:  Contiguous  axial images were obtained from the base of the skull through the vertex without contrast.  Comparison: None.  Findings: There is moderate central cortical atrophy. Periventricular white matter changes are consistent with small vessel disease. There is no evidence for hemorrhage, mass lesion, or acute infarction.  Bone windows are unremarkable.  There is dense atherosclerotic calcification of the internal carotid arteries.  IMPRESSION:  1.  Atrophy and small vessel disease. 2. No evidence for acute intracranial abnormality.  Critical test results telephoned to Dr. Colbert Coyer at the time of interpretation on date 06/28/2012 at time 7:19 p.m.   Original Report Authenticated By: Patterson Hammersmith, M.D.    Dg Chest Port 1 View  06/29/2012  *RADIOLOGY REPORT*  Clinical Data: Stroke symptoms  PORTABLE CHEST - 1 VIEW  Comparison: None.  Findings: The cardiac silhouette is normal in size and configuration.  No mediastinal or hilar mass or adenopathy.  Mild scarring at the apices.  The lungs are otherwise clear.  Small surgical vascular clips project over the left anterior chest wall consistent with previous left breast surgery.  The bony thorax is demineralized but intact.  IMPRESSION: No acute cardiopulmonary disease.   Original Report Authenticated By: Domenic Moras, M.D.     CT of the brain   IMPRESSION:  1. Atrophy and small vessel disease.  2. No evidence for acute intracranial abnormality   CT angio  Not ordered  MRI of the brain  pending  MRA of the brain  pending  2D Echocardiogram  pending  Carotid Doppler  pending  CXR   IMPRESSION:  No acute cardiopulmonary disease.   EKG   ATRIAL-PACED COMPLEXES ~ other complexes also detected CONSIDER ANTERIOR INFARCT ~ diminished R <0.52mV V3 RECONSTRUCTED PACER SPIKES IN LD(S) aVR ~ bedside recording  Physical Exam   The patient is alert and cooperative.  Respiratory examination is clear bilaterally.  Cardiovascular examination reveals a  regular rate and rhythm, no obvious murmurs or rubs are noted.  Abdomen is soft, nontender, positive bowel sounds.  Extremities are without significant edema.  The patient is able to repeat well, and seemed to name fairly well, but with general speech, she makes paraphasic errors, and will have difficulty following verbal commands.   Neurologic exam reveals full extraocular movements, speech is normal. Visual fields are full.  Motor testing reveals good strength of all four extremities.  The patient has good finer-nose-finger and heel-to-shin bilaterally. Gait was not tested.  Deep tendon reflexes are symmetric and normal. Toes are down going bilaterally.    ASSESSMENT [name] is a 78 y.o. female with a left brain stroke.  Stroke risk factors:  hypertension  Hospital day # 1  TREATMENT/PLAN  [name] is a 78 year old patient with onset of an aphasia syndrome. The patient has received TPA, and she has done well overnight. The aphasia appears to be improved compared to what she was on initial presentation. The patient still had is making paraphasic errors, and is having trouble following pure verbal commands. The patient indicates that she was not on aspirin or Plavix prior to admission. Stroke workup is underway. The patient was quite hypertensive initially prior to admission. The patient is mildly hyponatremic.   -MRI the brain -MRA of the head -2-D echocardiogram -Carotid Doppler study -Cardiac monitoring while in hospital -Aspirin therapy when appropriate -Mobilize patient when appropriate -Speech therapy to see, the patient is n.p.o. -Occupational and physical therapy -Follow clinical examination -BMET,  CBC, TSH in a.m.    Lesly Dukes

## 2012-06-29 NOTE — H&P (Addendum)
Name: Alexa Rowe MRN: [id number] DOB: [birth year]    LOS: 1  PULMONARY / CRITICAL CARE MEDICINE  HPI:   78 years old female with PMH relevant for HTN, dyslipidemia and remote history of ovarian and breast cancer. Presents with AMS characterized by confusion and dysarthria. She was brought to the ED and stroke alert was called. She received TPA. Her systolic blood pressure had been  As high as 200 with good response to 10 mg of IV labetalol. Critical care consult was called to admit to the ICU for close monitoring and BP control. At the time of my exam the patient is awake, oriented only in person, confused, intermittently able to follow simple commands. Hemodynamically stable, saturating 98% on RA, able to protect her airway. At baseline the patient is independent and completely functional.   Past Medical History  Diagnosis Date  . Hypertension   . Arthritis    No past surgical history on file. Prior to Admission medications   Medication Sig Start Date End Date Taking? Authorizing Provider  atorvastatin (LIPITOR) 10 MG tablet Take 10 mg by mouth at bedtime.   Yes Historical Provider, MD  celecoxib (CELEBREX) 200 MG capsule Take 200 mg by mouth daily.   Yes Historical Provider, MD  HYDROcodone-acetaminophen (NORCO/VICODIN) 5-325 MG per tablet Take 1 tablet by mouth every 4 (four) hours as needed. For pain   Yes Historical Provider, MD  metoprolol (LOPRESSOR) 50 MG tablet Take 50 mg by mouth 2 (two) times daily.   Yes Historical Provider, MD  ranitidine (ZANTAC) 150 MG tablet Take 150 mg by mouth daily as needed. For upset stomach   Yes Historical Provider, MD  senna-docusate (SENOKOT-S) 8.6-50 MG per tablet Take 1 tablet by mouth at bedtime.   Yes Historical Provider, MD  verapamil (VERELAN PM) 180 MG 24 hr capsule Take 180 mg by mouth daily.   Yes Historical Provider, MD   Allergies No Known Allergies  Family History No family history on file. Social History  reports that she has  never smoked. She does not have any smokeless tobacco history on file. She reports that she does not drink alcohol. Her drug history not on file.  Review Of Systems:  Unable to provide.   Current Status:  Vital Signs: Temp:  [97.4 F (36.3 C)-99.9 F (37.7 C)] 99.9 F (37.7 C) (10/04 2330) Pulse Rate:  [52-88] 52  (10/04 2330) Resp:  [18-29] 18  (10/04 2231) BP: (151-216)/(81-164) 161/84 mmHg (10/04 2330) SpO2:  [95 %-100 %] 95 % (10/04 2330)  Physical Examination: General:  No acute distress Neuro:  Awake, confused, oriented only in person, dysarthric, moves all 4 extremities, no facial droop. HEENT:  PERRL, pink conjunctivae, moist membranes Neck:  Supple, no JVD   Cardiovascular:  RRR, no M/R/G Lungs:  Adequate air entry bilaterally, no W/R/R Abdomen:  Soft, nontender, nondistended, bowel sounds present Musculoskeletal:  Moves all extremities, no pedal edema, 2nd toe amputation bilaterally. Skin:  No rash  Active Problems:  * No active hospital problems. *    ASSESSMENT AND PLAN  PULMONARY No results found for this basename: PHART:5,PCO2:5,PCO2ART:5,PO2ART:5,HCO3:5,O2SAT:5 in the last 168 hours Ventilator Settings:   CXR:  Pending  A:   1) No issues. Able to protect her airway P:   - Supplemental O2 via [place] as needed.  CARDIOVASCULAR  Lab 06/28/12 1906  TROPONINI <0.30  LATICACIDVEN --  PROBNP --   ECG:  Atrial pacing Lines: Peripheral lines  A:  1) Hypertensive with adequate  response to labetalol 10 mg IVP P:  - Will continue labetalol 10 mg IVP q2hrs PRN with goal of SBP 160 to 180 and diastolic < 105  RENAL  Lab 06/28/12 1918 06/28/12 1906  NA 136 132*  K 4.2 4.1  CL 100 98  CO2 -- 24  BUN 21 18  CREATININE 0.90 0.80  CALCIUM -- 10.0  MG -- --  PHOS -- --   Intake/Output    None    Foley:  06/28/12  A:   No issues P:   - Monitor BMP  GASTROINTESTINAL  Lab 06/28/12 1906  AST 19  ALT 12  ALKPHOS 61  BILITOT 0.3  PROT 6.9    ALBUMIN 4.0    A:   1) No issues P:   GI prophylaxis with Protonix  HEMATOLOGIC  Lab 06/28/12 1918 06/28/12 1906  HGB 13.9 13.2  HCT 41.0 38.4  PLT -- 209  INR -- 0.96  APTT -- 31   A:   1) No issues P:  - Follow up CBC  INFECTIOUS  Lab 06/28/12 1906  WBC 7.8  PROCALCITON --    A:   1) No evidence of acute infectious process P:   - No antibiotics indicated.  ENDOCRINE  Lab 06/28/12 1913  GLUCAP 99   A:   1) No history of diabetes  NEUROLOGIC  A:   1) Acute ischemic stroke P:   - Post TPA - Echocardiogram ordered - Carotid doppler ordered. - Neuro checks q2hrs - Speech / swallow evaluation in am - PT/OT consult  BEST PRACTICE / DISPOSITION - Level of Care:  ICU - Primary Service:  PCCM - Consultants:  Neurology - Code Status:  Full code - Diet:  NPO - DVT Px: SCD's - GI Px:  IV protonix - Skin Integrity:  Intact - Social / Family:  Family updated at baseline.  The patient is critically ill with multiple organ systems failure and requires high complexity decision making for assessment and support, frequent evaluation and titration of therapies, application of advanced monitoring technologies and extensive interpretation of multiple databases.   Critical Care Time devoted to patient care services described in this note is: 1 Hour  Overton Mam, M.D. Pulmonary and Critical Care Medicine [facility] Pager: [phone]  06/29/2012, 1:04 AM

## 2012-06-29 NOTE — Progress Notes (Signed)
PT Note:  Pt currently on bedrest after receiving tPA last evening with protocol to hold mobility x 24 hours. Will follow-up for PT evaluation 06/30/12. Thanks.  06/29/2012 Cephus Shelling, PT, DPT [phone]

## 2012-06-29 NOTE — Progress Notes (Addendum)
Agitation and anxiety  Responded very well earlier today to Ativan one dose. Ativan 1 mg ordered.   Soft restrains ordered for patient safety and continuous medical care.

## 2012-06-29 NOTE — ED Provider Notes (Signed)
History     CSN: [id number]  Arrival date & time 06/28/12  4098   First MD Initiated Contact with Patient 06/28/12 1911      Chief Complaint  Patient presents with  . Code Stroke    (Consider location/radiation/quality/duration/timing/severity/associated sxs/prior treatment) The history is provided by the EMS personnel and a relative.   78 year old female is brought in as a code stroke. Her son is here and states that she was speaking normally and 5:30 PM, but at 5:45, and she was unable to speak. She is not able to answer questions, so history is not able to be obtained from her.  Past Medical History  Diagnosis Date  . Hypertension   . Arthritis     No past surgical history on file.  No family history on file.  History  Substance Use Topics  . Smoking status: Never Smoker   . Smokeless tobacco: Not on file  . Alcohol Use: No    OB History    Grav Para Term Preterm Abortions TAB SAB Ect Mult Living                  Review of Systems  Unable to perform ROS: Dementia    Allergies  Review of patient's allergies indicates no known allergies.  Home Medications  No current outpatient prescriptions on file.  BP 150/124  Pulse 100  Temp 97.7 F (36.5 C) (Oral)  Resp 21  SpO2 99%  Physical Exam  Nursing note and vitals reviewed. 78 year old female, resting comfortably and in no acute distress. Vital signs are significant for mild hypertension with blood pressure 151/91. Oxygen saturation is 100%, which is normal. Head is normocephalic and atraumatic. PERRLA, EOMI. Oropharynx is clear. Neck is nontender and supple without adenopathy or JVD. There are no carotid bruits. Back is nontender and there is no CVA tenderness. Lungs are clear without rales, wheezes, or rhonchi. Chest is nontender. Heart has regular rate and rhythm without murmur. Abdomen is soft, flat, nontender without masses or hepatosplenomegaly and peristalsis is normoactive. Extremities have no  cyanosis or edema, full range of motion is present. Skin is warm and dry without rash. Neurologic: She is awake and alert. Speech areas from rambling words to gibberish. She does not follow commands which makes some of the neurologic evaluation very difficult. Cranial nerves are intact. There are no gross motor or sensory deficits.   ED Course  Procedures (including critical care time)  Results for orders placed during the hospital encounter of 06/28/12  PROTIME-INR      Component Value Range   Prothrombin Time 12.7  11.6 - 15.2 seconds   INR 0.96  0.00 - 1.49  APTT      Component Value Range   aPTT 31  24 - 37 seconds  CBC      Component Value Range   WBC 7.8  4.0 - 10.5 K/uL   RBC 4.44  3.87 - 5.11 MIL/uL   Hemoglobin 13.2  12.0 - 15.0 g/dL   HCT 11.9  14.7 - 82.9 %   MCV 86.5  78.0 - 100.0 fL   MCH 29.7  26.0 - 34.0 pg   MCHC 34.4  30.0 - 36.0 g/dL   RDW 56.2  13.0 - 86.5 %   Platelets 209  150 - 400 K/uL  DIFFERENTIAL      Component Value Range   Neutrophils Relative 58  43 - 77 %   Neutro Abs 4.5  1.7 - 7.7  K/uL   Lymphocytes Relative 31  12 - 46 %   Lymphs Abs 2.4  0.7 - 4.0 K/uL   Monocytes Relative 9  3 - 12 %   Monocytes Absolute 0.7  0.1 - 1.0 K/uL   Eosinophils Relative 2  0 - 5 %   Eosinophils Absolute 0.2  0.0 - 0.7 K/uL   Basophils Relative 1  0 - 1 %   Basophils Absolute 0.0  0.0 - 0.1 K/uL  COMPREHENSIVE METABOLIC PANEL      Component Value Range   Sodium 132 (*) 135 - 145 mEq/L   Potassium 4.1  3.5 - 5.1 mEq/L   Chloride 98  96 - 112 mEq/L   CO2 24  19 - 32 mEq/L   Glucose, Bld 99  70 - 99 mg/dL   BUN 18  6 - 23 mg/dL   Creatinine, Ser 9.14  0.50 - 1.10 mg/dL   Calcium 78.2  8.4 - 95.6 mg/dL   Total Protein 6.9  6.0 - 8.3 g/dL   Albumin 4.0  3.5 - 5.2 g/dL   AST 19  0 - 37 U/L   ALT 12  0 - 35 U/L   Alkaline Phosphatase 61  39 - 117 U/L   Total Bilirubin 0.3  0.3 - 1.2 mg/dL   GFR calc non Af Amer 62 (*) >90 mL/min   GFR calc Af Amer 72 (*) >90  mL/min  TROPONIN I      Component Value Range   Troponin I <0.30  <0.30 ng/mL  GLUCOSE, CAPILLARY      Component Value Range   Glucose-Capillary 99  70 - 99 mg/dL  POCT I-STAT, CHEM 8      Component Value Range   Sodium 136  135 - 145 mEq/L   Potassium 4.2  3.5 - 5.1 mEq/L   Chloride 100  96 - 112 mEq/L   BUN 21  6 - 23 mg/dL   Creatinine, Ser 2.13  0.50 - 1.10 mg/dL   Glucose, Bld 96  70 - 99 mg/dL   Calcium, Ion 0.86  5.78 - 1.30 mmol/L   TCO2 25  0 - 100 mmol/L   Hemoglobin 13.9  12.0 - 15.0 g/dL   HCT 46.9  62.9 - 52.8 %  POCT I-STAT TROPONIN I      Component Value Range   Troponin i, poc 0.00  0.00 - 0.08 ng/mL   Comment 3           URINALYSIS, MICROSCOPIC ONLY      Component Value Range   Color, Urine YELLOW  YELLOW   APPearance CLEAR  CLEAR   Specific Gravity, Urine 1.009  1.005 - 1.030   pH 7.0  5.0 - 8.0   Glucose, UA NEGATIVE  NEGATIVE mg/dL   Hgb urine dipstick TRACE (*) NEGATIVE   Bilirubin Urine NEGATIVE  NEGATIVE   Ketones, ur NEGATIVE  NEGATIVE mg/dL   Protein, ur NEGATIVE  NEGATIVE mg/dL   Urobilinogen, UA 0.2  0.0 - 1.0 mg/dL   Nitrite NEGATIVE  NEGATIVE   Leukocytes, UA NEGATIVE  NEGATIVE   RBC / HPF 0-2  <3 RBC/hpf  CBC      Component Value Range   WBC 11.0 (*) 4.0 - 10.5 K/uL   RBC 4.08  3.87 - 5.11 MIL/uL   Hemoglobin 12.0  12.0 - 15.0 g/dL   HCT 41.3 (*) 24.4 - 01.0 %   MCV 85.8  78.0 - 100.0 fL   MCH  29.4  26.0 - 34.0 pg   MCHC 34.3  30.0 - 36.0 g/dL   RDW 47.8  29.5 - 62.1 %   Platelets 190  150 - 400 K/uL  BASIC METABOLIC PANEL      Component Value Range   Sodium 134 (*) 135 - 145 mEq/L   Potassium 3.4 (*) 3.5 - 5.1 mEq/L   Chloride 97  96 - 112 mEq/L   CO2 27  19 - 32 mEq/L   Glucose, Bld 100 (*) 70 - 99 mg/dL   BUN 14  6 - 23 mg/dL   Creatinine, Ser 3.08  0.50 - 1.10 mg/dL   Calcium 9.9  8.4 - 65.7 mg/dL   GFR calc non Af Amer 74 (*) >90 mL/min   GFR calc Af Amer 85 (*) >90 mL/min  MAGNESIUM      Component Value Range    Magnesium 1.7  1.5 - 2.5 mg/dL  PHOSPHORUS      Component Value Range   Phosphorus 3.5  2.3 - 4.6 mg/dL  MRSA PCR SCREENING      Component Value Range   MRSA by PCR NEGATIVE  NEGATIVE   Ct Head Wo Contrast  06/29/2012  *RADIOLOGY REPORT*  Clinical Data: Follow-up code stroke.  CT HEAD WITHOUT CONTRAST  Technique:  Contiguous axial images were obtained from the base of the skull through the vertex without contrast.  Comparison: 06/28/2012  Findings: The brain shows generalized atrophy.  There are chronic small vessel changes within the deep white matter.  No sign of acute infarction, mass lesion, hemorrhage, hydrocephalus or extra- axial collection.  There is atherosclerotic calcification of the major vessels at the base of the brain.  Sinuses are clear.  No calvarial abnormality.  IMPRESSION: Atrophy and chronic small vessel change.  No acute finding.   Original Report Authenticated By: Thomasenia Sales, M.D.    Ct Head Wo Contrast  06/28/2012  *RADIOLOGY REPORT*  Clinical Data: Code stroke.  Hypertension.  Combative.  CT HEAD WITHOUT CONTRAST  Technique:  Contiguous axial images were obtained from the base of the skull through the vertex without contrast.  Comparison: None.  Findings: There is moderate central cortical atrophy. Periventricular white matter changes are consistent with small vessel disease. There is no evidence for hemorrhage, mass lesion, or acute infarction.  Bone windows are unremarkable.  There is dense atherosclerotic calcification of the internal carotid arteries.  IMPRESSION:  1.  Atrophy and small vessel disease. 2. No evidence for acute intracranial abnormality.  Critical test results telephoned to Dr. Colbert Coyer at the time of interpretation on date 06/28/2012 at time 7:19 p.m.   Original Report Authenticated By: Patterson Hammersmith, M.D.    Dg Chest Port 1 View  06/29/2012  *RADIOLOGY REPORT*  Clinical Data: Stroke symptoms  PORTABLE CHEST - 1 VIEW  Comparison: None.  Findings:  The cardiac silhouette is normal in size and configuration.  No mediastinal or hilar mass or adenopathy.  Mild scarring at the apices.  The lungs are otherwise clear.  Small surgical vascular clips project over the left anterior chest wall consistent with previous left breast surgery.  The bony thorax is demineralized but intact.  IMPRESSION: No acute cardiopulmonary disease.   Original Report Authenticated By: Domenic Moras, M.D.       1. CVA (cerebral infarction)    CRITICAL CARE Performed by: Dione Booze   Total critical care time: 35 minutes  Critical care time was exclusive of separately billable procedures and treating  other patients.  Critical care was necessary to treat or prevent imminent or life-threatening deterioration.  Critical care was time spent personally by me on the following activities: development of treatment plan with patient and/or surrogate as well as nursing, discussions with consultants, evaluation of patient's response to treatment, examination of patient, obtaining history from patient or surrogate, ordering and performing treatments and interventions, ordering and review of laboratory studies, ordering and review of radiographic studies, pulse oximetry and re-evaluation of patient's condition.    MDM  Stroke which is manifested by expressive and receptive aphasia. There no obvious motor or sensory involvement. She is seen in conjunction with Dr. Otelia Limes of [facility]. He is going to administer TPA.        Dione Booze, MD 06/30/12 [phone]

## 2012-06-29 NOTE — Progress Notes (Signed)
UR completed 

## 2012-06-29 NOTE — Evaluation (Signed)
Speech Language Pathology Evaluation Patient Details Name: Alexa Rowe MRN: [id number] DOB: 09-23-1918 Today's Date: 06/29/2012 Time: 1000-1015 SLP Time Calculation (min): 15 min  Problem List:  Patient Active Problem List  Diagnosis  . Pyelonephritis, acute  . Unspecified essential hypertension  . Other and unspecified hyperlipidemia  . Dehydration  . Weakness generalized  . Malignant neoplasm of ovary  . Malignant neoplasm of breast (female), unspecified site  . Aortic valve disorders  . Mitral valve insufficiency and aortic valve insufficiency  . Osteoarthrosis, unspecified whether generalized or localized, unspecified site  . Disorder of bone and cartilage, unspecified  . Unspecified transient cerebral ischemia  . Rheumatoid arthritis  . Esophageal reflux  . Abnormality of gait  . Hypokalemia  . Hyponatremia  . CVA (cerebral infarction)   Past Medical History:  Past Medical History  Diagnosis Date  . Hypertension   . Arthritis    Past Surgical History: No past surgical history on file. HPI:  78 y/o female admitted to ED with AMS. CT indicates atrophy and small vessel disease.  MRI pending.  Patient referred for Cognitive Linguistic Evaluaton per stroke protocol.     Assessment / Plan / Recommendation Clinical Impression  Moderate cognitive impairment in areas of environmental orientation, safety awareness, sustained attention, sequencing, simple problem solving , short term and long term memory. Secondary behavior of   perseveration noted.  Recommend ST treatment in acute care setting to improve overall cognitive skills to improve safety and participation with  ADL's.     SLP Assessment  Patient needs continued Speech Lanaguage Pathology Services    Follow Up Recommendations  Skilled Nursing facility;24 hour supervision/assistance    Frequency and Duration min 2x/week  2 weeks      SLP Goals  SLP Goals Potential to Achieve Goals: Good Potential  Considerations: Cooperation/participation level;Previous level of function Progress/Goals/Alternative treatment plan discussed with pt/caregiver and they: No caregivers available;Patient unable to parrticipate in goal setting SLP Goal #1: Increase orientation to place, time, situation with minimal verbal and visual cues  SLP Goal #2: Basic problem solving tasks with moderate verbal cues  ( use of call light)  SLP Goal #3: State 5/5 am events with moderate verbal and visual cues  SLP Evaluation Prior Functioning  Cognitive/Linguistic Baseline: Information not available Type of Home: Independent living facility Lives With: Spouse Available Help at Discharge: Other (Comment) (unknown) 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  Overall Cognitive Status: Impaired Arousal/Alertness: Awake/alert Orientation Level: Oriented to person Attention: Focused Focused Attention: Impaired Focused Attention Impairment: Verbal basic;Functional basic Memory: Impaired Memory Impairment: Storage deficit;Retrieval deficit;Decreased recall of new information;Decreased long term memory;Decreased short term memory Decreased Long Term Memory: Verbal basic;Functional basic Decreased Short Term Memory: Verbal basic;Functional basic Awareness: Impaired Awareness Impairment: Intellectual impairment Problem Solving: Impaired Problem Solving Impairment: Verbal basic;Functional basic Executive Function: Reasoning Reasoning: Impaired Reasoning Impairment: Verbal basic;Functional basic Behaviors: Restless;Perseveration Safety/Judgment: Impaired    Comprehension  Auditory Comprehension Overall Auditory Comprehension: Impaired Yes/No Questions: Impaired Basic Immediate Environment Questions: 25-49% accurate Commands: Impaired Two Step Basic Commands: 25-49% accurate Conversation: Simple Interfering Components: Attention;Working Radio broadcast assistant: Sports administrator: Not tested Reading Comprehension Reading Status: Not tested    Expression Expression Primary Mode of Expression: Verbal Verbal Expression Overall Verbal Expression: Impaired Level of Generative/Spontaneous Verbalization: Conversation Interfering Components: Attention Non-Verbal Means of Communication: Not applicable Written Expression Dominant Hand: Right Written Expression: Not tested   Oral / Motor Oral Motor/Sensory Function Overall Oral Motor/Sensory  Function: Appears within functional limits for tasks assessed Motor Speech Overall Motor Speech: Appears within functional limits for tasks assessed   GO    Moreen Fowler MS, CCC-SLP [phone] [facility] 06/29/2012, 10:57 AM

## 2012-06-29 NOTE — Evaluation (Signed)
Clinical/Bedside Swallow Evaluation Patient Details  Name: Alexa Rowe MRN: [id number] Date of Birth: September 11, 1918  Today's Date: 06/29/2012 Time: 0930-1000 SLP Time Calculation (min): 30 min  Past Medical History:  Past Medical History  Diagnosis Date  . Hypertension   . Arthritis    Past Surgical History: No past surgical history on file. HPI:  78 y/o female admitted to ED with AMS.  Code stroke called patient received tPA in ED.  CT indicates atrophy and smalle vessel disease .  Completion of MRI pending. Patient referred for BSE per stroke protocol.    Assessment / Plan / Recommendation Clinical Impression  Oropharyngeal swallow WFL for regular consistency and thin liquids.  No outward s/s of aspiration noted throughout evaluation.  Recommend to proceed with regular consistency and thin liquids with full supervision with all meals secondary to noted confusion.  ST to follow in acute care setting for diet tolerance.      Aspiration Risk  Mild    Diet Recommendation Regular;Thin liquid   Liquid Administration via: Cup;No straw Medication Administration: Whole meds with liquid Supervision: Full supervision/cueing for compensatory strategies Compensations: Slow rate;Small sips/bites Postural Changes and/or Swallow Maneuvers: Seated upright 90 degrees;Upright 30-60 min after meal    Other  Recommendations Oral Care Recommendations: Oral care BID Other Recommendations: Clarify dietary restrictions   Follow Up Recommendations  24 hour supervision/assistance;Skilled Nursing facility    Frequency and Duration min 2x/week  2 weeks       SLP Swallow Goals Patient will consume recommended diet without observed clinical signs of aspiration with: Modified independent assistance Patient will utilize recommended strategies during swallow to increase swallowing safety with: Modified independent assistance   Swallow Study Prior Functional Status   Lives in ILF no prior reports  of dysphagia     General Date of Onset: 06/28/12 HPI: 78 y/o female admitted to ED with AMS.  Code stroke called patient received tPA in ED.  CT indicates atrophy and smalle vessel disease .  Completion of MRI pending. Patient referred for BSE per stroke protocol.  Type of Study: Bedside swallow evaluation Previous Swallow Assessment: No reports in Epic Diet Prior to this Study: NPO Temperature Spikes Noted: No Respiratory Status: Room air History of Recent Intubation: No Behavior/Cognition: Alert;Cooperative;Pleasant mood;Confused;Decreased sustained attention Oral Cavity - Dentition: Adequate natural dentition Self-Feeding Abilities: Needs assist Patient Positioning: Upright in bed Baseline Vocal Quality: Clear Volitional Cough: Strong Volitional Swallow: Able to elicit    Oral/Motor/Sensory Function Overall Oral Motor/Sensory Function: Appears within functional limits for tasks assessed   Ice Chips Ice chips: Within functional limits   Thin Liquid Thin Liquid: Within functional limits Presentation: Cup;Spoon    Nectar Thick Nectar Thick Liquid: Not tested   Honey Thick Honey Thick Liquid: Not tested   Puree Puree: Within functional limits Presentation: Spoon   Solid   GO    Solid: Within functional limits       Alexa Rowe 06/29/2012,10:28 AM

## 2012-06-30 ENCOUNTER — Inpatient Hospital Stay (HOSPITAL_COMMUNITY): Payer: Medicare Other

## 2012-06-30 ENCOUNTER — Encounter (HOSPITAL_COMMUNITY): Payer: Self-pay | Admitting: *Deleted

## 2012-06-30 DIAGNOSIS — I359 Nonrheumatic aortic valve disorder, unspecified: Secondary | ICD-10-CM

## 2012-06-30 LAB — BASIC METABOLIC PANEL
Calcium: 9.6 mg/dL (ref 8.4–10.5)
Creatinine, Ser: 0.72 mg/dL (ref 0.50–1.10)
GFR calc Af Amer: 84 mL/min — ABNORMAL LOW (ref >90)
GFR calc non Af Amer: 72 mL/min — ABNORMAL LOW (ref >90)
Sodium: 136 mEq/L (ref 135–145)

## 2012-06-30 LAB — CBC
Platelets: 184 10*3/uL (ref 150–400)
RBC: 4.13 MIL/uL (ref 3.87–5.11)
RDW: 13.8 % (ref 11.5–15.5)
WBC: 6 10*3/uL (ref 4.0–10.5)

## 2012-06-30 LAB — TSH: TSH: 3.457 u[IU]/mL (ref 0.350–4.500)

## 2012-06-30 MED ORDER — SODIUM CHLORIDE 0.9 % IV BOLUS (SEPSIS)
250.0000 mL | Freq: Once | INTRAVENOUS | Status: AC
  Administered 2012-06-30: 250 mL via INTRAVENOUS

## 2012-06-30 MED ORDER — PANTOPRAZOLE SODIUM 40 MG PO TBEC
40.0000 mg | DELAYED_RELEASE_TABLET | Freq: Every day | ORAL | Status: DC
  Administered 2012-07-01 – 2012-07-02 (×2): 40 mg via ORAL
  Filled 2012-06-30 (×3): qty 1

## 2012-06-30 MED ORDER — SODIUM CHLORIDE 0.9 % IV SOLN: 250.0000 mL | INTRAVENOUS | Status: DC | PRN

## 2012-06-30 MED ORDER — PANTOPRAZOLE SODIUM 40 MG PO PACK
40.0000 mg | PACK | Freq: Every day | ORAL | Status: DC
  Administered 2012-06-30: 40 mg via ORAL
  Filled 2012-06-30 (×3): qty 20

## 2012-06-30 NOTE — Progress Notes (Signed)
Stroke Team Progress Note  HISTORY Alexa Rowe is an 78 y.o. female who was in her USOH today until 5:45 PM, when she developed sudden onset of what the family describes as confusion. At her baseline, she is independent with normal speech. 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 Specific features of the apparent confusion included inability to follow verbal commands, paraphasias, and incomprehensible speech. The patient could at times verbalize short, understandable statements fluently, but not in relation to direct questions and apparently only as a response to internal stimuli, with no indication of a clear thought process. She was transported to [facility] and code stroke initiated. She is not on anticoagulants at home, per family.  Her PMHx includes HTN, dyslipidemia and remote history of ovarian and breast cancer. Her systolic blood pressure in the ED was as high as the low 200's and was brought below 185 with IV labetalol.    SUBJECTIVE No family is at bedside. Overall she feels her condition is improved somewhat, aphasia is better, not resolved. No complaint of a headache.  OBJECTIVE Most recent Vital Signs: Temp: 97.5 F (36.4 C) (10/06 0400) Temp src: Axillary (10/06 0400) BP: 167/80 mmHg (10/06 0900) Pulse Rate: 74  (10/06 0900) Respiratory Rate: 18 O2 Saturation: 99%  CBG (last 3)   Basename 06/28/12 1913  GLUCAP 99   Intake/Output from previous day: 10/05 0701 - 10/06 0700 In: 1000 [I.V.:750; IV Piggyback:250] Out: 970 [Urine:970]  IV Fluid Intake:    Medications     . aspirin  325 mg Oral Once  . LORazepam  1 mg Intravenous Once  . LORazepam  1 mg Intravenous Q8H  . pantoprazole (PROTONIX) IV  40 mg Intravenous QHS  . potassium chloride  40 mEq Oral Once  . sodium chloride  250 mL Intravenous Once  PRN sodium chloride, labetalol, DISCONTD: sodium  chloride  Diet:  General  Activity:  Bedrest  DVT Prophylaxis: SCD  Significant Diagnostic Studies: CBC    Component Value Date/Time   WBC 6.0 06/30/2012 0445   RBC 4.13 06/30/2012 0445   HGB 12.1 06/30/2012 0445   HCT 35.6* 06/30/2012 0445   PLT 184 06/30/2012 0445   MCV 86.2 06/30/2012 0445   MCH 29.3 06/30/2012 0445   MCHC 34.0 06/30/2012 0445   RDW 13.8 06/30/2012 0445   LYMPHSABS 2.4 06/28/2012 1906   MONOABS 0.7 06/28/2012 1906   EOSABS 0.2 06/28/2012 1906   BASOSABS 0.0 06/28/2012 1906   CMP    Component Value Date/Time   NA 136 06/30/2012 0445   K 3.4* 06/30/2012 0445   CL 100 06/30/2012 0445   CO2 25 06/30/2012 0445   GLUCOSE 89 06/30/2012 0445   BUN 14 06/30/2012 0445   CREATININE 0.72 06/30/2012 0445   CALCIUM 9.6 06/30/2012 0445   PROT 6.9 06/28/2012 1906   ALBUMIN 4.0 06/28/2012 1906   AST 19 06/28/2012 1906   ALT 12 06/28/2012 1906   ALKPHOS 61 06/28/2012 1906   BILITOT 0.3 06/28/2012 1906   GFRNONAA 72* 06/30/2012 0445   GFRAA 84* 06/30/2012 0445   COAGS Lab Results  Component Value Date   INR 0.96 06/28/2012   Lipid Panel No results found for this basename: chol,  trig,  hdl,  cholhdl,  vldl,  ldlcalc   HgbA1C  No results found for this basename: HGBA1C   Urine Drug Screen  No results found for this basename:  labopia,  cocainscrnur,  labbenz,  amphetmu,  thcu,  labbarb    Alcohol Level No results found for this basename: eth     Results for orders placed during the hospital encounter of 06/28/12 (from the past 24 hour(s))  BASIC METABOLIC PANEL     Status: Abnormal   Collection Time   06/30/12  4:45 AM      Component Value Range   Sodium 136  135 - 145 mEq/L   Potassium 3.4 (*) 3.5 - 5.1 mEq/L   Chloride 100  96 - 112 mEq/L   CO2 25  19 - 32 mEq/L   Glucose, Bld 89  70 - 99 mg/dL   BUN 14  6 - 23 mg/dL   Creatinine, Ser 1.61  0.50 - 1.10 mg/dL   Calcium 9.6  8.4 - 09.6 mg/dL   GFR calc non Af Amer 72 (*) >90 mL/min   GFR calc Af Amer 84 (*) >90 mL/min   CBC     Status: Abnormal   Collection Time   06/30/12  4:45 AM      Component Value Range   WBC 6.0  4.0 - 10.5 K/uL   RBC 4.13  3.87 - 5.11 MIL/uL   Hemoglobin 12.1  12.0 - 15.0 g/dL   HCT 04.5 (*) 40.9 - 81.1 %   MCV 86.2  78.0 - 100.0 fL   MCH 29.3  26.0 - 34.0 pg   MCHC 34.0  30.0 - 36.0 g/dL   RDW 91.4  78.2 - 95.6 %   Platelets 184  150 - 400 K/uL    Ct Head Wo Contrast  06/29/2012  *RADIOLOGY REPORT*  Clinical Data: Follow-up code stroke.  CT HEAD WITHOUT CONTRAST  Technique:  Contiguous axial images were obtained from the base of the skull through the vertex without contrast.  Comparison: 06/28/2012  Findings: The brain shows generalized atrophy.  There are chronic small vessel changes within the deep white matter.  No sign of acute infarction, mass lesion, hemorrhage, hydrocephalus or extra- axial collection.  There is atherosclerotic calcification of the major vessels at the base of the brain.  Sinuses are clear.  No calvarial abnormality.  IMPRESSION: Atrophy and chronic small vessel change.  No acute finding.   Original Report Authenticated By: Thomasenia Sales, M.D.    Ct Head Wo Contrast  06/28/2012  *RADIOLOGY REPORT*  Clinical Data: Code stroke.  Hypertension.  Combative.  CT HEAD WITHOUT CONTRAST  Technique:  Contiguous axial images were obtained from the base of the skull through the vertex without contrast.  Comparison: None.  Findings: There is moderate central cortical atrophy. Periventricular white matter changes are consistent with small vessel disease. There is no evidence for hemorrhage, mass lesion, or acute infarction.  Bone windows are unremarkable.  There is dense atherosclerotic calcification of the internal carotid arteries.  IMPRESSION:  1.  Atrophy and small vessel disease. 2. No evidence for acute intracranial abnormality.  Critical test results telephoned to Dr. Colbert Coyer at the time of interpretation on date 06/28/2012 at time 7:19 p.m.   Original Report  Authenticated By: Patterson Hammersmith, M.D.    Dg Chest Port 1 View  06/29/2012  *RADIOLOGY REPORT*  Clinical Data: Stroke symptoms  PORTABLE CHEST - 1 VIEW  Comparison: None.  Findings: The cardiac silhouette is normal in size and configuration.  No mediastinal or hilar mass or adenopathy.  Mild scarring at the apices.  The lungs are otherwise clear.  Small surgical vascular clips  project over the left anterior chest wall consistent with previous left breast surgery.  The bony thorax is demineralized but intact.  IMPRESSION: No acute cardiopulmonary disease.   Original Report Authenticated By: Domenic Moras, M.D.     CT of the brain   IMPRESSION:  1. Atrophy and small vessel disease.  2. No evidence for acute intracranial abnormality   CT angio  Not ordered  MRI of the brain  pending  MRA of the brain  pending  2D Echocardiogram  pending  Carotid Doppler  pending  CXR   IMPRESSION:  No acute cardiopulmonary disease.   EKG   ATRIAL-PACED COMPLEXES ~ other complexes also detected CONSIDER ANTERIOR INFARCT ~ diminished R <0.52mV V3 RECONSTRUCTED PACER SPIKES IN LD(S) aVR ~ bedside recording  Physical Exam   The patient is sleepy, but she will cooperate.  Respiratory examination is clear bilaterally.  Cardiovascular examination reveals a regular rate and rhythm, no obvious murmurs or rubs are noted.  Abdomen is soft, nontender, positive bowel sounds.  Extremities are without significant edema.  The patient is able to repeat well, and seemed to name fairly well, but with general speech, she makes paraphasic errors, and will have difficulty following verbal commands.   Neurologic exam reveals full extraocular movements, speech is normal. Visual fields are full.  Motor testing reveals good strength of all four extremities.  The patient has good finger-nose-finger and heel-to-shin bilaterally. Gait was not tested.  Deep tendon reflexes are symmetric and normal. Toes are  down going bilaterally.    ASSESSMENT [name] is a 78 y.o. female with a left brain stroke.  Stroke risk factors:  hypertension  Hospital day # 2  TREATMENT/PLAN  [name] is a 77 year old patient with onset of an aphasia syndrome. The patient has received TPA, and she has done well overnight. The aphasia appears to be improved compared to what she was on initial presentation. The patient still had is making paraphasic errors, and is having trouble following pure verbal commands. The patient indicates that she was not on aspirin or Plavix prior to admission. Stroke workup is underway. The patient was quite hypertensive initially prior to admission. The patient is mildly hyponatremic, hypokalemic.   -MRI the brain -MRA of the head -2-D echocardiogram -Carotid Doppler study -Cardiac monitoring while in hospital -Aspirin therapy has been initiated -Mobilize patient when appropriate -Speech therapy has seen, and she is on a diet -Occupational and physical therapy -Follow clinical examination     Lesly Dukes

## 2012-06-30 NOTE — Evaluation (Signed)
Physical Therapy Evaluation Patient Details Name: Alexa Rowe MRN: [id number] DOB: 26-Jan-1918 Today's Date: 06/30/2012 Time: [id number] PT Time Calculation (min): 31 min  PT Assessment / Plan / Recommendation Clinical Impression  Pt admitted with increased confusion and difficulty speaking, given tpa. Pt admitted from [facility] rehab in which she was recovering from a small toe amputation and carpal tunnel surgery. Pt still unable to bear weight through RUE requiring increased assistance with all transfers and mobility. Pt has been ambulating with platform RW with PT prior to admission. Pt will benefit from skilled PT in the acute care setting in order to maximize functional mobility and safety to return to SNF    PT Assessment  Patient needs continued PT services    Follow Up Recommendations  Post acute inpatient    Does the patient have the potential to tolerate intense rehabilitation   No, Recommend SNF  Barriers to Discharge        Equipment Recommendations  None recommended by PT    Recommendations for Other Services     Frequency Min 4X/week    Precautions / Restrictions Precautions Precautions: Fall Restrictions Weight Bearing Restrictions: No   Pertinent Vitals/Pain No complaints of pain     Mobility  Bed Mobility Bed Mobility: Supine to Sit;Sitting - Scoot to Edge of Bed Supine to Sit: 4: Min assist;With rails Sitting - Scoot to [place] of Bed: 4: Min assist Details for Bed Mobility Assistance: Min assist secondary to pt unable to bear weight through RUE. Assist through trunks Transfers Transfers: Sit to Stand;Stand to Sit Sit to Stand: 4: Min assist;With upper extremity assist;From bed Stand to Sit: 4: Min assist;With upper extremity assist;To chair/3-in-1 Details for Transfer Assistance: Min assist for stability into standing. Cues to avoid RUE weightbearing.  Ambulation/Gait Ambulation/Gait Assistance: 4: Min assist Ambulation Distance (Feet): 20  Feet Assistive device: 2 person hand held assist Ambulation/Gait Assistance Details: 2 person hand held assist secondary to pt unable to bear weight through RUE. One person with HHA on left and one person with assist through elbow on the R. Will attempt RW next session. Min assist secondary to pt being slightly unsteady during gait.  Gait Pattern: Step-to pattern;Narrow base of support;Trunk flexed Gait velocity: decreased gait speed Modified Rankin (Stroke Patients Only) Pre-Morbid Rankin Score: Moderate disability Modified Rankin: Moderately severe disability    Shoulder Instructions     Exercises     PT Diagnosis: Difficulty walking;Abnormality of gait  PT Problem List: Decreased strength;Decreased activity tolerance;Decreased balance;Decreased mobility;Decreased knowledge of use of DME;Decreased safety awareness;Decreased knowledge of precautions PT Treatment Interventions: DME instruction;Gait training;Functional mobility training;Therapeutic activities;Therapeutic exercise;Balance training;Patient/family education   PT Goals Acute Rehab PT Goals PT Goal Formulation: With patient/family Time For Goal Achievement: 07/07/12 Potential to Achieve Goals: Good Pt will go Supine/Side to Sit: with supervision PT Goal: Supine/Side to Sit - Progress: Goal set today Pt will go Sit to Supine/Side: with supervision PT Goal: Sit to Supine/Side - Progress: Goal set today Pt will go Sit to Stand: with supervision PT Goal: Sit to Stand - Progress: Goal set today Pt will go Stand to Sit: with supervision PT Goal: Stand to Sit - Progress: Goal set today Pt will Transfer Bed to Chair/Chair to Bed: with supervision PT Transfer Goal: Bed to Chair/Chair to Bed - Progress: Goal set today Pt will Ambulate: >150 feet;with supervision;with least restrictive assistive device (platform RW) PT Goal: Ambulate - Progress: Goal set today  Visit Information  Last PT Received On: 06/30/12  Assistance Needed:  +2 PT/OT Co-Evaluation/Treatment: Yes    Subjective Data  Patient Stated Goal: to get back to normal   Prior Functioning  Home Living Lives With: Spouse Available Help at Discharge: Other (Comment) (has been at SNF at [facility]) Type of Home: Independent living facility Prior Function Level of Independence: Needs assistance Needs Assistance: Gait;Transfers Gait Assistance: Supervision- Min assistance with platform RW. Long distances Transfer Assistance: Min assistance Comments: Pt has been at [facility] rehab since September for carpal tunnel and foot surgery. Pt is currently not allowed to bear weight through RUE therefore platform RW has been used with therapy.  Communication Communication: No difficulties Dominant Hand: Right    Cognition  Overall Cognitive Status: Appears within functional limits for tasks assessed/performed (ST-dementia baseline) Arousal/Alertness: Lethargic Orientation Level: Appears intact for tasks assessed Behavior During Session: [facility] for tasks performed    Extremity/Trunk Assessment Right Lower Extremity Assessment RLE ROM/Strength/Tone: Deficits RLE ROM/Strength/Tone Deficits: grossly 4/5 RLE Sensation: WFL - Light Touch Left Lower Extremity Assessment LLE ROM/Strength/Tone: Deficits LLE ROM/Strength/Tone Deficits: grossly 4/5 LLE Sensation: WFL - Light Touch   Balance    End of Session PT - End of Session Equipment Utilized During Treatment: Gait belt Activity Tolerance: Patient tolerated treatment well Patient left: in chair;with call bell/phone within reach;with family/visitor present Nurse Communication: Mobility status  GP     Milana Kidney 06/30/2012, 5:41 PM  06/30/2012 Milana Kidney DPT PAGER: [phone] OFFICE: [phone]

## 2012-06-30 NOTE — Progress Notes (Signed)
PT Cancellation Note  Patient Details Name: Alexa Rowe MRN: [id number] DOB: 04-02-1918   Cancelled Treatment:    Reason Eval/Treat Not Completed: Patient not medically ready (Still on strict bedrest.)   Milana Kidney 06/30/2012, 12:51 PM  06/30/2012 Milana Kidney DPT PAGER: [phone] OFFICE: [phone]

## 2012-06-30 NOTE — Progress Notes (Signed)
Speech Language Pathology Dysphagia Treatment Patient Details Name: Alexa Rowe MRN: [id number] DOB: [birth year] Today's Date: 06/30/2012 Time: 1200-1230 SLP Time Calculation (min): 30 min  Assessment / Plan / Recommendation Clinical Impression  Purpose of diagnostic treatment follow up for diet tolerance of regular consistency and thin liquids from initial BSE completed on 06/29/12.  Patient observed at noon meal with assist to self feed due to continued confusion.  No outward s/s of aspiration noted recommed to continue current diet with full supervision with all meals to assist as needed.  ST to sign off as patient has met all goals .  Education completed.     Diet Recommendation  Continue with Current Diet: Regular;Thin liquid    SLP Plan All goals met      Swallowing Goals  SLP Swallowing Goals Swallow Study Goal #1 - Progress: Met Swallow Study Goal #2 - Progress: Met  General Temperature Spikes Noted: No Respiratory Status: Room air Behavior/Cognition: Alert;Cooperative;Pleasant mood Oral Cavity - Dentition: Adequate natural dentition Patient Positioning: Upright in bed  Oral Cavity - Oral Hygiene Brush patient's teeth BID with toothbrush (using toothpaste with fluoride): Yes   Dysphagia Treatment Treatment focused on: Skilled observation of diet tolerance;Patient/family/caregiver education Family/Caregiver Educated: Daughter Treatment Methods/Modalities: Skilled observation;Differential diagnosis Patient observed directly with PO's: Yes Type of PO's observed: Regular;Thin liquids Feeding: Needs assist Liquids provided via: Cup;Straw Type of cueing: Verbal Amount of cueing: Minimal   GO    Moreen Fowler MS, CCC-SLP [phone] [facility] 06/30/2012, 4:46 PM

## 2012-06-30 NOTE — Progress Notes (Signed)
  Echocardiogram 2D Echocardiogram has been performed.  Alexa Rowe 06/30/2012, 11:13 AM

## 2012-06-30 NOTE — Progress Notes (Addendum)
Name: Alexa Rowe MRN: [id number] DOB: 07-09-1918    LOS: 2 PCP Timothy Lasso PULMONARY / CRITICAL CARE MEDICINE  HPI:   78 years old female with PMH relevant for HTN, dyslipidemia and remote history of ovarian and breast cancer. Admitted with acute CVA s/p TPA.   Current Status: Awake and alert. On RA Oliguric overnight better with IVFs  She had not taken much PO 10/5  Vital Signs: Temp:  [97.5 F (36.4 C)-98 F (36.7 C)] 97.5 F (36.4 C) (10/06 0400) Pulse Rate:  [70-108] 84  (10/06 0400) Resp:  [15-26] 18  (10/06 0400) BP: (102-168)/(54-124) 161/73 mmHg (10/06 0400) SpO2:  [92 %-100 %] 99 % (10/06 0400)  Physical Examination: General:  No acute distress Neuro:  Awake, less confused  moves all 4 extremities, no facial droop. HEENT:  PERRL, pink conjunctivae, moist membranes Neck:  Supple, no JVD   Cardiovascular:  RRR, no M/R/G Lungs:  Adequate air entry bilaterally, no W/R/R Abdomen:  Soft, nontender, nondistended, bowel sounds present Musculoskeletal:  Moves all extremities, no pedal edema, 2nd toe amputation bilaterally. Skin:  No rash  Active Problems:  Hypokalemia  CVA (cerebral infarction)   ASSESSMENT AND PLAN  PULMONARY No results found for this basename: PHART:5,PCO2:5,PCO2ART:5,PO2ART:5,HCO3:5,O2SAT:5 in the last 168 hours On RA    CXR: NAD  A:   1) No issues. Able to protect her airway P:   - monitor  CARDIOVASCULAR  Lab 06/28/12 1906  TROPONINI <0.30  LATICACIDVEN --  PROBNP --   ECG: none Lines: Peripheral lines  A:  1) Hypertensive with adequate response to labetalol 10 mg IVP P:  - Will continue labetalol 10 mg IVP q2hrs PRN with goal of SBP 160 to 180 and diastolic < 105  RENAL  Lab 06/29/12 0435 06/28/12 1918 06/28/12 1906  NA 134* 136 132*  K 3.4* 4.2 --  CL 97 100 98  CO2 27 -- 24  BUN 14 21 18   CREATININE 0.68 0.90 0.80  CALCIUM 9.9 -- 10.0  MG 1.7 -- --  PHOS 3.5 -- --   Intake/Output      10/05 0701 - 10/06 0700   I.V. 525   Total Intake 525   Urine 950   Total Output 950   Net -425        Foley:  06/28/12  A:   Oliguria, no urine in bladder on scan>>>better with IVF P:   -mtn IVF  GASTROINTESTINAL  Lab 06/28/12 1906  AST 19  ALT 12  ALKPHOS 61  BILITOT 0.3  PROT 6.9  ALBUMIN 4.0    A:   1) No issues P:   GI prophylaxis with Protonix Passed Swallow eval  HEMATOLOGIC  Lab 06/29/12 0435 06/28/12 1918 06/28/12 1906  HGB 12.0 13.9 13.2  HCT 35.0* 41.0 38.4  PLT 190 -- 209  INR -- -- 0.96  APTT -- -- 31   A:   1) No issues P:  - Follow up CBC  INFECTIOUS  Lab 06/29/12 0435 06/28/12 1906  WBC 11.0* 7.8  PROCALCITON -- --    A:   1) No evidence of acute infectious process P:   - No antibiotics indicated.  ENDOCRINE  Lab 06/28/12 1913  GLUCAP 99   A:   1) No history of diabetes  NEUROLOGIC  A:   1) Acute ischemic stroke P:   - Post TPA - Echocardiogram ordered - Carotid doppler ordered. - Neuro checks q4h  - Speech / swallow evaluation in am -  PT/OT consult -Transfer to floor.  BEST PRACTICE / DISPOSITION - Level of Care:  ICU - Primary Service:  PCCM>>ask Dr Timothy Lasso  to assume primary medical care 10/7 - Consultants:  Neurology - Code Status:  Full code - Diet:  NPO - DVT Px: SCD's - GI Px:  IV protonix - Skin Integrity:  Intact - Social / Family: no family at bedside AM 10/5  The patient is critically ill with multiple organ systems failure and requires high complexity decision making for assessment and support, frequent evaluation and titration of therapies, application of advanced monitoring technologies and extensive interpretation of multiple databases.   Critical Care Time devoted to patient care services described in this note is: 30 min   Caryl Bis  [phone]  Cell  [phone]  If no response or cell goes to voicemail, call beeper [phone]   06/30/2012, 4:36 AM

## 2012-06-30 NOTE — Progress Notes (Signed)
eLink Physician-Brief Progress Note Patient Name: Alexa Rowe DOB: 1918/03/27 MRN: [id number]  Date of Service  06/30/2012   HPI/Events of Note   Oliguria  eICU Interventions  Increased IVF rate   Intervention Category Intermediate Interventions: Oliguria - evaluation and management  Shan Levans 06/30/2012, 12:43 AM

## 2012-06-30 NOTE — Progress Notes (Signed)
Pt's restraints were d/c, patient did not need to restrained.  Patient is calm, cooperative, alert and oriented, she is neuro intact at her baseline, and VSS.

## 2012-06-30 NOTE — Evaluation (Signed)
Occupational Therapy Evaluation Patient Details Name: Alexa Rowe MRN: [id number] DOB: 08-10-1918 Today's Date: 06/30/2012 Time: 2956-2130 OT Time Calculation (min): 19 min  OT Assessment / Plan / Recommendation Clinical Impression  Pt admitted with increased confusion and difficulty speaking, given tpa. Pt admitted from [facility] rehab in which she was recovering from a small toe amputation and carpal tunnel surgery (NWB on RUE). Will benefit from acute OT services to address below problem list in prep for d/c to SNF.    OT Assessment  Patient needs continued OT Services    Follow Up Recommendations  Skilled nursing facility [facility])    Barriers to Discharge      Equipment Recommendations  None recommended by OT    Recommendations for Other Services    Frequency  Min 2X/week    Precautions / Restrictions Precautions Precautions: Fall Restrictions Weight Bearing Restrictions: No   Pertinent Vitals/Pain See vitals    ADL  Grooming: Performed;Brushing hair;Supervision/safety Where Assessed - Grooming: Unsupported sitting Upper Body Dressing: Performed;Minimal assistance Where Assessed - Upper Body Dressing: Unsupported sitting Lower Body Dressing: Performed;Supervision/safety Where Assessed - Lower Body Dressing: Unsupported sitting Toilet Transfer: Simulated;+2 Total assistance Toilet Transfer: Patient Percentage: 80% Toilet Transfer Method: Sit to stand Toilet Transfer Equipment:  (ambulated from bed to chair) Equipment Used: Gait belt Transfers/Ambulation Related to ADLs: Pt ambulate with 2 person HHA.Pt  not able to WB through RUE due to carpal tunnel sx.  One person supported pt's right elbow while second person provided Left HHA.  (pt typically uses platform walker). Pt slightly unsteady and with narrow base of support. ADL Comments: Frequent cueing for NWB on RUE as pt intermittently attempted to push up at beside and chair using RUE. Pt unable to tie front  of gown. Reports having carpal tunnel syndrome in left hand as well making fine motor tasks difficult.     OT Diagnosis: Generalized weakness  OT Problem List: Impaired balance (sitting and/or standing);Decreased activity tolerance OT Treatment Interventions: Self-care/ADL training;DME and/or AE instruction;Therapeutic activities;Patient/family education;Balance training   OT Goals Acute Rehab OT Goals OT Goal Formulation: With patient ADL Goals Pt Will Perform Upper Body Dressing: with modified independence;Sitting, chair;Sitting, bed ADL Goal: Upper Body Dressing - Progress: Goal set today Pt Will Perform Lower Body Dressing: with modified independence;Sit to stand from chair;Sit to stand from bed ADL Goal: Lower Body Dressing - Progress: Goal set today Pt Will Transfer to Toilet: with supervision;Ambulation;with DME;Comfort height toilet ADL Goal: Toilet Transfer - Progress: Goal set today Pt Will Perform Toileting - Clothing Manipulation: with modified independence;Sitting on 3-in-1 or toilet ADL Goal: Toileting - Clothing Manipulation - Progress: Goal set today Pt Will Perform Toileting - Hygiene: with modified independence;Sit to stand from 3-in-1/toilet ADL Goal: Toileting - Hygiene - Progress: Goal set today Miscellaneous OT Goals Miscellaneous OT Goal #1: Pt will independently maintain RUE NWB status during ADL activity. OT Goal: Miscellaneous Goal #1 - Progress: Goal set today  Visit Information  Last OT Received On: 06/30/12 Assistance Needed: +2    Subjective Data      Prior Functioning     Home Living Lives With: Spouse Available Help at Discharge: Other (Comment) (has been at SNF at [facility]) Type of Home: Independent living facility Prior Function Level of Independence: Needs assistance Needs Assistance: Gait;Transfers Gait Assistance: Supervision- Min assistance with platform RW. Long distances Transfer Assistance: Min assistance Comments: Has been  performing bathing/dressing with supervision for safety during standing. Pt has been at [facility] rehab since  September for carpal tunnel and foot surgery. Pt is currently not allowed to bear weight through RUE therefore platform RW has been used with therapy.  Communication Communication: No difficulties Dominant Hand: Right         Vision/Perception     Cognition  Overall Cognitive Status: Appears within functional limits for tasks assessed/performed (ST-dementia baseline) Arousal/Alertness: Lethargic Orientation Level: Appears intact for tasks assessed Behavior During Session: [facility] for tasks performed    Extremity/Trunk Assessment Right Upper Extremity Assessment RUE ROM/Strength/Tone: Unable to fully assess;Due to precautions;WFL for tasks assessed RUE Sensation: WFL - Light Touch Left Upper Extremity Assessment LUE ROM/Strength/Tone: WFL for tasks assessed (3+/5 - 4/5) LUE Sensation: Deficits LUE Sensation Deficits: numb in fingers LUE Coordination: Deficits LUE Coordination Deficits: difficulty tying strings on gown Right Lower Extremity Assessment RLE ROM/Strength/Tone: Deficits RLE ROM/Strength/Tone Deficits: grossly 4/5 RLE Sensation: WFL - Light Touch Left Lower Extremity Assessment LLE ROM/Strength/Tone: Deficits LLE ROM/Strength/Tone Deficits: grossly 4/5 LLE Sensation: WFL - Light Touch     Mobility Bed Mobility Bed Mobility: Supine to Sit;Sitting - Scoot to Edge of Bed Supine to Sit: 4: Min assist;With rails Sitting - Scoot to [place] of Bed: 4: Min assist Details for Bed Mobility Assistance: Min assist secondary to pt unable to bear weight through RUE. Assist through trunks Transfers Sit to Stand: 4: Min assist;With upper extremity assist;From bed Stand to Sit: 4: Min assist;With upper extremity assist;To chair/3-in-1 Details for Transfer Assistance: Min assist for stability into standing. Cues to avoid RUE weightbearing.      Shoulder Instructions       Exercise     Balance     End of Session OT - End of Session Equipment Utilized During Treatment: Gait belt Activity Tolerance: Patient limited by fatigue Patient left: in chair;with call bell/phone within reach;with family/visitor present Nurse Communication: Mobility status  GO   06/30/2012 Cipriano Mile OTR/L Pager [phone] Office [phone]   Cipriano Mile 06/30/2012, 5:55 PM

## 2012-06-30 NOTE — Progress Notes (Signed)
Speech Language Pathology Treatment Patient Details Name: Alexa Rowe MRN: [id number] DOB: [birth year] Today's Date: 06/30/2012 Time: 1200-1230 SLP Time Calculation (min): 30 min   SLP Plan  Discharge SLP treatment due to patient's cognitive status baseline.  Recommend further Speech Language Pathology  Services at next level of care to address cognitive deficits to increase safety in home environment.       SLP Goals  SLP Goals SLP Goal #1 - Progress: Partly met SLP Goal #2 - Progress: Partly met SLP Goal #3 - Progress: Partly met  General Temperature Spikes Noted: No Respiratory Status: Room air Behavior/Cognition: Alert;Cooperative;Pleasant mood;Confused;Requires cueing;Decreased sustained attention Oral Cavity - Dentition: Adequate natural dentition Patient Positioning: Upright in bed   Treatment Treatment focused on: Cognition Skilled Treatment: Treatment focused on improving orientation and problem solving following initial Cognitive Linguistic Evaluation completed on 06/29/12.  Moderate verbal cues required for patient to correctly answer environmental orientation questions. Max verbal and tactile cues required for patient to appropriately use call light.  Family members present bedside report patient 's cognitive skills baseline as patient with short term memory deficits prior to current episode.  ST to sign off as education complete recommend further Speech Language Pathology services at next level of care to address cognitive deficits to increase safety in home environment.      GO    Moreen Fowler MS, CCC-SLP [phone] [facility] 06/30/2012, 4:59 PM

## 2012-07-01 DIAGNOSIS — G459 Transient cerebral ischemic attack, unspecified: Secondary | ICD-10-CM

## 2012-07-01 LAB — HEMOGLOBIN A1C: Hgb A1c MFr Bld: 5.7 % — ABNORMAL HIGH (ref <5.7)

## 2012-07-01 MED ORDER — ASPIRIN EC 81 MG PO TBEC
81.0000 mg | DELAYED_RELEASE_TABLET | Freq: Every day | ORAL | Status: DC
  Administered 2012-07-01 – 2012-07-02 (×2): 81 mg via ORAL
  Filled 2012-07-01 (×2): qty 1

## 2012-07-01 MED ORDER — METOPROLOL SUCCINATE ER 25 MG PO TB24
25.0000 mg | ORAL_TABLET | Freq: Every day | ORAL | Status: DC
  Administered 2012-07-01 – 2012-07-02 (×2): 25 mg via ORAL
  Filled 2012-07-01 (×2): qty 1

## 2012-07-01 MED ORDER — ATORVASTATIN CALCIUM 10 MG PO TABS
10.0000 mg | ORAL_TABLET | Freq: Every day | ORAL | Status: DC
  Administered 2012-07-01: 10 mg via ORAL
  Filled 2012-07-01 (×2): qty 1

## 2012-07-01 NOTE — Progress Notes (Signed)
Occupational Therapy Treatment Patient Details Name: Kyria Bumgardner MRN: [id number] DOB: 09-17-1918 Today's Date: 07/01/2012 Time: 1211-1225 OT Time Calculation (min): 14 min  OT Assessment / Plan / Recommendation    Follow Up Recommendations  Skilled nursing facility;Other (comment) (at [facility])             Frequency Min 2X/week   Plan Discharge plan remains appropriate    Precautions / Restrictions Precautions Precautions: Fall Restrictions Other Position/Activity Restrictions: NWB RUE due to carpel tunnell surgery       ADL  Grooming: Performed;Wash/dry hands;Minimal assistance Where Assessed - Grooming: Supported Copywriter, advertising: Performed;Moderate assistance Toilet Transfer Method: Sit to stand Toilet Transfer Equipment: Comfort height toilet;Grab bars Toileting - Clothing Manipulation and Hygiene: Performed;Minimal assistance Where Assessed - Toileting Clothing Manipulation and Hygiene: Standing Transfers/Ambulation Related to ADLs: Pt with good participation this visit, although needed verbal cues for safety and not to reach back with RUE.  Family stated pt was suppose to see MD regarding heels and ankle this week. Family will call MD office to notify them of pts being in  hospital.      OT Goals ADL Goals ADL Goal: Lower Body Dressing - Progress: Progressing toward goals ADL Goal: Toilet Transfer - Progress: Progressing toward goals ADL Goal: Toileting - Clothing Manipulation - Progress: Progressing toward goals ADL Goal: Toileting - Hygiene - Progress: Progressing toward goals  Visit Information  Last OT Received On: 07/01/12    Subjective Data  Subjective: I would like to get to the bathroom      Cognition  Overall Cognitive Status: History of cognitive impairments - at baseline (some decreased memory noted) Area of Impairment: Memory;Problem solving Arousal/Alertness: Awake/alert Orientation Level: Appears intact for tasks  assessed Problem Solving: Instructed pt to use call bell to call nurse and pt did not seem to understand. Ot provided instruction and reinforcement.    Mobility  Shoulder Instructions Bed Mobility Bed Mobility: Supine to Sit;Sitting - Scoot to Edge of Bed Supine to Sit: 4: Min assist;With rails Sitting - Scoot to [place] of Bed: 4: Min assist Details for Bed Mobility Assistance: Min assist secondary to pt unable to bear weight through RUE. Assist through trunks Transfers Sit to Stand: 4: Min assist;With upper extremity assist;From bed Stand to Sit: 4: Min assist;With upper extremity assist;To chair/3-in-1 Details for Transfer Assistance: Min assist for stability into standing. Cues to avoid RUE weightbearing.              End of Session OT - End of Session Activity Tolerance: Patient tolerated treatment well Patient left: in chair;with family/visitor present;with chair alarm set Nurse Communication: Mobility status       Alba Cory 07/01/2012, 1:02 PM

## 2012-07-01 NOTE — Progress Notes (Signed)
Physical Therapy Treatment Patient Details Name: Alexa Rowe MRN: [id number] DOB: August 08, 1918 Today's Date: 07/01/2012 Time: 4098-1191 PT Time Calculation (min): 35 min  PT Assessment / Plan / Recommendation Comments on Treatment Session  Very pleasant and agreeable for participation as she would like to be able to go to her grandson's wedding this weekend. Discussed this with daughter and patient and educated them on possibilty of bringing a w/c to limit her fatigue as that would put her at an even higher risk of falling.    Follow Up Recommendations  Post acute inpatient     Does the patient have the potential to tolerate intense rehabilitation  No, Recommend SNF (at [facility])  Barriers to Discharge        Equipment Recommendations  None recommended by PT    Recommendations for Other Services    Frequency     Plan Discharge plan remains appropriate;Frequency remains appropriate    Precautions / Restrictions Precautions Precautions: Fall Restrictions Other Position/Activity Restrictions: NWB RUE 2/2 carpel tunnell surgery        Mobility  Bed Mobility Bed Mobility: Not assessed Supine to Sit: 4: Min assist;With rails Sitting - Scoot to Edge of Bed: 4: Min assist Details for Bed Mobility Assistance: pt sitting in chair on presentation Transfers Transfers: Sit to Stand;Stand to Sit Sit to Stand: 3: Mod assist;With upper extremity assist;With armrests;From chair/3-in-1;From toilet Stand to Sit: To chair/3-in-1;To toilet;With upper extremity assist;With armrests;4: Min assist Details for Transfer Assistance: cues for safe hand placement on the left needing hand over hand cueing and verbal cues not to WB through RUE; mod facilitation for power up and stability in standing utilizing LUE HHA Ambulation/Gait Ambulation/Gait Assistance: 4: Min assist Ambulation Distance (Feet): 275 Feet Assistive device: 2 person hand held assist Ambulation/Gait Assistance Details: With  LUE HHA and RUE elbow assist pt ambulated 250 ft with verbal cues for bigger steps and foot clearance, with cues for bigger steps pt had 1 moderate LOB requiring modA to correct; pt also ambulated to the bathroom (approx 25 ft) and back with LUE HHA and minA for stability and negotiation of obstacles Gait Pattern: Trunk flexed;Shuffle General Gait Details: slow shuffled and cautious gait, difficulty reacting to obstacles in the environment    Exercises General Exercises - Lower Extremity Gluteal Sets: AROM;Both;10 reps;Seated Long Arc Quad: AROM;10 reps;Seated;Both Hip ABduction/ADduction: AROM;10 reps;Seated;Both (10 reps each) Hip Flexion/Marching: AROM;Both;10 reps;Seated Toe Raises: AROM Heel Raises: AROM;Both;10 reps;Seated     PT Goals Acute Rehab PT Goals PT Goal: Sit to Stand - Progress: Progressing toward goal PT Goal: Stand to Sit - Progress: Progressing toward goal PT Transfer Goal: Bed to Chair/Chair to Bed - Progress: Progressing toward goal PT Goal: Ambulate - Progress: Progressing toward goal  Visit Information  Last PT Received On: 07/01/12 Assistance Needed: +2    Subjective Data  Subjective: Thank you so much!  Patient Stated Goal: to go to her grandson's wedding this weekend in [place]   Cognition  Overall Cognitive Status: History of cognitive impairments - at baseline Area of Impairment: Memory Arousal/Alertness: Awake/alert Orientation Level: Appears intact for tasks assessed Behavior During Session: [facility] for tasks performed Memory Deficits: baseline memory deficits Problem Solving: Instructed pt to use call bell to call nurse and pt did not seem to understand. Ot provided instruction and reinforcement.    Balance     End of Session PT - End of Session Equipment Utilized During Treatment: Gait belt Activity Tolerance: Patient tolerated treatment well Patient  left: in chair;with call bell/phone within reach Nurse Communication: Mobility status    GP     [facility] HELEN 07/01/2012, 2:41 PM

## 2012-07-01 NOTE — Clinical Social Work Psychosocial (Signed)
     Clinical Social Work Department BRIEF PSYCHOSOCIAL ASSESSMENT 07/01/2012  Patient:  Alexa Rowe, Alexa Rowe     Account Number:  [id number]     Admit date:  06/28/2012  Clinical Social Worker:  Peggyann Shoals  Date/Time:  07/01/2012 02:27 PM  Referred by:  RN  Date Referred:  07/01/2012 Referred for  SNF Placement   Other Referral:   Interview type:  Family Other interview type:    PSYCHOSOCIAL DATA Living Status:  HUSBAND Admitted from facility:  [facility] Level of care:  Independent Living Primary support name:  Remus Horen Primary support relationship to patient:  SPOUSE Degree of support available:   Supportive.    CURRENT CONCERNS Current Concerns  Post-Acute Placement   Other Concerns:    SOCIAL WORK ASSESSMENT / PLAN CSW met with pt and daughter to address consult. CSW introduced herself and explained the role of social work. CSW also explained the process of discharging to a SNF. PT is recommending rehab after this admission. Pt is a resident of [facility]. Pt has very supportive adult children, in addition to her husband.    Pt and pt's daughter are agreeable to discharge to [facility] for rehab. CSW left a message with the social work at [facility] requesting a return phone call. CSW will initiate referral for SNF. CSw will continue to follow.   Assessment/plan status:  Psychosocial Support/Ongoing Assessment of Needs Other assessment/ plan:   Information/referral to community resources:   Pt is well connected to [facility].    PATIENTS/FAMILYS RESPONSE TO PLAN OF CARE: Pt is alert and oriented. Pt is also agreeable to to ST-SNF at [facility].

## 2012-07-01 NOTE — Progress Notes (Addendum)
Subjective: S/P Admission for CVA - S/P successful TPA. Was in ICU and transferred out to me yesterday. After the Ativan wore off her restraints were d/ced, patient does not need to be restrained at this moment. Patient remains calm, cooperative, alert and oriented, she is neuro intact at her baseline. She was seen by PT/OT and recommended possible SNF at [facility] on D/C Currently no complaints sitting up eating in chair.  Looks great.  Min confusion.   Objective: Vital signs in last 24 hours: Temp:  [98.1 F (36.7 C)-98.6 F (37 C)] 98.6 F (37 C) (10/07 0521) Pulse Rate:  [71-100] 89  (10/07 0521) Resp:  [18-27] 18  (10/07 0521) BP: (122-174)/(64-84) 122/74 mmHg (10/07 0521) SpO2:  [98 %-100 %] 98 % (10/07 0521) Weight:  [54.8 kg (120 lb 13 oz)] 54.8 kg (120 lb 13 oz) (10/06 0900) Weight change: 1.4 kg (3 lb 1.4 oz) Last BM Date: 06/30/12  CBG (last 3)   Basename 06/28/12 1913  GLUCAP 99    Intake/Output from previous day:  Intake/Output Summary (Last 24 hours) at 07/01/12 0728 Last data filed at 06/30/12 1800  Gross per 24 hour  Intake    375 ml  Output    750 ml  Net   -375 ml   10/06 0701 - 10/07 0700 In: 375 [I.V.:375] Out: 750 [Urine:750]   Physical Exam  General appearance: appears younger than stated age. Eyes: no scleral icterus Throat: oropharynx moist without erythema.  Tongue midline. Resp: CTA Cardio: Reg.  No Bruits. GI: soft, non-tender; bowel sounds normal; no masses,  no organomegaly Extremities: no clubbing, cyanosis or edema   Lab Results:  [facility] 06/30/12 0445 06/29/12 0435  NA 136 134*  K 3.4* 3.4*  CL 100 97  CO2 25 27  GLUCOSE 89 100*  BUN 14 14  CREATININE 0.72 0.68  CALCIUM 9.6 9.9  MG -- 1.7  PHOS -- 3.5     Basename 06/28/12 1906  AST 19  ALT 12  ALKPHOS 61  BILITOT 0.3  PROT 6.9  ALBUMIN 4.0     Basename 06/30/12 0445 06/29/12 0435 06/28/12 1906  WBC 6.0 11.0* --  NEUTROABS -- -- 4.5  HGB 12.1 12.0 --    HCT 35.6* 35.0* --  MCV 86.2 85.8 --  PLT 184 190 --    Lab Results  Component Value Date   INR 0.96 06/28/2012     Basename 06/28/12 1906  CKTOTAL --  CKMB --  CKMBINDEX --  TROPONINI <0.30     Basename 06/30/12 0445  TSH 3.457  T4TOTAL --  T3FREE --  THYROIDAB --    No results found for this basename: VITAMINB12:2,FOLATE:2,FERRITIN:2,TIBC:2,IRON:2,RETICCTPCT:2 in the last 72 hours  Micro Results: Recent Results (from the past 240 hour(s))  MRSA PCR SCREENING     Status: Normal   Collection Time   06/29/12  8:00 AM      Component Value Range Status Comment   MRSA by PCR NEGATIVE  NEGATIVE Final      Studies/Results: Ct Head Wo Contrast  06/29/2012  *RADIOLOGY REPORT*  Clinical Data: Follow-up code stroke.  CT HEAD WITHOUT CONTRAST  Technique:  Contiguous axial images were obtained from the base of the skull through the vertex without contrast.  Comparison: 06/28/2012  Findings: The brain shows generalized atrophy.  There are chronic small vessel changes within the deep white matter.  No sign of acute infarction, mass lesion, hemorrhage, hydrocephalus or extra- axial collection.  There is atherosclerotic calcification  of the major vessels at the base of the brain.  Sinuses are clear.  No calvarial abnormality.  IMPRESSION: Atrophy and chronic small vessel change.  No acute finding.   Original Report Authenticated By: Thomasenia Sales, M.D.    Mr Maxine Glenn Head Wo Contrast  06/30/2012  *RADIOLOGY REPORT*  Clinical Data:  Stroke.  Mental status changes. Acute but ill- defined vascular disease.  MRI HEAD WITHOUT CONTRAST MRA HEAD WITHOUT CONTRAST  Technique:  Multiplanar, multiecho pulse sequences of the brain and surrounding structures were obtained without intravenous contrast. Angiographic images of the head were obtained using MRA technique without contrast.  Comparison:  Head CT 06/29/2012  MRI HEAD  Findings:  Diffusion imaging does not show any acute or subacute infarction.   There is generalized brain atrophy.  No focal insult of the brainstem or cerebellum.  The cerebral hemispheres show chronic small vessel changes within the deep and subcortical white matter.  There is hemosiderin deposition in the left temporal lobe related to an old small vessel infarction.  No mass lesion, acute hemorrhage, hydrocephalus or extra-axial collection.  No pituitary mass.  No inflammatory sinus disease.  IMPRESSION: No acute or reversible findings.  Atrophy and chronic small vessel disease.  MRA HEAD  Findings: Both internal carotid arteries are widely patent into the brain.  No siphon stenosis.  The anterior and middle cerebral vessels are patent without proximal stenosis, aneurysm or vascular malformation.  More distal branch vessels show atherosclerotic irregularity.  The right vertebral artery is a small vessel that terminates in pica.  The left vertebral artery is the dominant vessel widely patent to the basilar.  No basilar stenosis.  Posterior circulation branch vessels are intact, with the left PCA receiving most of its supply from the anterior circulation.  More distal branch vessels show atherosclerotic irregularity.  IMPRESSION: No major vessel occlusion or correctable proximal stenosis. Diffuse distal vessel atherosclerotic irregularity.   Original Report Authenticated By: Thomasenia Sales, M.D.    Mr Brain Wo Contrast  06/30/2012  *RADIOLOGY REPORT*  Clinical Data:  Stroke.  Mental status changes. Acute but ill- defined vascular disease.  MRI HEAD WITHOUT CONTRAST MRA HEAD WITHOUT CONTRAST  Technique:  Multiplanar, multiecho pulse sequences of the brain and surrounding structures were obtained without intravenous contrast. Angiographic images of the head were obtained using MRA technique without contrast.  Comparison:  Head CT 06/29/2012  MRI HEAD  Findings:  Diffusion imaging does not show any acute or subacute infarction.  There is generalized brain atrophy.  No focal insult of the  brainstem or cerebellum.  The cerebral hemispheres show chronic small vessel changes within the deep and subcortical white matter.  There is hemosiderin deposition in the left temporal lobe related to an old small vessel infarction.  No mass lesion, acute hemorrhage, hydrocephalus or extra-axial collection.  No pituitary mass.  No inflammatory sinus disease.  IMPRESSION: No acute or reversible findings.  Atrophy and chronic small vessel disease.  MRA HEAD  Findings: Both internal carotid arteries are widely patent into the brain.  No siphon stenosis.  The anterior and middle cerebral vessels are patent without proximal stenosis, aneurysm or vascular malformation.  More distal branch vessels show atherosclerotic irregularity.  The right vertebral artery is a small vessel that terminates in pica.  The left vertebral artery is the dominant vessel widely patent to the basilar.  No basilar stenosis.  Posterior circulation branch vessels are intact, with the left PCA receiving most of its supply from the  anterior circulation.  More distal branch vessels show atherosclerotic irregularity.  IMPRESSION: No major vessel occlusion or correctable proximal stenosis. Diffuse distal vessel atherosclerotic irregularity.   Original Report Authenticated By: Thomasenia Sales, M.D.      Medications: Scheduled:   . LORazepam  1 mg Intravenous Q8H  . pantoprazole  40 mg Oral Q1200  . pantoprazole sodium  40 mg Oral QHS  . DISCONTD: pantoprazole (PROTONIX) IV  40 mg Intravenous QHS   Continuous:    Assessment/Plan: Active Problems:  Hypokalemia  CVA (cerebral infarction)   CVA - L Brain CVA S/P TPA.  No Further Aphasia.  She is near baseline.  Goal is BP control and anti-platelets.  She was on ASA on D/c from hospital end of July and I do not remember taking her off.  I will check outpatient records.  ECHO:- Left ventricle: The cavity size was normal. There was mild concentric hypertrophy. Systolic function was normal.  The estimated ejection fraction was in the range of 55% to 60%. Wall motion was normal; there were no regional wall motion abnormalities. - Aortic valve: Mildly to moderately calcified annulus. Mildly calcified leaflets. Mild regurgitation. - Mitral valve: Calcified annulus. - Atrial septum: There was an atrial septal aneurysm.  MRI No acute or reversible findings. Atrophy and chronic small vessel disease. MRA: No major vessel occlusion or correctable proximal stenosis.  Diffuse distal vessel atherosclerotic irregularity.  Carotid Doppler pending  Some agitation earlier in hospital and s/p Ativan which may have affected her mental state?  HTN - S/P IV Labetolol.  Outpt Toprol/Verapamil. ? HCTZ.  Will review and taylor her BP med list to her needs.  Restart some home meds.  Lipids - Continue statin.  OA - On Celebrex - Discontinue as can increase BP response.   E Coli Pyelonephritis in July.    GERD. PPI  Electrolyte issues.  Recheck in am.  CW/SW consult to help facilitate [facility] tomorrow/Wednedsday? If needed.  DVT Prophylaxis - SCDs.    LOS: 3 days   Trudi Morgenthaler M 07/01/2012, 7:28 AM   OVERACTIVE BLADDER (ICD-596.51) potty seat. Staying Hydrated Off Diuretics.  ALTERED MENTAL STATUS (ICD-780.97) Recent issues with AMS and pyelonephritis. ? Early dementia that gets worse with illness/Hospitalization.  ABNORMALITY OF GAIT (ICD-781.2) -- S/p PT - post B Feet and wrist surgery.  HYPERTENSION (ICD-401.9) eval from last OV note B/P: 114/82  off the HCTZ and verapamil.  The following medications were removed from the medication list:    Hydrochlorothiazide 25 Mg Tabs (Hydrochlorothiazide) .Marland Kitchen... 1 tab daily except sunday  *hold*    Metoprolol Tartrate 50 Mg Tabs (Metoprolol tartrate) .Marland Kitchen... 1 po bid  Her updated medication list for this problem includes:    Verapamil Hcl Cr 180 Mg Cr-tabs (Verapamil hcl) .Marland Kitchen... 1 tab daily  OSTEOARTHRITIS (ICD-715.90) and  RA Has seen Rheum.    Verapamil 180, Simva 40, Ranitidine 150, Vit D 1000

## 2012-07-01 NOTE — Progress Notes (Signed)
07/01/2012 1200 Pt is from [facility] and plan is D/C to SNF. CSW following for d/c to SNF. Isidoro Donning RN CCM Case Mgmt phone [phone]

## 2012-07-01 NOTE — Progress Notes (Signed)
VASCULAR LAB PRELIMINARY  PRELIMINARY  PRELIMINARY  PRELIMINARY  Carotid duplex  completed.    Preliminary report: Mild plaque in the carotid bulb area and proximal ICA.  No ICA stenosis noted                          Alexa Rowe, 07/01/2012, 10:11 AM

## 2012-07-01 NOTE — Progress Notes (Signed)
Stroke Team Progress Note  HISTORY  Alexa Rowe is an 78 y.o. female who was in her USOH today until 5:45 PM, when she developed sudden onset of what the family describes as confusion. At her baseline, she is independent with normal speech. 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 Specific features of the apparent confusion included inability to follow verbal commands, paraphasias, and incomprehensible speech. The patient could at times verbalize short, understandable statements fluently, but not in relation to direct questions and apparently only as a response to internal stimuli, with no indication of a clear thought process. She was transported to [facility] and code stroke initiated. She is not on anticoagulants at home, per family.   Her PMHx includes HTN, dyslipidemia and remote history of ovarian and breast cancer. Her systolic blood pressure in the ED was as high as the low 200's and was brought below 185 with IV labetalol.   The patient was seen by Dr. Otelia Limes in the ED and felt to be a tPA candidate as she fell within the 3 hour time window.  [id number] Adminstration time tPA: 1945 (per my review of medication order sheet)  SUBJECTIVE  She is at bedside in wheelchair returning from testing.  Overall she feels her condition is rapidly improving.   OBJECTIVE Most recent Vital Signs: Filed Vitals:   06/30/12 1823 06/30/12 2136 07/01/12 0200 07/01/12 0521  BP: 160/72 142/74 138/66 122/74  Pulse: 90 90 88 89  Temp: 98.2 F (36.8 C) 98.2 F (36.8 C) 98.1 F (36.7 C) 98.6 F (37 C)  TempSrc: Oral Oral Oral Oral  Resp: 18 18 18 18   Height:      Weight:      SpO2: 100% 100% 100% 98%   CBG (last 3)   Basename 06/28/12 1913  GLUCAP 99   Intake/Output from previous day: 10/06 0701 - 10/07 0700 In: 375 [I.V.:375] Out: 750 [Urine:750]  IV Fluid Intake:     MEDICATIONS     . atorvastatin  10 mg Oral q1800  . metoprolol succinate  25 mg Oral Daily  . pantoprazole  40 mg Oral Q1200  . DISCONTD: LORazepam  1 mg Intravenous Q8H  . DISCONTD: pantoprazole (PROTONIX) IV  40 mg Intravenous QHS  . DISCONTD: pantoprazole sodium  40 mg Oral QHS   PRN:  sodium chloride, labetalol  Diet:  General thin liquids Activity:  Ambulate DVT Prophylaxis:  SCD  CLINICALLY SIGNIFICANT STUDIES Basic Metabolic Panel:  Lab 06/30/12 1308 06/29/12 0435  NA 136 134*  K 3.4* 3.4*  CL 100 97  CO2 25 27  GLUCOSE 89 100*  BUN 14 14  CREATININE 0.72 0.68  CALCIUM 9.6 9.9  MG -- 1.7  PHOS -- 3.5   Liver Function Tests:  Lab 06/28/12 1906  AST 19  ALT 12  ALKPHOS 61  BILITOT 0.3  PROT 6.9  ALBUMIN 4.0   CBC:  Lab 06/30/12 0445 06/29/12 0435 06/28/12 1906  WBC 6.0 11.0* --  NEUTROABS -- -- 4.5  HGB 12.1 12.0 --  HCT 35.6* 35.0* --  MCV 86.2 85.8 --  PLT 184 190 --   Coagulation:  Lab 06/28/12 1906  LABPROT 12.7  INR 0.96   Cardiac Enzymes:  Lab 06/28/12 1906  CKTOTAL --  CKMB --  CKMBINDEX --  TROPONINI <0.30   Urinalysis:  Lab 06/28/12 1958  COLORURINE YELLOW  LABSPEC 1.009  PHURINE  7.0  GLUCOSEU NEGATIVE  HGBUR TRACE*  BILIRUBINUR NEGATIVE  KETONESUR NEGATIVE  PROTEINUR NEGATIVE  UROBILINOGEN 0.2  NITRITE NEGATIVE  LEUKOCYTESUR NEGATIVE    Ct Head Wo Contrast 06/29/2012  Atrophy and chronic small vessel change.  No acute finding.     Mr Maxine Glenn Head/Brain Wo Contrast 06/30/2012 No acute or reversible findings.  Atrophy and chronic small vessel disease. No major vessel occlusion or correctable proximal stenosis. Diffuse distal vessel atherosclerotic irregularity.     Carotid Doppler  2D Echo: Left ventricle: The cavity size was normal. There was mild concentric hypertrophy. Systolic function was normal. The estimated ejection fraction was in the range of 55% to 60%. Wall motion was normal; there were no regional wall motion abnormalities. -  Aortic valve: Mildly to moderately calcified annulus.   Mildly calcified leaflets. Mild regurgitation. - Mitral valve: Calcified annulus. - Atrial septum: There was an atrial septal aneurysm.    CXR  No acute cardiopulmonary disease.   EKG  normal sinus rhythm.   Therapy Recommendations PT -; OT - ; ST - SNF  Physical Exam   Frail elderly lady not in distress.Awake alert. Afebrile. Head is nontraumatic. Neck is supple without bruit. Hearing is diminishedl. Cardiac exam no murmur or gallop. Lungs are clear to auscultation. Distal pulses are well felt.  Neurological Exam : Awake  Alert oriented x 1.diminished attention, registration. Diminished recall 0/3. Able to name only 5 animals. Can follow only simple midline and a few one-step commands. Normal speech and language.eye movements full without nystagmus. Face symmetric. Tongue midline. Normal strength, tone, reflexes and coordination. Normal sensation. Gait deferred.  ASSESSMENT [name] is a 78 y.o. female presenting with acute onset receptive aphasia. Status post IV t-PA at 1945. Imaging confirms chronic small vessel disease.No definite infarct identified on imaging etiology probably acute delirium  Superimposed upon baseline mild chronic dementia with other underlying etiology.  Work up underway. On none prior to admission. Now on aspirin 81 mg orally every day for secondary stroke prevention. Patient with resultant acute delirium on chronic dementia.   Acute delirium on chronic dementia  Acute onset receptive aphasia  Hypertension  Hyperlipidemia  Remote history of ovarian and breast cancer  Hospital day # 3  TREATMENT/PLAN  Continue aspirin 81 mg orally every day for secondary stroke prevention.  Lipid, Hgb A1C pending  Risk factor modification  Await carotid dopplers  Therapy evaluations   Guy Franco, [facility],  MBA, MHA [facility] Pager: [phone] 07/01/2012 10:25 AM  Scribe for Dr.  Delia Heady, Stroke Center Medical Director. He has personally reviewed chart, pertinent data, examined the patient and developed the plan of care. Pager:  [phone]

## 2012-07-02 LAB — CBC
HCT: 35.4 % — ABNORMAL LOW (ref 36.0–46.0)
Hemoglobin: 12.1 g/dL (ref 12.0–15.0)
MCV: 87 fL (ref 78.0–100.0)
RBC: 4.07 MIL/uL (ref 3.87–5.11)
WBC: 6.8 10*3/uL (ref 4.0–10.5)

## 2012-07-02 LAB — BASIC METABOLIC PANEL
CO2: 27 mEq/L (ref 19–32)
Chloride: 102 mEq/L (ref 96–112)
Creatinine, Ser: 0.71 mg/dL (ref 0.50–1.10)
GFR calc Af Amer: 84 mL/min — ABNORMAL LOW (ref >90)
Potassium: 3.8 mEq/L (ref 3.5–5.1)
Sodium: 137 mEq/L (ref 135–145)

## 2012-07-02 LAB — LIPID PANEL
HDL: 61 mg/dL (ref >39)
LDL Cholesterol: 64 mg/dL (ref 0–99)
Triglycerides: 78 mg/dL (ref <150)

## 2012-07-02 MED ORDER — ASPIRIN 81 MG PO TBEC: 81.0000 mg | DELAYED_RELEASE_TABLET | Freq: Every day | ORAL | Status: AC

## 2012-07-02 NOTE — Discharge Summary (Signed)
Physician Discharge Summary  DISCHARGE SUMMARY   Patient ID: Alexa Rowe MR#: [id number] DOB/AGE: 1918-06-30 78 y.o.   Attending Physician:Aveya Beal M  Patient's ZHY:QMVHQ,IONG M, MD  Consults:Treatment Team:  Md Stroke, MD**  Admit date: 06/28/2012 Discharge date: 07/02/2012  Discharge Diagnoses:  Active Problems:  Hypokalemia  CVA (cerebral infarction)   Patient Active Problem List  Diagnosis  . Pyelonephritis, acute  . Unspecified essential hypertension  . Other and unspecified hyperlipidemia  . Dehydration  . Weakness generalized  . Malignant neoplasm of ovary  . Malignant neoplasm of breast (female), unspecified site  . Aortic valve disorders  . Mitral valve insufficiency and aortic valve insufficiency  . Osteoarthrosis, unspecified whether generalized or localized, unspecified site  . Disorder of bone and cartilage, unspecified  . Unspecified transient cerebral ischemia  . Rheumatoid arthritis  . Esophageal reflux  . Abnormality of gait  . Hypokalemia  . Hyponatremia  . CVA (cerebral infarction)   Past Medical History  Diagnosis Date  . Hypertension   . Arthritis     Discharged Condition: stable   Discharge Medications:   Medication List     As of 07/02/2012 12:26 PM    STOP taking these medications         celecoxib 200 MG capsule   Commonly known as: CELEBREX      HYDROcodone-acetaminophen 5-325 MG per tablet   Commonly known as: NORCO/VICODIN      TAKE these medications         aspirin 81 MG EC tablet   Take 1 tablet (81 mg total) by mouth daily.      atorvastatin 10 MG tablet   Commonly known as: LIPITOR   Take 10 mg by mouth at bedtime.      metoprolol 50 MG tablet   Commonly known as: LOPRESSOR   Take 50 mg by mouth 2 (two) times daily.      ranitidine 150 MG tablet   Commonly known as: ZANTAC   Take 150 mg by mouth daily as needed. For upset stomach      senna-docusate 8.6-50 MG per tablet   Commonly known as:  Senokot-S   Take 1 tablet by mouth at bedtime.      verapamil 180 MG 24 hr capsule   Commonly known as: VERELAN PM   Take 180 mg by mouth daily.        Hospital Procedures: Ct Head Wo Contrast  06/29/2012  *RADIOLOGY REPORT*  Clinical Data: Follow-up code stroke.  CT HEAD WITHOUT CONTRAST  Technique:  Contiguous axial images were obtained from the base of the skull through the vertex without contrast.  Comparison: 06/28/2012  Findings: The brain shows generalized atrophy.  There are chronic small vessel changes within the deep white matter.  No sign of acute infarction, mass lesion, hemorrhage, hydrocephalus or extra- axial collection.  There is atherosclerotic calcification of the major vessels at the base of the brain.  Sinuses are clear.  No calvarial abnormality.  IMPRESSION: Atrophy and chronic small vessel change.  No acute finding.   Original Report Authenticated By: Thomasenia Sales, M.D.    Ct Head Wo Contrast  06/28/2012  *RADIOLOGY REPORT*  Clinical Data: Code stroke.  Hypertension.  Combative.  CT HEAD WITHOUT CONTRAST  Technique:  Contiguous axial images were obtained from the base of the skull through the vertex without contrast.  Comparison: None.  Findings: There is moderate central cortical atrophy. Periventricular white matter changes are consistent with small vessel disease. There  is no evidence for hemorrhage, mass lesion, or acute infarction.  Bone windows are unremarkable.  There is dense atherosclerotic calcification of the internal carotid arteries.  IMPRESSION:  1.  Atrophy and small vessel disease. 2. No evidence for acute intracranial abnormality.  Critical test results telephoned to Dr. Colbert Coyer at the time of interpretation on date 06/28/2012 at time 7:19 p.m.   Original Report Authenticated By: Patterson Hammersmith, M.D.    Mr [facility] Wo Contrast  06/30/2012  *RADIOLOGY REPORT*  Clinical Data:  Stroke.  Mental status changes. Acute but ill- defined vascular disease.  MRI  HEAD WITHOUT CONTRAST MRA HEAD WITHOUT CONTRAST  Technique:  Multiplanar, multiecho pulse sequences of the brain and surrounding structures were obtained without intravenous contrast. Angiographic images of the head were obtained using MRA technique without contrast.  Comparison:  Head CT 06/29/2012  MRI HEAD  Findings:  Diffusion imaging does not show any acute or subacute infarction.  There is generalized brain atrophy.  No focal insult of the brainstem or cerebellum.  The cerebral hemispheres show chronic small vessel changes within the deep and subcortical white matter.  There is hemosiderin deposition in the left temporal lobe related to an old small vessel infarction.  No mass lesion, acute hemorrhage, hydrocephalus or extra-axial collection.  No pituitary mass.  No inflammatory sinus disease.  IMPRESSION: No acute or reversible findings.  Atrophy and chronic small vessel disease.  MRA HEAD  Findings: Both internal carotid arteries are widely patent into the brain.  No siphon stenosis.  The anterior and middle cerebral vessels are patent without proximal stenosis, aneurysm or vascular malformation.  More distal branch vessels show atherosclerotic irregularity.  The right vertebral artery is a small vessel that terminates in pica.  The left vertebral artery is the dominant vessel widely patent to the basilar.  No basilar stenosis.  Posterior circulation branch vessels are intact, with the left PCA receiving most of its supply from the anterior circulation.  More distal branch vessels show atherosclerotic irregularity.  IMPRESSION: No major vessel occlusion or correctable proximal stenosis. Diffuse distal vessel atherosclerotic irregularity.   Original Report Authenticated By: Thomasenia Sales, M.D.    Mr Brain Wo Contrast  06/30/2012  *RADIOLOGY REPORT*  Clinical Data:  Stroke.  Mental status changes. Acute but ill- defined vascular disease.  MRI HEAD WITHOUT CONTRAST MRA HEAD WITHOUT CONTRAST  Technique:   Multiplanar, multiecho pulse sequences of the brain and surrounding structures were obtained without intravenous contrast. Angiographic images of the head were obtained using MRA technique without contrast.  Comparison:  Head CT 06/29/2012  MRI HEAD  Findings:  Diffusion imaging does not show any acute or subacute infarction.  There is generalized brain atrophy.  No focal insult of the brainstem or cerebellum.  The cerebral hemispheres show chronic small vessel changes within the deep and subcortical white matter.  There is hemosiderin deposition in the left temporal lobe related to an old small vessel infarction.  No mass lesion, acute hemorrhage, hydrocephalus or extra-axial collection.  No pituitary mass.  No inflammatory sinus disease.  IMPRESSION: No acute or reversible findings.  Atrophy and chronic small vessel disease.  MRA HEAD  Findings: Both internal carotid arteries are widely patent into the brain.  No siphon stenosis.  The anterior and middle cerebral vessels are patent without proximal stenosis, aneurysm or vascular malformation.  More distal branch vessels show atherosclerotic irregularity.  The right vertebral artery is a small vessel that terminates in pica.  The left  vertebral artery is the dominant vessel widely patent to the basilar.  No basilar stenosis.  Posterior circulation branch vessels are intact, with the left PCA receiving most of its supply from the anterior circulation.  More distal branch vessels show atherosclerotic irregularity.  IMPRESSION: No major vessel occlusion or correctable proximal stenosis. Diffuse distal vessel atherosclerotic irregularity.   Original Report Authenticated By: Thomasenia Sales, M.D.    Dg Chest Port 1 View  06/29/2012  *RADIOLOGY REPORT*  Clinical Data: Stroke symptoms  PORTABLE CHEST - 1 VIEW  Comparison: None.  Findings: The cardiac silhouette is normal in size and configuration.  No mediastinal or hilar mass or adenopathy.  Mild scarring at the  apices.  The lungs are otherwise clear.  Small surgical vascular clips project over the left anterior chest wall consistent with previous left breast surgery.  The bony thorax is demineralized but intact.  IMPRESSION: No acute cardiopulmonary disease.   Original Report Authenticated By: Domenic Moras, M.D.     History of Present Illness: 78 years old female with PMH relevant for HTN, dyslipidemia and remote history of ovarian and breast cancer. Presented to ED with AMS characterized by confusion and dysarthria. She was brought to the ED and stroke alert was called. She received TPA. Her systolic blood pressure had been As high as 200 with good response to 10 mg of IV labetalol. Critical care consult was called to admit to the ICU for close monitoring and BP control. At the time of their exam the patient was awake, oriented only in person, confused, intermittently able to follow simple commands. Hemodynamically stable, saturating 98% on RA, able to protect her airway. At baseline the patient is independent and completely functional.    Hospital Course: S/P Admission for CVA - S/P successful TPA.  Was in ICU on admit and transferred out to me yesterday. After the Ativan wore off her restraints were d/ced, patient does not need to be restrained. Patient has remained calm, cooperative, alert and oriented, she is neuro intact at her baseline.  She was seen by PT/OT and recommended possible SNF at [facility] on D/C   For her L Brain CVA - She is S/P TPA and no Further Aphasia. She did well and looks great and is at her baseline.  Goal is BP control and anti-platelets. She was on ASA on D/c from hospital end of July and I do not remember taking her off but on arrival it was not on her med list.  SHe was placed back on. She will need ASA on D/C. A1C only 5.7 and Normal = No DM2.  W/Up included:   ECHO:- Left ventricle: The cavity size was normal. There was mild concentric hypertrophy. Systolic function  was normal. The estimated ejection fraction was in the range of 55% to 60%. Wall motion was normal; there were no regional wall motion abnormalities. - Aortic valve: Mildly to moderately calcified annulus. Mildly calcified leaflets. Mild regurgitation.  - Mitral valve: Calcified annulus. - Atrial septum: There was an atrial septal aneurysm.   MRI No acute or reversible findings. Atrophy and chronic small vessel disease.  MRA: No major vessel occlusion or correctable proximal stenosis. Diffuse distal vessel atherosclerotic irregularity.   Carotid Doppler = Mild plaque in the carotid bulb area and proximal ICA. No ICA stenosis   CXR No acute cardiopulmonary disease.  EKG normal sinus rhythm.   Some agitation earlier in hospital and s/p Ativan which may have affected her mental state? She is  starting to exhibit mild dementia at baseline and memory gets worse when acutely ill.   Per Nursing Dr Pearlean Brownie wanted EEG for today?  It was reported not needed and She can go to [facility] today.  HTN - S/P IV Labetolol given initially. Outpt Toprol/Verapamil restarted. off HCTZ from summer when she had hyponatrmia. Watch BP and HR. No Diuretics needed.   Lipids - Continue statin.   E Coli Pyelonephritis in July. UA on 10/4 was (-)   GERD. PPI   Electrolyte issues. CBC and BMET fine today  CW/SW consulted to help facilitate [facility] Wednesday.  FL2 signed.  DVT Prophylaxis - SCDs provided  OVERACTIVE BLADDER - staying away from meds which may affect mind and memory.   OSTEOARTHRITIS (ICD-715.90) and RA.  Celebrex - Discontinue as can increase BP response.  Has seen Rheum.   She looked great this am on am rounds and can be D/Ced to Rehab.  She will follow up with med when out of rehab     Day of Discharge Exam BP 134/69  Pulse 83  Temp 97.5 F (36.4 C) (Oral)  Resp 18  Ht 5\' 2"  (1.575 m)  Wt 54.8 kg (120 lb 13 oz)  BMI 22.10 kg/m2  SpO2 98%  See todays PN   Discharge  Labs:  [facility] 07/02/12 0550 06/30/12 0445  NA 137 136  K 3.8 3.4*  CL 102 100  CO2 27 25  GLUCOSE 88 89  BUN 12 14  CREATININE 0.71 0.72  CALCIUM 9.6 9.6  MG -- --  PHOS -- --   No results found for this basename: AST:2,ALT:2,ALKPHOS:2,BILITOT:2,PROT:2,ALBUMIN:2 in the last 72 hours  Basename 07/02/12 0550 06/30/12 0445  WBC 6.8 6.0  NEUTROABS -- --  HGB 12.1 12.1  HCT 35.4* 35.6*  MCV 87.0 86.2  PLT 198 184   No results found for this basename: CKTOTAL:3,CKMB:3,CKMBINDEX:3,TROPONINI:3 in the last 72 hours  Basename 06/30/12 0445  TSH 3.457  T4TOTAL --  T3FREE --  THYROIDAB --   No results found for this basename: VITAMINB12:2,FOLATE:2,FERRITIN:2,TIBC:2,IRON:2,RETICCTPCT:2 in the last 72 hours Lab Results  Component Value Date   INR 0.96 06/28/2012       Discharge instructions:  04-Intermediate Care Facility    Disposition: [facility]  Follow-up Appts: Follow-up with Dr. Timothy Lasso at [facility] in 1-2 weeks post D/C from SNF.  Call for appointment.  Condition on Discharge: stable  Tests Needing Follow-up: none  Time spent in discharge (includes decision making & examination of pt): 30 min  Signed: Nuala Chiles M 07/02/2012, 12:26 PM

## 2012-07-02 NOTE — Progress Notes (Signed)
Physical Therapy Treatment Patient Details Name: Alexa Rowe MRN: [id number] DOB: March 21, 1918 Today's Date: 07/02/2012 Time: 2130-8657 PT Time Calculation (min): 30 min  PT Assessment / Plan / Recommendation Comments on Treatment Session  Moving much better today using platform RW.     Follow Up Recommendations  Post acute inpatient     Does the patient have the potential to tolerate intense rehabilitation  No, Recommend SNF  Barriers to Discharge        Equipment Recommendations  None recommended by PT    Recommendations for Other Services    Frequency     Plan Discharge plan remains appropriate;Frequency remains appropriate    Precautions / Restrictions Precautions Precautions: Fall Restrictions RUE Weight Bearing: Non weight bearing       Mobility  Bed Mobility Bed Mobility: Not assessed Transfers Transfers: Sit to Stand;Stand to Sit Sit to Stand: 4: Min assist;With upper extremity assist;From chair/3-in-1;From toilet Stand to Sit: 4: Min assist;With armrests;To chair/3-in-1;To toilet Details for Transfer Assistance: cues for safe hand placement and NWB of RUE; facilitation of at trunk for anterior translation over BOS and tall posture Ambulation/Gait Ambulation/Gait Assistance: 4: Min guard Ambulation Distance (Feet): 200 Feet Assistive device: Right platform walker Ambulation/Gait Assistance Details: cues for tall posture and safe use of RW, much improved fluidity of gait with use of RW Gait Pattern: Step-through pattern;Shuffle      PT Goals Acute Rehab PT Goals PT Goal: Sit to Stand - Progress: Progressing toward goal PT Goal: Stand to Sit - Progress: Progressing toward goal PT Transfer Goal: Bed to Chair/Chair to Bed - Progress: Progressing toward goal PT Goal: Ambulate - Progress: Progressing toward goal  Visit Information  Last PT Received On: 07/02/12 Assistance Needed: +1    Subjective Data  Subjective: Well I don't know that I remember  working with you yesterday, but thank you.    Cognition  Overall Cognitive Status: History of cognitive impairments - at baseline Area of Impairment: Memory Arousal/Alertness: Awake/alert Orientation Level: Appears intact for tasks assessed Behavior During Session: [facility] for tasks performed Memory Deficits: baseline memory deficits    Balance     End of Session PT - End of Session Equipment Utilized During Treatment: Gait belt Activity Tolerance: Patient tolerated treatment well Patient left: in chair;with call bell/phone within reach;with nursing in room Nurse Communication: Mobility status   GP     [facility] HELEN 07/02/2012, 11:48 AM

## 2012-07-02 NOTE — Progress Notes (Signed)
Occupational Therapy Treatment Patient Details Name: Alexa Rowe MRN: [id number] DOB: 04-27-1918 Today's Date: 07/02/2012 Time: 4098-1191 OT Time Calculation (min): 27 min  OT Assessment / Plan / Recommendation                   Plan Discharge plan remains appropriate    Precautions / Restrictions Precautions Precautions: Fall       ADL  Grooming: Performed;Minimal assistance;Wash/dry face;Wash/dry hands Where Assessed - Grooming: Supported standing Lower Body Bathing: Performed;Minimal assistance Where Assessed - Lower Body Bathing: Supported sit to Pharmacist, hospital: Performed;Minimal Dentist Method: Sit to Barista: Comfort height toilet Toileting - Clothing Manipulation and Hygiene: Minimal assistance Where Assessed - Toileting Clothing Manipulation and Hygiene: Standing Transfers/Ambulation Related to ADLs: Pt overall min A.Decreased memory noted regarding questions pt ask in regards to events of yesterday.      OT Goals ADL Goals ADL Goal: Lower Body Dressing - Progress: Progressing toward goals ADL Goal: Toilet Transfer - Progress: Progressing toward goals ADL Goal: Toileting - Clothing Manipulation - Progress: Progressing toward goals ADL Goal: Toileting - Hygiene - Progress: Progressing toward goals Miscellaneous OT Goals OT Goal: Miscellaneous Goal #1 - Progress: Progressing toward goals  Visit Information  Last OT Received On: 07/02/12          Cognition  Overall Cognitive Status: History of cognitive impairments - at baseline Area of Impairment: Memory Arousal/Alertness: Awake/alert Orientation Level: Appears intact for tasks assessed Behavior During Session: [facility] for tasks performed Memory Deficits: baseline memory deficits    Mobility  Shoulder Instructions Transfers Transfers: Sit to Stand;Stand to Sit Sit to Stand: 4: Min assist;From chair/3-in-1;From toilet Stand to Sit: 4: Min assist;To  toilet;To chair/3-in-1             End of Session OT - End of Session Equipment Utilized During Treatment: Gait belt Activity Tolerance: Patient tolerated treatment well Patient left: in chair;with call bell/phone within reach  GO     Jaxton Casale, [place] [facility] 07/02/2012, 10:02 AM

## 2012-07-02 NOTE — Progress Notes (Signed)
Called MD to let him know that neuro cleared the patient and she can go back to the SNF today

## 2012-07-02 NOTE — Progress Notes (Signed)
Report called to Berenda Morale, RN at [facility]. Questions answered, and patient transported via EMS back to facility.

## 2012-07-02 NOTE — Progress Notes (Signed)
Stroke Team Progress Note  HISTORY  Alexa Rowe is an 78 y.o. female who was in her USOH today until 5:45 PM, when she developed sudden onset of what the family describes as confusion. At her baseline, she is independent with normal speech. 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 Specific features of the apparent confusion included inability to follow verbal commands, paraphasias, and incomprehensible speech. The patient could at times verbalize short, understandable statements fluently, but not in relation to direct questions and apparently only as a response to internal stimuli, with no indication of a clear thought process. She was transported to [facility] and code stroke initiated. She is not on anticoagulants at home, per family.   Her PMHx includes HTN, dyslipidemia and remote history of ovarian and breast cancer. Her systolic blood pressure in the ED was as high as the low 200's and was brought below 185 with IV labetalol.   The patient was seen by Dr. Otelia Limes in the ED and felt to be a tPA candidate as she fell within the 3 hour time window.  [id number] Adminstration time tPA: 1945 (per my review of medication order sheet)  SUBJECTIVE  Patient sitting in chair. Wanting to go home.  OBJECTIVE Most recent Vital Signs: Filed Vitals:   07/01/12 0521 07/01/12 1449 07/01/12 2152 07/02/12 0247  BP: 122/74 102/60 142/68 153/80  Pulse: 89 91 71 84  Temp: 98.6 F (37 C) 97.9 F (36.6 C) 97.6 F (36.4 C) 97.9 F (36.6 C)  TempSrc: Oral Oral Oral Oral  Resp: 18 18 20 20   Height:      Weight:      SpO2: 98% 98% 96% 93%   CBG (last 3)  No results found for this basename: GLUCAP:3 in the last 72 hours Intake/Output from previous day:    IV Fluid Intake:     MEDICATIONS     . aspirin EC  81 mg Oral Daily  . atorvastatin  10 mg Oral q1800  . metoprolol succinate  25 mg Oral  Daily  . pantoprazole  40 mg Oral Q1200   PRN:  DISCONTD: sodium chloride, DISCONTD: labetalol  Diet:  General thin liquids Activity:  Ambulate DVT Prophylaxis:  SCD  CLINICALLY SIGNIFICANT STUDIES Basic Metabolic Panel:   Lab 07/02/12 0550 06/30/12 0445 06/29/12 0435  NA 137 136 --  K 3.8 3.4* --  CL 102 100 --  CO2 27 25 --  GLUCOSE 88 89 --  BUN 12 14 --  CREATININE 0.71 0.72 --  CALCIUM 9.6 9.6 --  MG -- -- 1.7  PHOS -- -- 3.5   Liver Function Tests:   Lab 06/28/12 1906  AST 19  ALT 12  ALKPHOS 61  BILITOT 0.3  PROT 6.9  ALBUMIN 4.0   CBC:   Lab 07/02/12 0550 06/30/12 0445 06/28/12 1906  WBC 6.8 6.0 --  NEUTROABS -- -- 4.5  HGB 12.1 12.1 --  HCT 35.4* 35.6* --  MCV 87.0 86.2 --  PLT 198 184 --   Coagulation:   Lab 06/28/12 1906  LABPROT 12.7  INR 0.96   Cardiac Enzymes:   Lab 06/28/12 1906  CKTOTAL --  CKMB --  CKMBINDEX --  TROPONINI <0.30   Urinalysis:   Lab 06/28/12 1958  COLORURINE YELLOW  LABSPEC 1.009  PHURINE 7.0  GLUCOSEU NEGATIVE  HGBUR TRACE*  BILIRUBINUR NEGATIVE  KETONESUR NEGATIVE  PROTEINUR NEGATIVE  UROBILINOGEN 0.2  NITRITE NEGATIVE  LEUKOCYTESUR NEGATIVE   HGB A1C  5.7  LDL   64  Ct Head Wo Contrast 06/29/2012  Atrophy and chronic small vessel change.  No acute finding.     Mr Maxine Glenn Head/Brain Wo Contrast 06/30/2012 No acute or reversible findings.  Atrophy and chronic small vessel disease. No major vessel occlusion or correctable proximal stenosis. Diffuse distal vessel atherosclerotic irregularity.     Carotid Doppler Mild plaque in the carotid bulb area and proximal ICA. No ICA stenosis   2D Echo: Left ventricle: The cavity size was normal. There was mild concentric hypertrophy. Systolic function was normal. The estimated ejection fraction was in the range of 55% to 60%. Wall motion was normal; there were no regional wall motion abnormalities. - Aortic valve: Mildly to moderately calcified annulus.   Mildly  calcified leaflets. Mild regurgitation. - Mitral valve: Calcified annulus. - Atrial septum: There was an atrial septal aneurysm.  CXR  No acute cardiopulmonary disease.  EKG  normal sinus rhythm.   Therapy Recommendations PT -; OT - ; ST - SNF  Physical Exam   Frail elderly lady not in distress.Awake alert. Afebrile. Head is nontraumatic. Neck is supple without bruit. Hearing is diminishedl. Cardiac exam no murmur or gallop. Lungs are clear to auscultation. Distal pulses are well felt.  Neurological Exam : Awake  Alert oriented x 1.diminished attention, registration. Diminished recall 0/3. Able to name only 5 animals. Can follow only simple midline and a few one-step commands. Normal speech and language.eye movements full without nystagmus. Face symmetric. Tongue midline. Normal strength, tone, reflexes and coordination. Normal sensation. Gait deferred.    ASSESSMENT Alexa Rowe is a 78 y.o. female presenting with acute onset receptive aphasia. Status post IV t-PA at 1945. Imaging confirms chronic small vessel disease.No definite infarct identified on imaging etiology probably acute delirium  Superimposed upon baseline mild chronic dementia with other underlying etiology.  Work up underway. On none prior to admission. Now on aspirin 81 mg orally every day for secondary stroke prevention. Patient with resultant acute delirium on chronic dementia.   Acute delirium on chronic dementia  Acute onset receptive aphasia  Hypertension  Hyperlipidemia  Remote history of ovarian and breast cancer  Hospital day # 4  TREATMENT/PLAN  Continue aspirin 81 mg orally every day for secondary stroke prevention.  Risk factor modification LDL<100, HGB A1C , 7.0, BP < 130/80  Plans are to transfer to SNF today Stroke Service will sign off. Follow up with Dr. Pearlean Brownie in 2 months    Guy Franco, [facility],  MBA, [facility] Pager: [phone] 07/02/2012 8:21 AM  Scribe for Dr.  Delia Heady, Stroke Center Medical Director. He has personally reviewed chart, pertinent data, examined the patient and developed the plan of care. Pager:  [phone]

## 2012-07-02 NOTE — Progress Notes (Signed)
Subjective: S/P Admission for CVA - S/P successful TPA. Currently no complaints sitting up eating in chair.  Looks great.  Confusion is less and she is very close to baseline. She l;ooks and feels great today   Objective: Vital signs in last 24 hours: Temp:  [97.6 F (36.4 C)-97.9 F (36.6 C)] 97.9 F (36.6 C) (10/08 0247) Pulse Rate:  [71-91] 84  (10/08 0247) Resp:  [18-20] 20  (10/08 0247) BP: (102-153)/(60-80) 153/80 mmHg (10/08 0247) SpO2:  [93 %-98 %] 93 % (10/08 0247) Weight change:  Last BM Date: 06/30/12  CBG (last 3)  No results found for this basename: GLUCAP:3 in the last 72 hours  Intake/Output from previous day: No intake or output data in the 24 hours ending 07/02/12 0733     Physical Exam  General appearance: appears younger than stated age. Eyes: no scleral icterus Throat: oropharynx moist without erythema.  Tongue midline. Resp: CTA Cardio: Reg.  No Bruits. GI: soft, non-tender; bowel sounds normal; no masses,  no organomegaly Extremities: no clubbing, cyanosis or edema.  No CVA residual.   Lab Results:  Basename 06/30/12 0445  NA 136  K 3.4*  CL 100  CO2 25  GLUCOSE 89  BUN 14  CREATININE 0.72  CALCIUM 9.6  MG --  PHOS --    No results found for this basename: AST:2,ALT:2,ALKPHOS:2,BILITOT:2,PROT:2,ALBUMIN:2 in the last 72 hours   Basename 07/02/12 0550 06/30/12 0445  WBC 6.8 6.0  NEUTROABS -- --  HGB 12.1 12.1  HCT 35.4* 35.6*  MCV 87.0 86.2  PLT 198 184    Lab Results  Component Value Date   INR 0.96 06/28/2012    No results found for this basename: CKTOTAL:3,CKMB:3,CKMBINDEX:3,TROPONINI:3 in the last 72 hours   Basename 06/30/12 0445  TSH 3.457  T4TOTAL --  T3FREE --  THYROIDAB --    No results found for this basename: VITAMINB12:2,FOLATE:2,FERRITIN:2,TIBC:2,IRON:2,RETICCTPCT:2 in the last 72 hours  Micro Results: Recent Results (from the past 240 hour(s))  MRSA PCR SCREENING     Status: Normal   Collection Time     06/29/12  8:00 AM      Component Value Range Status Comment   MRSA by PCR NEGATIVE  NEGATIVE Final      Studies/Results: Mr Shirlee Latch Wo Contrast  06/30/2012  *RADIOLOGY REPORT*  Clinical Data:  Stroke.  Mental status changes. Acute but ill- defined vascular disease.  MRI HEAD WITHOUT CONTRAST MRA HEAD WITHOUT CONTRAST  Technique:  Multiplanar, multiecho pulse sequences of the brain and surrounding structures were obtained without intravenous contrast. Angiographic images of the head were obtained using MRA technique without contrast.  Comparison:  Head CT 06/29/2012  MRI HEAD  Findings:  Diffusion imaging does not show any acute or subacute infarction.  There is generalized brain atrophy.  No focal insult of the brainstem or cerebellum.  The cerebral hemispheres show chronic small vessel changes within the deep and subcortical white matter.  There is hemosiderin deposition in the left temporal lobe related to an old small vessel infarction.  No mass lesion, acute hemorrhage, hydrocephalus or extra-axial collection.  No pituitary mass.  No inflammatory sinus disease.  IMPRESSION: No acute or reversible findings.  Atrophy and chronic small vessel disease.  MRA HEAD  Findings: Both internal carotid arteries are widely patent into the brain.  No siphon stenosis.  The anterior and middle cerebral vessels are patent without proximal stenosis, aneurysm or vascular malformation.  More distal branch vessels show atherosclerotic irregularity.  The  right vertebral artery is a small vessel that terminates in pica.  The left vertebral artery is the dominant vessel widely patent to the basilar.  No basilar stenosis.  Posterior circulation branch vessels are intact, with the left PCA receiving most of its supply from the anterior circulation.  More distal branch vessels show atherosclerotic irregularity.  IMPRESSION: No major vessel occlusion or correctable proximal stenosis. Diffuse distal vessel atherosclerotic  irregularity.   Original Report Authenticated By: Thomasenia Sales, Rowe.D.    Mr Brain Wo Contrast  06/30/2012  *RADIOLOGY REPORT*  Clinical Data:  Stroke.  Mental status changes. Acute but ill- defined vascular disease.  MRI HEAD WITHOUT CONTRAST MRA HEAD WITHOUT CONTRAST  Technique:  Multiplanar, multiecho pulse sequences of the brain and surrounding structures were obtained without intravenous contrast. Angiographic images of the head were obtained using MRA technique without contrast.  Comparison:  Head CT 06/29/2012  MRI HEAD  Findings:  Diffusion imaging does not show any acute or subacute infarction.  There is generalized brain atrophy.  No focal insult of the brainstem or cerebellum.  The cerebral hemispheres show chronic small vessel changes within the deep and subcortical white matter.  There is hemosiderin deposition in the left temporal lobe related to an old small vessel infarction.  No mass lesion, acute hemorrhage, hydrocephalus or extra-axial collection.  No pituitary mass.  No inflammatory sinus disease.  IMPRESSION: No acute or reversible findings.  Atrophy and chronic small vessel disease.  MRA HEAD  Findings: Both internal carotid arteries are widely patent into the brain.  No siphon stenosis.  The anterior and middle cerebral vessels are patent without proximal stenosis, aneurysm or vascular malformation.  More distal branch vessels show atherosclerotic irregularity.  The right vertebral artery is a small vessel that terminates in pica.  The left vertebral artery is the dominant vessel widely patent to the basilar.  No basilar stenosis.  Posterior circulation branch vessels are intact, with the left PCA receiving most of its supply from the anterior circulation.  More distal branch vessels show atherosclerotic irregularity.  IMPRESSION: No major vessel occlusion or correctable proximal stenosis. Diffuse distal vessel atherosclerotic irregularity.   Original Report Authenticated By: Thomasenia Sales, Rowe.D.      Medications: Scheduled:    . aspirin EC  81 mg Oral Daily  . atorvastatin  10 mg Oral q1800  . metoprolol succinate  25 mg Oral Daily  . pantoprazole  40 mg Oral Q1200  . DISCONTD: LORazepam  1 mg Intravenous Q8H  . DISCONTD: pantoprazole sodium  40 mg Oral QHS   Continuous:    Assessment/Plan: Active Problems:  Hypokalemia  CVA (cerebral infarction)   CVA - L Brain CVA S/P TPA.  No Further Aphasia.  She is near baseline.  Goal is BP control and anti-platelets.  She was on ASA on D/c from hospital end of July and I do not remember taking her off.  She will need ASA on D/C.  A1C only 5.7 and Normal = No DM2.  ECHO:- Left ventricle: The cavity size was normal. There was mild concentric hypertrophy. Systolic function was normal. The estimated ejection fraction was in the range of 55% to 60%. Wall motion was normal; there were no regional wall motion abnormalities. - Aortic valve: Mildly to moderately calcified annulus. Mildly calcified leaflets. Mild regurgitation. - Mitral valve: Calcified annulus. - Atrial septum: There was an atrial septal aneurysm.  MRI No acute or reversible findings. Atrophy and chronic small vessel disease. MRA:  No major vessel occlusion or correctable proximal stenosis.  Diffuse distal vessel atherosclerotic irregularity.  Carotid Doppler = Mild plaque in the carotid bulb area and proximal ICA. No ICA stenosis   Some agitation earlier in hospital and s/p Ativan which may have affected her mental state? She is starting to exhibit mild dementia at baseline and memory gets worse when acutely ill.  Per Nursing Dr Pearlean Brownie wanted EEG for today and pt probably can be D/ced after to [facility].  If they do not want I will D/C her.  HTN - S/P IV Labetolol.  Outpt Toprol/Verapamil. off HCTZ from summer when she had hyponatrmia..  I Restarted some home meds and on D/c will try to get pt on both meds and watch BP and HR.  No Diuretics  needed.  Lipids - Continue statin.  E Coli Pyelonephritis in July.  UA on 10/4 was (-)  GERD. PPI  Electrolyte issues.  Rechecked but CBC is only thing back thus far and normal..  CW/SW consult to help facilitate [facility].  DVT Prophylaxis - SCDs.  OVERACTIVE BLADDER - staying away from meds which may affect mind and memory.  OSTEOARTHRITIS (ICD-715.90) and RA.   On Celebrex - Discontinue as can increase BP response.  Has seen Rheum.    LOS: 4 days   Alexa Rowe 07/02/2012, 7:33 AM

## 2012-07-02 NOTE — Clinical Social Work Note (Signed)
Clinical Social Work  Pt is ready for discharge to [facility]. Facility has received discharge summary and is ready to accept pt. Facility is arranging transportation for pt. Pt and family are agreeable to discharge plan. CSW is signing off as no further needs identified.   Dede Query, MSW, Theresia Majors [phone]

## 2012-10-17 ENCOUNTER — Other Ambulatory Visit: Payer: Self-pay | Admitting: Rheumatology

## 2012-10-17 ENCOUNTER — Ambulatory Visit
Admission: RE | Admit: 2012-10-17 | Discharge: 2012-10-17 | Disposition: A | Discharge status: Home / Self Care | Payer: BC Managed Care – PPO | Source: Ambulatory Visit | Attending: Rheumatology | Admitting: Rheumatology

## 2012-10-17 DIAGNOSIS — R609 Edema, unspecified: Secondary | ICD-10-CM

## 2012-11-19 ENCOUNTER — Other Ambulatory Visit: Payer: Self-pay | Admitting: Rheumatology

## 2012-11-19 DIAGNOSIS — M79671 Pain in right foot: Secondary | ICD-10-CM

## 2012-11-19 DIAGNOSIS — R609 Edema, unspecified: Secondary | ICD-10-CM

## 2012-11-21 DIAGNOSIS — L97519 Non-pressure chronic ulcer of other part of right foot with unspecified severity: Secondary | ICD-10-CM | POA: Insufficient documentation

## 2012-11-26 ENCOUNTER — Ambulatory Visit
Admission: RE | Admit: 2012-11-26 | Discharge: 2012-11-26 | Disposition: A | Discharge status: Home / Self Care | Payer: Medicare Other | Source: Ambulatory Visit | Attending: Rheumatology | Admitting: Rheumatology

## 2012-11-26 ENCOUNTER — Inpatient Hospital Stay: Admission: RE | Admit: 2012-11-26 | Payer: Medicare Other | Source: Ambulatory Visit

## 2012-11-26 DIAGNOSIS — M79671 Pain in right foot: Secondary | ICD-10-CM

## 2012-11-26 DIAGNOSIS — R609 Edema, unspecified: Secondary | ICD-10-CM

## 2012-11-26 MED ORDER — GADOBENATE DIMEGLUMINE 529 MG/ML IV SOLN
10.0000 mL | Freq: Once | INTRAVENOUS | Status: AC | PRN
  Administered 2012-11-26: 10 mL via INTRAVENOUS

## 2012-11-27 ENCOUNTER — Encounter: Payer: Self-pay | Admitting: *Deleted

## 2012-11-27 DIAGNOSIS — L97519 Non-pressure chronic ulcer of other part of right foot with unspecified severity: Secondary | ICD-10-CM

## 2012-11-27 DIAGNOSIS — G459 Transient cerebral ischemic attack, unspecified: Secondary | ICD-10-CM | POA: Insufficient documentation

## 2012-11-28 ENCOUNTER — Encounter: Payer: Self-pay | Admitting: Internal Medicine

## 2012-11-28 ENCOUNTER — Ambulatory Visit (INDEPENDENT_AMBULATORY_CARE_PROVIDER_SITE_OTHER): Payer: Medicare Other | Admitting: Internal Medicine

## 2012-11-28 VITALS — BP 177/81 | HR 79 | Temp 97.6°F | Ht 64.0 in | Wt 125.0 lb

## 2012-11-28 DIAGNOSIS — M25571 Pain in right ankle and joints of right foot: Secondary | ICD-10-CM

## 2012-11-28 DIAGNOSIS — M25579 Pain in unspecified ankle and joints of unspecified foot: Secondary | ICD-10-CM

## 2012-11-28 NOTE — Progress Notes (Signed)
Patient ID: Alexa Rowe, female   DOB: 09-23-1918, 79 y.o.   MRN: [id number]         [facility]  Reason for Consult: Possible right great toe osteomyelitis Referring Physician: Dr. Vicki Mallet  Patient Active Problem List  Diagnosis  . Pyelonephritis, acute  . Unspecified essential hypertension  . Other and unspecified hyperlipidemia  . Dehydration  . Weakness generalized  . Malignant neoplasm of ovary  . Malignant neoplasm of breast (female), unspecified site  . Aortic valve disorders  . Mitral valve insufficiency and aortic valve insufficiency  . Osteoarthrosis, unspecified whether generalized or localized, unspecified site  . Disorder of bone and cartilage, unspecified  . Unspecified transient cerebral ischemia  . Rheumatoid arthritis  . Esophageal reflux  . Abnormality of gait  . Hypokalemia  . Hyponatremia  . CVA (cerebral infarction)  . Ulcer of toe of right foot  . TIA (transient ischemic attack)  . Pain in joint, ankle and foot    Patient's Medications  New Prescriptions   No medications on file  Previous Medications   ASPIRIN EC 81 MG EC TABLET    Take 1 tablet (81 mg total) by mouth daily.   CHOLECALCIFEROL (HM VITAMIN D3) 2000 UNITS CAPS    Take by mouth.   COD LIVER OIL PO    Take by mouth.   GABAPENTIN (NEURONTIN) 100 MG CAPSULE    Take 100 mg by mouth at bedtime.   METOPROLOL (LOPRESSOR) 50 MG TABLET    Take 50 mg by mouth 2 (two) times daily.   SENNA-DOCUSATE (SENOKOT-S) 8.6-50 MG PER TABLET    Take 1 tablet by mouth at bedtime.   SIMVASTATIN (ZOCOR) 40 MG TABLET    Take 40 mg by mouth daily.   VERAPAMIL (VERELAN PM) 180 MG 24 HR CAPSULE    Take 180 mg by mouth daily.  Modified Medications   No medications on file  Discontinued Medications   ATORVASTATIN (LIPITOR) 10 MG TABLET    Take 10 mg by mouth at bedtime.   CELECOXIB (CELEBREX) 200 MG CAPSULE    Take 200 mg by mouth daily.   HYDROCHLOROTHIAZIDE (HYDRODIURIL)  25 MG TABLET    Take 25 mg by mouth daily.   RANITIDINE (ZANTAC) 150 MG TABLET    Take 150 mg by mouth daily as needed. For upset stomach    Recommendations: 1. Observe off of antibiotics for now 2. Trial of gabapentin 3. Followup with Dr. Toni Arthurs next week and with me in 3 weeks   Assessment: I do not see any clear clinical evidence of infection in her right great toe. Her presenting complaint today is chronic, generalized foot pain which is unlikely to be do to osteomyelitis of her great toe. I discussed the situation with Dr. Victorino Dike and I feel that the best option at this point is to continue observation off of antibiotics to see if any clinical evidence of infection develops over the next few weeks. Empiric antibiotic therapy is certainly not without potential risks in her situation. I think it is also reasonable to have her start low-dose gabapentin to see if it offers any benefit for her chronic, generalized foot pain.   HPI: Alexa Rowe is a 79 y.o. female who has had chronic right foot pain dating back at least one year. She underwent bilateral foot surgery last August. She had bilateral second toe amputations and multiple flexor tendon sheath releases. Some of her pain in her right  foot improved with the second toe amputation but she was left with the same generalized right foot pain that has gotten gradually worse since that time. Within the last month or 2 she has developed an ulcer on the plantar surface of her right great toe. She has not had any fever, chills or sweats and she and her daughter-in-law, who is with her today, did not feel like her foot has been particularly swollen, red or warm.  She saw Dr. Victorino Dike on February 24. He described a 1 cm ulcer on the plantar surface of her right great toe without any evidence of infection. He had her start using a donut to offload pressure on the ulcer and suggested that she start gabapentin for possible neuropathic pain. Her  daughter-in-law feels like the ulcer has improved since she started using the donut.  She has never started the gabapentin. Instead she was placed on a trial of prednisone by Dr. Kellie Simmering for possible gout. She has not noticed any improvement in her pain and is now tapered off of the prednisone. She underwent an MRI of her foot 2 days ago which showed some diffuse soft tissue swelling around the great toe and some enhancement involving the distal great toe suspicious for osteomyelitis.  Review of Systems: Pertinent items are noted in HPI.    Past Medical History  Diagnosis Date  . Hypertension   . Arthritis   . TIA (transient ischemic attack)     History  Substance Use Topics  . Smoking status: Never Smoker   . Smokeless tobacco: Not on file  . Alcohol Use: No    No family history on file. No Known Allergies  OBJECTIVE: Blood pressure 177/81, pulse 79, temperature 97.6 F (36.4 C), temperature source Oral, height 5\' 4"  (1.626 m), weight 125 lb (56.7 kg). General: she is a pleasant, elderly female in no distress Skin: no rash Lungs: clear Cor: regular S1 and S2 with no murmurs Right foot: Her second toe is surgically absent. Her third toe deviates medially it overlaps her great toe. She has a bunion deformity of her great toe with callus formation under the metatarsal head. There is a small superficial ulcer without any drainage, odor or exposed bone. There is no swelling, redness or warmth of her foot. She has good pulses.  Microbiology: No results found for this or any previous visit (from the past 240 hour(s)).  Cliffton Asters, MD [facility] [phone] pager   [phone] cell 11/28/2012, 2:56 PM

## 2012-12-20 ENCOUNTER — Telehealth: Payer: Self-pay | Admitting: *Deleted

## 2012-12-20 NOTE — Telephone Encounter (Signed)
Patient's daughter in law called, reporting that the patient went to see Dr. Victorino Dike as recommended by Dr. Orvan Falconer.  Per the daughter-in-law, there is no evidence of infection and the patient is healing, feeling much better.  She is wondering if her appointment 12/24/12 is still necessary.  Please advise. Andree Coss, RN

## 2012-12-23 NOTE — Telephone Encounter (Signed)
Appointment cancelled, patient notified. Thank you.

## 2012-12-23 NOTE — Telephone Encounter (Signed)
If she is doing better I see no reason for her to keep that appointment.

## 2012-12-24 ENCOUNTER — Ambulatory Visit: Payer: Medicare Other | Admitting: Internal Medicine

## 2013-05-12 ENCOUNTER — Inpatient Hospital Stay (HOSPITAL_COMMUNITY): Payer: Medicare Other

## 2013-05-12 ENCOUNTER — Emergency Department (HOSPITAL_COMMUNITY): Payer: Medicare Other

## 2013-05-12 ENCOUNTER — Inpatient Hospital Stay (HOSPITAL_COMMUNITY)
Admission: EM | Admit: 2013-05-12 | Discharge: 2013-05-15 | DRG: 690 | Disposition: A | Discharge status: Home / Self Care | Payer: Medicare Other | Attending: Internal Medicine | Admitting: Internal Medicine

## 2013-05-12 ENCOUNTER — Encounter (HOSPITAL_COMMUNITY): Payer: Self-pay | Admitting: Emergency Medicine

## 2013-05-12 DIAGNOSIS — K219 Gastro-esophageal reflux disease without esophagitis: Secondary | ICD-10-CM | POA: Diagnosis present

## 2013-05-12 DIAGNOSIS — E871 Hypo-osmolality and hyponatremia: Secondary | ICD-10-CM | POA: Diagnosis present

## 2013-05-12 DIAGNOSIS — Z7982 Long term (current) use of aspirin: Secondary | ICD-10-CM

## 2013-05-12 DIAGNOSIS — M1990 Unspecified osteoarthritis, unspecified site: Secondary | ICD-10-CM | POA: Diagnosis present

## 2013-05-12 DIAGNOSIS — Z8249 Family history of ischemic heart disease and other diseases of the circulatory system: Secondary | ICD-10-CM

## 2013-05-12 DIAGNOSIS — I10 Essential (primary) hypertension: Secondary | ICD-10-CM | POA: Diagnosis present

## 2013-05-12 DIAGNOSIS — I639 Cerebral infarction, unspecified: Secondary | ICD-10-CM | POA: Diagnosis present

## 2013-05-12 DIAGNOSIS — A498 Other bacterial infections of unspecified site: Secondary | ICD-10-CM | POA: Diagnosis present

## 2013-05-12 DIAGNOSIS — N390 Urinary tract infection, site not specified: Principal | ICD-10-CM | POA: Diagnosis present

## 2013-05-12 DIAGNOSIS — F05 Delirium due to known physiological condition: Secondary | ICD-10-CM | POA: Diagnosis present

## 2013-05-12 DIAGNOSIS — S98139A Complete traumatic amputation of one unspecified lesser toe, initial encounter: Secondary | ICD-10-CM

## 2013-05-12 DIAGNOSIS — R531 Weakness: Secondary | ICD-10-CM | POA: Diagnosis present

## 2013-05-12 DIAGNOSIS — I69998 Other sequelae following unspecified cerebrovascular disease: Secondary | ICD-10-CM

## 2013-05-12 DIAGNOSIS — N318 Other neuromuscular dysfunction of bladder: Secondary | ICD-10-CM | POA: Diagnosis present

## 2013-05-12 DIAGNOSIS — M069 Rheumatoid arthritis, unspecified: Secondary | ICD-10-CM | POA: Diagnosis present

## 2013-05-12 DIAGNOSIS — R4182 Altered mental status, unspecified: Secondary | ICD-10-CM | POA: Diagnosis present

## 2013-05-12 DIAGNOSIS — F0390 Unspecified dementia without behavioral disturbance: Secondary | ICD-10-CM | POA: Diagnosis present

## 2013-05-12 HISTORY — DX: Urinary tract infection, site not specified: N39.0

## 2013-05-12 HISTORY — DX: Other symptoms and signs involving cognitive functions and awareness: R41.89

## 2013-05-12 LAB — COMPREHENSIVE METABOLIC PANEL
Albumin: 3.8 g/dL (ref 3.5–5.2)
Alkaline Phosphatase: 46 U/L (ref 39–117)
BUN: 15 mg/dL (ref 6–23)
Chloride: 94 mEq/L — ABNORMAL LOW (ref 96–112)
Creatinine, Ser: 0.69 mg/dL (ref 0.50–1.10)
GFR calc Af Amer: 84 mL/min — ABNORMAL LOW (ref >90)
Glucose, Bld: 115 mg/dL — ABNORMAL HIGH (ref 70–99)
Potassium: 3.9 mEq/L (ref 3.5–5.1)
Total Bilirubin: 0.5 mg/dL (ref 0.3–1.2)
Total Protein: 6.7 g/dL (ref 6.0–8.3)

## 2013-05-12 LAB — CBC WITH DIFFERENTIAL/PLATELET
Basophils Relative: 0 % (ref 0–1)
Eosinophils Absolute: 0 10*3/uL (ref 0.0–0.7)
HCT: 35.6 % — ABNORMAL LOW (ref 36.0–46.0)
Hemoglobin: 12.2 g/dL (ref 12.0–15.0)
Lymphs Abs: 1.1 10*3/uL (ref 0.7–4.0)
MCH: 30.1 pg (ref 26.0–34.0)
MCHC: 34.3 g/dL (ref 30.0–36.0)
Monocytes Absolute: 0.6 10*3/uL (ref 0.1–1.0)
Monocytes Relative: 7 % (ref 3–12)
RBC: 4.05 MIL/uL (ref 3.87–5.11)

## 2013-05-12 LAB — URINALYSIS, ROUTINE W REFLEX MICROSCOPIC
Glucose, UA: NEGATIVE mg/dL
Ketones, ur: NEGATIVE mg/dL
Nitrite: NEGATIVE
Protein, ur: 100 mg/dL — AB

## 2013-05-12 LAB — MAGNESIUM: Magnesium: 1.8 mg/dL (ref 1.5–2.5)

## 2013-05-12 LAB — TSH: TSH: 1.29 u[IU]/mL (ref 0.350–4.500)

## 2013-05-12 LAB — URINE MICROSCOPIC-ADD ON

## 2013-05-12 MED ORDER — ACETAMINOPHEN 325 MG PO TABS: 650.0000 mg | ORAL_TABLET | Freq: Four times a day (QID) | ORAL | Status: DC | PRN

## 2013-05-12 MED ORDER — SIMVASTATIN 40 MG PO TABS
40.0000 mg | ORAL_TABLET | Freq: Every day | ORAL | Status: DC
  Administered 2013-05-13 – 2013-05-14 (×2): 40 mg via ORAL
  Filled 2013-05-12 (×3): qty 1

## 2013-05-12 MED ORDER — SENNOSIDES-DOCUSATE SODIUM 8.6-50 MG PO TABS
1.0000 | ORAL_TABLET | Freq: Every day | ORAL | Status: DC
  Administered 2013-05-13 – 2013-05-14 (×2): 1 via ORAL
  Filled 2013-05-12 (×4): qty 1

## 2013-05-12 MED ORDER — GADOBENATE DIMEGLUMINE 529 MG/ML IV SOLN: 10.0000 mL | Freq: Once | INTRAVENOUS | Status: AC | PRN

## 2013-05-12 MED ORDER — DEXTROSE 5 % IV SOLN
1.0000 g | Freq: Once | INTRAVENOUS | Status: AC
  Administered 2013-05-12: 1 g via INTRAVENOUS
  Filled 2013-05-12: qty 10

## 2013-05-12 MED ORDER — DEXTROSE 5 % IV SOLN
1.0000 g | INTRAVENOUS | Status: DC
  Administered 2013-05-13 – 2013-05-14 (×2): 1 g via INTRAVENOUS
  Filled 2013-05-12 (×3): qty 10

## 2013-05-12 MED ORDER — ACETAMINOPHEN 650 MG RE SUPP: 650.0000 mg | Freq: Four times a day (QID) | RECTAL | Status: DC | PRN

## 2013-05-12 MED ORDER — SODIUM CHLORIDE 0.9 % IV SOLN
INTRAVENOUS | Status: DC
  Administered 2013-05-12: 21:00:00 via INTRAVENOUS

## 2013-05-12 MED ORDER — VERAPAMIL HCL ER 180 MG PO TBCR
180.0000 mg | EXTENDED_RELEASE_TABLET | Freq: Every day | ORAL | Status: DC
  Administered 2013-05-13 – 2013-05-15 (×3): 180 mg via ORAL
  Filled 2013-05-12 (×3): qty 1

## 2013-05-12 MED ORDER — METOPROLOL TARTRATE 50 MG PO TABS
50.0000 mg | ORAL_TABLET | Freq: Two times a day (BID) | ORAL | Status: DC
  Administered 2013-05-13 – 2013-05-15 (×5): 50 mg via ORAL
  Filled 2013-05-12 (×7): qty 1

## 2013-05-12 MED ORDER — ASPIRIN EC 81 MG PO TBEC
81.0000 mg | DELAYED_RELEASE_TABLET | Freq: Every day | ORAL | Status: DC
  Administered 2013-05-13 – 2013-05-15 (×3): 81 mg via ORAL
  Filled 2013-05-12 (×3): qty 1

## 2013-05-12 MED ORDER — VERAPAMIL HCL ER 180 MG PO CP24
180.0000 mg | ORAL_CAPSULE | Freq: Every day | ORAL | Status: DC
Start: 2013-05-12 — End: 2013-05-12

## 2013-05-12 NOTE — H&P (Signed)
Alexa Rowe is an 79 y.o. female.   PCP:   Gwen Pounds, MD   Chief Complaint:  confusion  HPI: 18 F with H/O CVA s/P TPA last time which saved her life presents today with AMS and difficulty with speech.  She is fluent right now and confused.  She does not recognize me and did not recognize some of her children.  She is pleasant and no aphasias at current time.  She was at her baseline last night.  She is fidgeting with her Tele monitor.  No CP/SOB/Ab issues.  Her boot/Shoes are in the room as she has chronic feet issues.  Seen in ED per Code Stroke and 80 YO female with increased confusion. Etiology is unclear at this time. Possibilities include underlying infections or metabolic derangements exacerbating previous stroke deficits versus acute stroke.  If this is an acute stroke, she is obviously out of the window for thrombolysis.  Recommend:  1) CT head and MRI brain to evaluated for bleed or infarct. At this time will not order stroke work-up. IF MRI is positive for stroke, stroke team will make further recommendations and orders.  2) agree with workup for underlying infection.  3) TSH, Calcium, magnesium  She ruled in for a UTI and Rxed with Rocephin  She unfortunately choked on liquid with a straw and will need a formal swallow study.     Past Medical History:  Past Medical History  Diagnosis Date  . Hypertension   . Arthritis   . TIA (transient ischemic attack)     Past Surgical History  Procedure Laterality Date  . Toe amputation Bilateral 05/26/12    bilateral second toe amputations      Allergies:  No Known Allergies   Medications: Prior to Admission medications   Medication Sig Start Date End Date Taking? Authorizing Provider  aspirin EC 81 MG EC tablet Take 1 tablet (81 mg total) by mouth daily. 07/02/12  Yes Gwen Pounds, MD  Cholecalciferol (HM VITAMIN D3) 2000 UNITS CAPS Take 1 capsule by mouth daily.    Yes Historical Provider, MD  COD LIVER OIL PO  Take 1 tablet by mouth daily.    Yes Historical Provider, MD  gabapentin (NEURONTIN) 100 MG capsule Take 100 mg by mouth at bedtime.   Yes Historical Provider, MD  metoprolol (LOPRESSOR) 50 MG tablet Take 50 mg by mouth 2 (two) times daily.   Yes Historical Provider, MD  senna-docusate (SENOKOT-S) 8.6-50 MG per tablet Take 1 tablet by mouth at bedtime.   Yes Historical Provider, MD  simvastatin (ZOCOR) 40 MG tablet Take 40 mg by mouth daily.   Yes Historical Provider, MD  verapamil (VERELAN PM) 180 MG 24 hr capsule Take 180 mg by mouth daily.   Yes Historical Provider, MD     Medications Prior to Admission  Medication Sig Dispense Refill  . aspirin EC 81 MG EC tablet Take 1 tablet (81 mg total) by mouth daily.      . Cholecalciferol (HM VITAMIN D3) 2000 UNITS CAPS Take 1 capsule by mouth daily.       . COD LIVER OIL PO Take 1 tablet by mouth daily.       Marland Kitchen gabapentin (NEURONTIN) 100 MG capsule Take 100 mg by mouth at bedtime.      . metoprolol (LOPRESSOR) 50 MG tablet Take 50 mg by mouth 2 (two) times daily.      Marland Kitchen senna-docusate (SENOKOT-S) 8.6-50 MG per tablet Take 1 tablet by  mouth at bedtime.      . simvastatin (ZOCOR) 40 MG tablet Take 40 mg by mouth daily.      . verapamil (VERELAN PM) 180 MG 24 hr capsule Take 180 mg by mouth daily.         Social History:  reports that she has never smoked. She does not have any smokeless tobacco history on file. She reports that she does not drink alcohol. Her drug history is not on file.  Family History: No family history on file.  Review of Systems:  Review of Systems - See above All other organ Sxs (-)  Physical Exam:  Blood pressure 148/66, pulse 66, temperature 97.2 F (36.2 C), temperature source Oral, resp. rate 21, SpO2 96.00%. Filed Vitals:   05/12/13 1415 05/12/13 1430 05/12/13 1515 05/12/13 1626  BP: 142/77 129/65 148/66   Pulse: 67 62 66   Temp:    97.2 F (36.2 C)  TempSrc:      Resp: 20 16 21    SpO2: 98% 98% 96%     General appearance: Pleasant. Confused.  Speech clear and fluent.  No Brocas or Wernickes. Head: Normocephalic, without obvious abnormality, atraumatic Eyes: conjunctivae/corneas clear. PERRL, EOM's intact.  Nose: Nares normal. Septum midline. Mucosa normal. No drainage or sinus tenderness. Throat: lips, mucosa, and tongue normal; teeth and gums normal Neck: no adenopathy, no carotid bruit, no JVD and thyroid not enlarged, symmetric, no tenderness/mass/nodules Resp: CTA Cardio: Reg GI: soft, non-tender; bowel sounds normal; no masses,  no organomegaly Extremities: extremities normal, atraumatic, no cyanosis or edema.  Nothing Focal. Pulses: 2+ and symmetric Lymph nodes: no cervical lymphadenopathy Neurologic: Alert and oriented X 3, normal strength and tone. Normal symmetric reflexes.     Labs on Admission:   Recent Labs  05/12/13 1135 05/12/13 1311  NA 130*  --   K 3.9  --   CL 94*  --   CO2 27  --   GLUCOSE 115*  --   BUN 15  --   CREATININE 0.69  --   CALCIUM 9.6  --   MG  --  1.8  PHOS  --  3.3    Recent Labs  05/12/13 1135  AST 18  ALT 11  ALKPHOS 46  BILITOT 0.5  PROT 6.7  ALBUMIN 3.8   No results found for this basename: LIPASE, AMYLASE,  in the last 72 hours  Recent Labs  05/12/13 1135  WBC 9.2  NEUTROABS 7.5  HGB 12.2  HCT 35.6*  MCV 87.9  PLT 203   No results found for this basename: CKTOTAL, CKMB, CKMBINDEX, TROPONINI,  in the last 72 hours Lab Results  Component Value Date   INR 0.96 06/28/2012     LAB RESULT POCT:  Results for orders placed during the hospital encounter of 05/12/13  CBC WITH DIFFERENTIAL      Result Value Range   WBC 9.2  4.0 - 10.5 K/uL   RBC 4.05  3.87 - 5.11 MIL/uL   Hemoglobin 12.2  12.0 - 15.0 g/dL   HCT 16.1 (*) 09.6 - 04.5 %   MCV 87.9  78.0 - 100.0 fL   MCH 30.1  26.0 - 34.0 pg   MCHC 34.3  30.0 - 36.0 g/dL   RDW 40.9  81.1 - 91.4 %   Platelets 203  150 - 400 K/uL   Neutrophils Relative % 82 (*) 43 -  77 %   Neutro Abs 7.5  1.7 - 7.7 K/uL  Lymphocytes Relative 11 (*) 12 - 46 %   Lymphs Abs 1.1  0.7 - 4.0 K/uL   Monocytes Relative 7  3 - 12 %   Monocytes Absolute 0.6  0.1 - 1.0 K/uL   Eosinophils Relative 0  0 - 5 %   Eosinophils Absolute 0.0  0.0 - 0.7 K/uL   Basophils Relative 0  0 - 1 %   Basophils Absolute 0.0  0.0 - 0.1 K/uL  COMPREHENSIVE METABOLIC PANEL      Result Value Range   Sodium 130 (*) 135 - 145 mEq/L   Potassium 3.9  3.5 - 5.1 mEq/L   Chloride 94 (*) 96 - 112 mEq/L   CO2 27  19 - 32 mEq/L   Glucose, Bld 115 (*) 70 - 99 mg/dL   BUN 15  6 - 23 mg/dL   Creatinine, Ser 1.61  0.50 - 1.10 mg/dL   Calcium 9.6  8.4 - 09.6 mg/dL   Total Protein 6.7  6.0 - 8.3 g/dL   Albumin 3.8  3.5 - 5.2 g/dL   AST 18  0 - 37 U/L   ALT 11  0 - 35 U/L   Alkaline Phosphatase 46  39 - 117 U/L   Total Bilirubin 0.5  0.3 - 1.2 mg/dL   GFR calc non Af Amer 73 (*) >90 mL/min   GFR calc Af Amer 84 (*) >90 mL/min  URINALYSIS, ROUTINE W REFLEX MICROSCOPIC      Result Value Range   Color, Urine YELLOW  YELLOW   APPearance CLOUDY (*) CLEAR   Specific Gravity, Urine 1.018  1.005 - 1.030   pH 7.0  5.0 - 8.0   Glucose, UA NEGATIVE  NEGATIVE mg/dL   Hgb urine dipstick MODERATE (*) NEGATIVE   Bilirubin Urine NEGATIVE  NEGATIVE   Ketones, ur NEGATIVE  NEGATIVE mg/dL   Protein, ur 045 (*) NEGATIVE mg/dL   Urobilinogen, UA 0.2  0.0 - 1.0 mg/dL   Nitrite NEGATIVE  NEGATIVE   Leukocytes, UA MODERATE (*) NEGATIVE  AMMONIA      Result Value Range   Ammonia 15  11 - 60 umol/L  PHOSPHORUS      Result Value Range   Phosphorus 3.3  2.3 - 4.6 mg/dL  MAGNESIUM      Result Value Range   Magnesium 1.8  1.5 - 2.5 mg/dL  URINE MICROSCOPIC-ADD ON      Result Value Range   WBC, UA TOO NUMEROUS TO COUNT  <3 WBC/hpf   RBC / HPF 11-20  <3 RBC/hpf   Bacteria, UA MANY (*) RARE      Radiological Exams on Admission: Dg Chest 2 View  05/12/2013   *RADIOLOGY REPORT*  Clinical Data: Altered mental status.   CHEST - 2 VIEW  Comparison: 06/29/2012.  Findings: Trachea is midline.  Heart size normal.  There is added rounded density in the right infrahilar region, new from the prior exam.  Biapical pleural parenchymal scarring.  Lungs are hyperinflated with possible minimal atelectasis at the left costophrenic angle.  No definite pleural fluid.  IMPRESSION: Nodular added density in the right infrahilar region, new from 06/29/2012.  Non emergent CT chest without contrast is recommended, as clinically indicated.   Original Report Authenticated By: Leanna Battles, M.D.   Ct Head Wo Contrast  05/12/2013   *RADIOLOGY REPORT*  Clinical Data: Acute onset of confusion.  CT HEAD WITHOUT CONTRAST  Technique:  Contiguous axial images were obtained from the base of the skull through  the vertex without contrast.  Comparison: Head CT 06/29/2012 and brain MR 06/30/2012.  Findings: Atrophy and chronic microvascular change are again seen. No evidence of acute intracranial abnormality including infarct, hemorrhage, mass lesion, mass effect, midline shift or abnormal extra axial fluid collection is identified.  IMPRESSION: No acute finding. Stable compared to prior exam.   Original Report Authenticated By: Holley Dexter, M.D.      Orders placed during the hospital encounter of 05/12/13  . ED EKG  . ED EKG     Assessment/Plan Principal Problem:   Acute confusional state Active Problems:   Unspecified essential hypertension   Weakness generalized   Rheumatoid arthritis(714.0)   Esophageal reflux   Hyponatremia   CVA (cerebral infarction)   UTI (urinary tract infection)  AMS/Confusion - Hopefully more metabolic and due to the UTI - Rx c IVF and IV Rocephin. Await U Cx.  H/O CVA s/P TPA 06/2012 with aggressive W/up at that time. F/Up on MRI. CCT was (-)  She unfortunately choked on liquid with a straw and will need a formal swallow study.  Hyponatremia - IVF c NS and follow.  HTN - BP fine. Adjust  prn Lipids - Continue statin.  E Coli Pyelonephritis in July 2013.  GERD. PPI  CW/SW/PT/OT/ST consulted to help facilitate care.  She usually needs [facility] after hospital Discharge DVT Prophylaxis - SCDs provided  OVERACTIVE BLADDER - staying away from meds which may affect mind and memory.  OSTEOARTHRITIS (ICD-715.90) and RA.    Madline Oesterling M 05/12/2013, 5:40 PM

## 2013-05-12 NOTE — ED Notes (Signed)
Pt presents with acute onset of confusion, last known normal was yesterday (1300) per son who talked to her on the phone.  Husband called pt today and found her to be increasingly confused and noted her to have mild slurred speech.  Pt presents alert and oriented to self, per family pt normally a&o x4.  Pt follows simple commands but is unable to answer anything beyond very basic questions.  Pt has word salad, such as when reading the sentence, "I got home from work," she would say "I got home from work..twerk."

## 2013-05-12 NOTE — ED Provider Notes (Signed)
CSN: [id number]     Arrival date & time 05/12/13  1051 History     First MD Initiated Contact with Patient 05/12/13 1057     Chief Complaint  Patient presents with  . Altered Mental Status   level V caveat due to altered mental status. (Consider location/radiation/quality/duration/timing/severity/associated sxs/prior Treatment) The history is provided by the patient and a relative.   patient presents with altered mental status and difficulty speaking. Patient reportedly woke up with it this morning. She has a previous history of stroke for similar symptoms that required TPA. Last normal was last night. She is not able to identify her sons in the room. She is able to follow commands.  Past Medical History  Diagnosis Date  . Hypertension   . Arthritis   . TIA (transient ischemic attack)    Past Surgical History  Procedure Laterality Date  . Toe amputation Bilateral 05/26/12    bilateral second toe amputations   No family history on file. History  Substance Use Topics  . Smoking status: Never Smoker   . Smokeless tobacco: Not on file  . Alcohol Use: No   OB History   Grav Para Term Preterm Abortions TAB SAB Ect Mult Living                 Review of Systems  Unable to perform ROS: Mental status change    Allergies  Review of patient's allergies indicates no known allergies.  Home Medications   No current outpatient prescriptions on file. BP 148/66  Pulse 66  Temp(Src) 97.5 F (36.4 C) (Oral)  Resp 21  SpO2 96% Physical Exam  Nursing note and vitals reviewed. Constitutional: She appears well-developed and well-nourished.  HENT:  Head: Normocephalic and atraumatic.  Eyes: EOM are normal. Pupils are equal, round, and reactive to light.  Neck: Normal range of motion. Neck supple.  Cardiovascular: Normal rate, regular rhythm and normal heart sounds.   No murmur heard. Pulmonary/Chest: Effort normal and breath sounds normal. No respiratory distress. She has no  wheezes. She has no rales.  Abdominal: Soft. Bowel sounds are normal. She exhibits no distension. There is no tenderness. There is no rebound and no guarding.  Musculoskeletal: Normal range of motion.  Neurological: She is alert. No cranial nerve deficit.  Patient will move all extremities. Her face is symmetric. She's able follow commands to squeeze hands, however she is not able to identify her sons. She is somewhat slow to answer questions. She was unable to identify a pen held in front of her.  Skin: Skin is warm and dry.  Psychiatric: She has a normal mood and affect. Her speech is normal.    ED Course   Procedures (including critical care time)  Labs Reviewed  CBC WITH DIFFERENTIAL - Abnormal; Notable for the following:    HCT 35.6 (*)    Neutrophils Relative % 82 (*)    Lymphocytes Relative 11 (*)    All other components within normal limits  COMPREHENSIVE METABOLIC PANEL - Abnormal; Notable for the following:    Sodium 130 (*)    Chloride 94 (*)    Glucose, Bld 115 (*)    GFR calc non Af Amer 73 (*)    GFR calc Af Amer 84 (*)    All other components within normal limits  URINALYSIS, ROUTINE W REFLEX MICROSCOPIC - Abnormal; Notable for the following:    APPearance CLOUDY (*)    Hgb urine dipstick MODERATE (*)    Protein, ur  100 (*)    Leukocytes, UA MODERATE (*)    All other components within normal limits  URINE MICROSCOPIC-ADD ON - Abnormal; Notable for the following:    Bacteria, UA MANY (*)    All other components within normal limits  URINE CULTURE  AMMONIA  PHOSPHORUS  MAGNESIUM  TSH   Dg Chest 2 View  05/12/2013   *RADIOLOGY REPORT*  Clinical Data: Altered mental status.  CHEST - 2 VIEW  Comparison: 06/29/2012.  Findings: Trachea is midline.  Heart size normal.  There is added rounded density in the right infrahilar region, new from the prior exam.  Biapical pleural parenchymal scarring.  Lungs are hyperinflated with possible minimal atelectasis at the left  costophrenic angle.  No definite pleural fluid.  IMPRESSION: Nodular added density in the right infrahilar region, new from 06/29/2012.  Non emergent CT chest without contrast is recommended, as clinically indicated.   Original Report Authenticated By: Leanna Battles, M.D.   Ct Head Wo Contrast  05/12/2013   *RADIOLOGY REPORT*  Clinical Data: Acute onset of confusion.  CT HEAD WITHOUT CONTRAST  Technique:  Contiguous axial images were obtained from the base of the skull through the vertex without contrast.  Comparison: Head CT 06/29/2012 and brain MR 06/30/2012.  Findings: Atrophy and chronic microvascular change are again seen. No evidence of acute intracranial abnormality including infarct, hemorrhage, mass lesion, mass effect, midline shift or abnormal extra axial fluid collection is identified.  IMPRESSION: No acute finding. Stable compared to prior exam.   Original Report Authenticated By: Holley Dexter, M.D.   1. UTI (urinary tract infection)   2. Altered mental status     Date: 05/12/2013  Rate: 61  Rhythm: normal sinus rhythm  QRS Axis: normal  Intervals: normal  ST/T Wave abnormalities: normal  Conduction Disutrbances:none  Narrative Interpretation:   Old EKG Reviewed: unchanged   MDM  Patient with altered mental status and some speech difficulty. Had previous strokes that required TPA with speech findings. Does have a urinary tract infection which could be the cause. CT is reassuring. Seen by neurology and will be admitted to internal medicine.  Juliet Rude. Rubin Payor, MD 05/12/13 [phone]

## 2013-05-12 NOTE — ED Notes (Signed)
Per EMs - pt coming from [facility], lives in independent side. Pt went to bed around 10pm, no issues last night. Woke up this morning with confusion, calling things different names. No difficulty speaking, no facial droop. Equal hand grips. Pt has hx of stroke in past with similar symptoms. CBG 133 BP 140/76 HR 66  NSR.

## 2013-05-12 NOTE — Consult Note (Signed)
NEURO HOSPITALIST CONSULT NOTE    Reason for Consult: AMS  HPI:                                                                                                                                          Alexa Rowe is an 79 y.o. female who recently was seen in the hospital in October 1013 for confusion. At that time patient received tPA for what was thought to be a possible stroke causing aphasia. Follow up MRI reveal no permanent infarct.  Patient was last seen normal last night. This am family noted patient was having difficulty following commands and appeared to be more confused than normal. ( At baseline she lives with her husband, family states she has moderated cognitive decline and recently took away her driving privileges due to cognitive decline and decreased response time. )  She was brought to the ED for further evaluation. Currently her speech is clear, she is moving all 4 extremities with 4/5 strength but does show some difficulty following commands and understanding what I would like her to do.   Past Medical History  Diagnosis Date  . Hypertension   . Arthritis   . TIA (transient ischemic attack)     Past Surgical History  Procedure Laterality Date  . Toe amputation Bilateral 05/26/12    bilateral second toe amputations    Family History: Mother HTN Father HTN  Social History:  reports that she has never smoked. She does not have any smokeless tobacco history on file. She reports that she does not drink alcohol. Her drug history is not on file.  No Known Allergies  MEDICATIONS:                                                                                                                     No current facility-administered medications for this encounter.   Current Outpatient Prescriptions  Medication Sig Dispense Refill  . aspirin EC 81 MG EC tablet Take 1 tablet (81 mg total) by mouth daily.      . Cholecalciferol (HM VITAMIN D3) 2000 UNITS  CAPS Take 1 capsule by mouth daily.       . COD LIVER OIL PO Take 1 tablet by mouth daily.       Marland Kitchen  gabapentin (NEURONTIN) 100 MG capsule Take 100 mg by mouth at bedtime.      . metoprolol (LOPRESSOR) 50 MG tablet Take 50 mg by mouth 2 (two) times daily.      Marland Kitchen senna-docusate (SENOKOT-S) 8.6-50 MG per tablet Take 1 tablet by mouth at bedtime.      . simvastatin (ZOCOR) 40 MG tablet Take 40 mg by mouth daily.      . verapamil (VERELAN PM) 180 MG 24 hr capsule Take 180 mg by mouth daily.          ROS:                                                                                                                                       History obtained from the patient and sons  General ROS: negative for - chills, fatigue, fever, night sweats, weight gain or weight loss Psychological ROS: negative for - behavioral disorder, hallucinations, memory difficulties, mood swings or suicidal ideation Ophthalmic ROS: negative for - blurry vision, double vision, eye pain or loss of vision ENT ROS: negative for - epistaxis, nasal discharge, oral lesions, sore throat, tinnitus or vertigo Allergy and Immunology ROS: negative for - hives or itchy/watery eyes Hematological and Lymphatic ROS: negative for - bleeding problems, bruising or swollen lymph nodes Endocrine ROS: negative for - galactorrhea, hair pattern changes, polydipsia/polyuria or temperature intolerance Respiratory ROS: negative for - cough, hemoptysis, shortness of breath or wheezing Cardiovascular ROS: negative for - chest pain, dyspnea on exertion, edema or irregular heartbeat Gastrointestinal ROS: negative for - abdominal pain, diarrhea, hematemesis, nausea/vomiting or stool incontinence Genito-Urinary ROS: negative for - dysuria, hematuria, incontinence or urinary frequency/urgency Musculoskeletal ROS: negative for - joint swelling or muscular weakness Neurological ROS: as noted in HPI Dermatological ROS: negative for rash and skin lesion  changes   Blood pressure 149/74, pulse 62, temperature 97.5 F (36.4 C), temperature source Oral, resp. rate 13, SpO2 98.00%.   Neurologic Examination:                                                                                                      Mental Status: Alert, not oriented to place and at times having a hard time recognizing her sons at bedside.  Speech fluent without evidence of aphasia but does show difficulty following verbal commands.  Cranial Nerves: II: Discs flat bilaterally; Visual fields grossly normal, pupils equal, round, reactive to light and accommodation III,IV, VI: ptosis not present, extra-ocular motions intact bilaterally  V,VII: smile symmetric, facial light touch sensation normal bilaterally VIII: hearing normal bilaterally IX,X: gag reflex present XI: bilateral shoulder shrug XII: midline tongue extension Motor: Right : Upper extremity   5/5    Left:     Upper extremity   5/5  Lower extremity   5/5     Lower extremity   5/5 Tone and bulk:normal tone throughout; no atrophy noted Sensory: Pinprick and light touch intact throughout, bilaterally Deep Tendon Reflexes:  Right: Upper Extremity   Left: Upper extremity   biceps (C-5 to C-6) 2/4   biceps (C-5 to C-6) 2/4 tricep (C7) 2/4    triceps (C7) 2/4 Brachioradialis (C6) 2/4  Brachioradialis (C6) 2/4  Lower Extremity Lower Extremity  quadriceps (L-2 to L-4) 2/4   quadriceps (L-2 to L-4) 2/4 Achilles (S1) 2/4   Achilles (S1) 2/4  Plantars: Right: downgoing   Left: downgoing Cerebellar: normal finger-to-nose,  CV: pulses palpable throughout    Lab Results  Component Value Date/Time   CHOL 141 07/02/2012  5:50 AM    Results for orders placed during the hospital encounter of 05/12/13 (from the past 48 hour(s))  CBC WITH DIFFERENTIAL     Status: Abnormal   Collection Time    05/12/13 11:35 AM      Result Value Range   WBC 9.2  4.0 - 10.5 K/uL   RBC 4.05  3.87 - 5.11 MIL/uL   Hemoglobin 12.2   12.0 - 15.0 g/dL   HCT 08.6 (*) 57.8 - 46.9 %   MCV 87.9  78.0 - 100.0 fL   MCH 30.1  26.0 - 34.0 pg   MCHC 34.3  30.0 - 36.0 g/dL   RDW 62.9  52.8 - 41.3 %   Platelets 203  150 - 400 K/uL   Neutrophils Relative % 82 (*) 43 - 77 %   Neutro Abs 7.5  1.7 - 7.7 K/uL   Lymphocytes Relative 11 (*) 12 - 46 %   Lymphs Abs 1.1  0.7 - 4.0 K/uL   Monocytes Relative 7  3 - 12 %   Monocytes Absolute 0.6  0.1 - 1.0 K/uL   Eosinophils Relative 0  0 - 5 %   Eosinophils Absolute 0.0  0.0 - 0.7 K/uL   Basophils Relative 0  0 - 1 %   Basophils Absolute 0.0  0.0 - 0.1 K/uL    No results found.   Assessment/Plan: 79 YO female with increased confusion.  Etiology is unclear at this time.  Possibilities include underlying infections or metabolic derangements exacerbating previous stroke deficits versus acute stroke.  If this is an acute stroke, she is obviously out of the window for thrombolysis.   Recommend: 1) CT head and MRI brain to evaluated for bleed or infarct.  At this time will not order stroke work-up. IF MRI is positive for stroke, stroke team will make further recommendations and orders.  2) agree with workup for underlying infection.  3) TSH, Calcium, magnesium  Assessment and plan discussed with with attending physician and they are in agreement.    Felicie Morn PA-C [facility] [phone]  05/12/2013, 12:24 PM  Patient seen and examined together with physician assistant and I concur with the assessment and plan.  Wyatt Portela, MD

## 2013-05-12 NOTE — ED Notes (Signed)
Did in and out cath on patient cloudy urine in return

## 2013-05-13 DIAGNOSIS — N390 Urinary tract infection, site not specified: Principal | ICD-10-CM

## 2013-05-13 LAB — BASIC METABOLIC PANEL
BUN: 12 mg/dL (ref 6–23)
CO2: 26 mEq/L (ref 19–32)
Calcium: 9.7 mg/dL (ref 8.4–10.5)
Creatinine, Ser: 0.75 mg/dL (ref 0.50–1.10)
GFR calc non Af Amer: 71 mL/min — ABNORMAL LOW (ref >90)
Glucose, Bld: 86 mg/dL (ref 70–99)

## 2013-05-13 LAB — URINE CULTURE

## 2013-05-13 LAB — CBC
MCH: 30.8 pg (ref 26.0–34.0)
MCHC: 34.9 g/dL (ref 30.0–36.0)
MCV: 88.3 fL (ref 78.0–100.0)
Platelets: 185 10*3/uL (ref 150–400)
RBC: 4.12 MIL/uL (ref 3.87–5.11)
RDW: 12.9 % (ref 11.5–15.5)

## 2013-05-13 MED ORDER — HYDRALAZINE HCL 25 MG PO TABS
25.0000 mg | ORAL_TABLET | Freq: Two times a day (BID) | ORAL | Status: DC
  Administered 2013-05-13 – 2013-05-15 (×5): 25 mg via ORAL
  Filled 2013-05-13 (×6): qty 1

## 2013-05-13 NOTE — Progress Notes (Signed)
NEURO HOSPITALIST PROGRESS NOTE   SUBJECTIVE:                                                                                                                        No new complaints. Forgot she had seen and spoken to me the day before. Stated she feels fine.   OBJECTIVE:                                                                                                                           Vital signs in last 24 hours: Temp:  [97.2 F (36.2 C)-98.1 F (36.7 C)] 98 F (36.7 C) (08/19 0720) Pulse Rate:  [59-108] 69 (08/19 0720) Resp:  [13-21] 20 (08/19 0720) BP: (129-180)/(54-88) 150/71 mmHg (08/19 0720) SpO2:  [95 %-99 %] 98 % (08/19 0720) Weight:  [50.848 kg (112 lb 1.6 oz)-53.887 kg (118 lb 12.8 oz)] 50.848 kg (112 lb 1.6 oz) (08/19 0400)  Intake/Output from previous day:   Intake/Output this shift:   Nutritional status: NPO  Past Medical History  Diagnosis Date  . Hypertension   . Arthritis   . TIA (transient ischemic attack)     Neurologic Exam:  Mental Status:  Alert, not oriented to place, forgot she had spoken to me the day before.  Able to follow some commands but still has some difficulty with multiple step commands.    Cranial Nerves:  II: Visual fields grossly normal, pupils equal, round, reactive to light and accommodation  III,IV, VI: ptosis not present, extra-ocular motions intact bilaterally  V,VII: smile symmetric, facial light touch sensation normal bilaterally  VIII: hearing normal bilaterally  IX,X: gag reflex present  XI: bilateral shoulder shrug  XII: midline tongue extension  Motor:  Moving all extremities 5/5 Sensory: Pinprick and light touch intact throughout, bilaterally  Deep Tendon Reflexes:  2+ throughout  Except at AJ 1+ Plantars:  Right: downgoing      Left: downgoing  Cerebellar:  normal finger-to-nose,  CV: pulses palpable throughout    Lab Results: Lab Results  Component Value Date/Time    CHOL 141 07/02/2012  5:50 AM   Lipid Panel No results found for this basename: CHOL, TRIG, HDL, CHOLHDL, VLDL, LDLCALC,  in the last 72 hours  Studies/Results: Dg Chest 2 View  05/12/2013   *RADIOLOGY REPORT*  Clinical Data: Altered mental status.  CHEST - 2 VIEW  Comparison: 06/29/2012.  Findings: Trachea is midline.  Heart size normal.  There is added rounded density in the right infrahilar region, new from the prior exam.  Biapical pleural parenchymal scarring.  Lungs are hyperinflated with possible minimal atelectasis at the left costophrenic angle.  No definite pleural fluid.  IMPRESSION: Nodular added density in the right infrahilar region, new from 06/29/2012.  Non emergent CT chest without contrast is recommended, as clinically indicated.   Original Report Authenticated By: Leanna Battles, M.D.   Ct Head Wo Contrast  05/12/2013   *RADIOLOGY REPORT*  Clinical Data: Acute onset of confusion.  CT HEAD WITHOUT CONTRAST  Technique:  Contiguous axial images were obtained from the base of the skull through the vertex without contrast.  Comparison: Head CT 06/29/2012 and brain MR 06/30/2012.  Findings: Atrophy and chronic microvascular change are again seen. No evidence of acute intracranial abnormality including infarct, hemorrhage, mass lesion, mass effect, midline shift or abnormal extra axial fluid collection is identified.  IMPRESSION: No acute finding. Stable compared to prior exam.   Original Report Authenticated By: Holley Dexter, M.D.   Mr Brain Wo Contrast  05/12/2013   *RADIOLOGY REPORT*  Clinical Data: Increasing confusion.  Difficulty following commands.  MRI HEAD WITHOUT CONTRAST  Technique:  Multiplanar, multiecho pulse sequences of the brain and surrounding structures were obtained according to standard protocol without intravenous contrast.  Comparison: 05/12/2013 CT.  MR head 06/30/2012.  Findings: There is no evidence for acute infarction, intracranial hemorrhage, mass lesion,  hydrocephalus, or extra-axial fluid. There is advanced atrophy and extensive small vessel disease.  Flow voids are maintained.  Calvarium intact.  Bilateral cataract extraction.  No midline abnormality.  No acute sinus or mastoid disease. No change from prior CT or MR.  IMPRESSION: Chronic changes as described.  No acute intracranial findings.   Original Report Authenticated By: Davonna Belling, M.D.    MEDICATIONS                                                                                                                        Scheduled: . aspirin EC  81 mg Oral Daily  . cefTRIAXone (ROCEPHIN)  IV  1 g Intravenous Q24H  . hydrALAZINE  25 mg Oral Q12H  . metoprolol  50 mg Oral BID  . senna-docusate  1 tablet Oral QHS  . simvastatin  40 mg Oral q1800  . verapamil  180 mg Oral Daily    ASSESSMENT/PLAN:  79 YO female with increased confusion in the setting of UTI.  MRI brain showed no acute infarct. Symptoms of confusion and word finding difficulty likely a combination of infectious process and exacerbation of underlying deficits.  No further neurological diagnostic testing recommended at this time.  Agree with treatment of infection.  Neurology will S/O  Assessment and plan discussed with with attending physician and they are in agreement.    Alexa Morn PA-C [facility] [phone]  05/13/2013, 9:29 AM

## 2013-05-13 NOTE — Progress Notes (Signed)
Clinical Social Work Department BRIEF PSYCHOSOCIAL ASSESSMENT 05/13/2013  Patient:  Alexa Rowe, Alexa Rowe     Account Number:  [id number]     Admit date:  05/12/2013  Clinical Social Worker:  Harless Nakayama  Date/Time:  05/13/2013 12:00 M  Referred by:  Physician  Date Referred:  05/13/2013 Referred for  SNF Placement   Other Referral:   Interview type:  Family Other interview type:   CSW spoke with pt son and daughter over the phone.    PSYCHOSOCIAL DATA Living Status:  FACILITY Admitted from facility:  [facility] Level of care:  Independent Living Primary support name:  Alexa Rowe [phone] Primary support relationship to patient:  SPOUSE Degree of support available:   Pt has good family support. Pt sons, Alexa Rowe [phone]) and Alexa Rowe [phone]), and daughter, Alexa Rowe [phone]), are all very involved in pt plan of care.    CURRENT CONCERNS Current Concerns  Post-Acute Placement   Other Concerns:    SOCIAL WORK ASSESSMENT / PLAN CSW received referral that pt may require ST rehab at dc. Pt currently resides with her husband at [facility] independent living. CSW spoke with pt children about recommendations for dc. Pt children are requesting short term rehab at [facility]. CSW contacted facility to inform. Facility confirmed that pt can dc to skilled area for rehab but there are currently no beds available and unsure of bed openings for the week. CSW spoke to pt family about possible back up if no bed is available at [facility] at dc. Family to contact CSW back after speaking with other family members and considering options.    Pt information sent through carefinder pro to facility.   Assessment/plan status:  Psychosocial Support/Ongoing Assessment of Needs Other assessment/ plan:   Information/referral to community resources:   None needed at this time. Will provide SNF list if family chooses to pursue other SNFs.    PATIENT'S/FAMILY'S RESPONSE TO PLAN OF CARE: Pt  family agreeable to plan of care.       Cabela Pacifico, LCSWA [phone]

## 2013-05-13 NOTE — Evaluation (Signed)
Occupational Therapy Evaluation Patient Details Name: Alexa Rowe MRN: [id number] DOB: Feb 14, 1918 Today's Date: 05/13/2013 Time: 4098-1191 OT Time Calculation (min): 38 min  OT Assessment / Plan / Recommendation History of present illness 79 yo female adm to [facility] with confusion, received work up for possible neuro event. Pt found to have UTI, CT head, brain MRI negative. Pt with h/o RA, essential HTN, cognitive deficits (family recently took away her license to drive), bil 2nd toe amputations, and per pt she has torn ligaments in her Rt foot. She has an AFO for RLE and wears bil post-op shoes   Clinical Impression   Pt at set up/sup - min guard A level with ADLs. Pt requires cues for safe use or RW. No further acute OT services indicated at this time and pt to continue with acute PT services to address balance and functional mobility safety    OT Assessment  All further OT needs can be met in the next venue of care    Follow Up Recommendations  Home health OT;Supervision/Assistance - 24 hour    Barriers to Discharge      Equipment Recommendations  None recommended by OT    Recommendations for Other Services    Frequency       Precautions / Restrictions Precautions Precautions: Fall Required Braces or Orthoses: Other Brace/Splint Other Brace/Splint: Rt AFO for "torn ligaments" per pt; bil post-op shoes   Pertinent Vitals/Pain No c/o pain    ADL  Grooming: Performed;Wash/dry hands;Wash/dry face;Teeth care;Supervision/safety;Set up;Min guard Where Assessed - Grooming: Supported standing Upper Body Bathing: Simulated;Min guard Where Assessed - Upper Body Bathing: Supported standing Lower Body Bathing: Simulated;Min guard Where Assessed - Lower Body Bathing: Supported standing;Supported sit to stand Upper Body Dressing: Performed;Supervision/safety;Set up;Min guard Where Assessed - Upper Body Dressing: Supported standing Lower Body Dressing: Performed;Supervision/safety;Set  up;Min guard Where Assessed - Lower Body Dressing: Unsupported sitting Toilet Transfer: Performed;Min guard Toilet Transfer Method: Sit to Barista: Regular height toilet;Grab bars Toileting - Clothing Manipulation and Hygiene: Performed;Supervision/safety Where Assessed - Engineer, mining and Hygiene: Standing Tub/Shower Transfer Method: Not assessed Equipment Used: Rolling walker Transfers/Ambulation Related to ADLs: cues for safety    OT Diagnosis:    OT Problem List: Decreased strength;Impaired balance (sitting and/or standing);Decreased knowledge of precautions;Decreased safety awareness OT Treatment Interventions:     OT Goals(Current goals can be found in the care plan section) Acute Rehab OT Goals Patient Stated Goal: return home with her husband  Visit Information  Last OT Received On: 05/13/13 Assistance Needed: +1 History of Present Illness: 79 yo female adm to [facility] with confusion, received work up for possible neuro event. Pt found to have UTI, CT head, brain MRI negative. Pt with h/o RA, essential HTN, cognitive deficits (family recently took away her license to drive), bil 2nd toe amputations, and per pt she has torn ligaments in her Rt foot. She has an AFO for RLE and wears bil post-op shoes       Prior Functioning     Home Living Family/patient expects to be discharged to:: Assisted living Living Arrangements: Spouse/significant other Available Help at Discharge: Family Type of Home: Independent living facility Home Access: Stairs to enter Entrance Stairs-Number of Steps: 2 Entrance Stairs-Rails: Right;Left Home Layout: One level Home Equipment: Grab bars - tub/shower;Walker - 4 wheels;Shower seat Prior Function Level of Independence: Independent Gait / Transfers Assistance Needed: Pt states that hse is Ind. ? supervision provided by husband--not present to confirm Communication Communication:  No difficulties Dominant  Hand: Right         Vision/Perception Vision - History Baseline Vision: Wears glasses all the time Patient Visual Report: No change from baseline Perception Perception: Within Functional Limits   Cognition  Cognition Arousal/Alertness: Awake/alert Behavior During Therapy: WFL for tasks assessed/performed Overall Cognitive Status: No family/caregiver present to determine baseline cognitive functioning    Extremity/Trunk Assessment Upper Extremity Assessment Upper Extremity Assessment: Overall WFL for tasks assessed;Generalized weakness     Mobility Bed Mobility Bed Mobility: Supine to Sit;Sit to Supine;Sitting - Scoot to Edge of Bed Supine to Sit: 5: Supervision;HOB flat Sitting - Scoot to [place] of Bed: 5: Supervision Sit to Supine: HOB flat;6: Modified independent (Device/Increase time) Details for Bed Mobility Assistance: supervision for safety as bed is a bit tall for her with risk of sliding off edge Transfers Transfers: Sit to Stand;Stand to Sit Sit to Stand: 4: Min guard;From bed;From toilet Stand to Sit: 4: Min guard Details for Transfer Assistance: vc for safe use of RW; tends to "park" RW off to the side when preparing to sit     Exercise     Balance Balance Balance Assessed: Yes Dynamic Standing Balance Dynamic Standing - Balance Support: No upper extremity supported;During functional activity Dynamic Standing - Level of Assistance: 4: Min assist;Other (comment) (min guard A) Dynamic Standing - Comments: reaching to her feet to adjust her brace/shoe   End of Session OT - End of Session Equipment Utilized During Treatment: Rolling walker Activity Tolerance: Patient tolerated treatment well Patient left: in bed;with call bell/phone within reach;with bed alarm set  GO     Galen Manila 05/13/2013, 11:25 AM

## 2013-05-13 NOTE — Progress Notes (Signed)
Nutrition Brief Note  Nutrition consult received from Dr. Timothy Lasso. Per MD, RD to not evaluate patient until 8/20.  RD to continue to follow and will complete assessment tomorrow.  Jarold Motto MS, RD, LDN Pager: [phone] After-hours pager: [phone]

## 2013-05-13 NOTE — Clinical Documentation Improvement (Signed)
THIS DOCUMENT IS NOT A PERMANENT PART OF THE MEDICAL RECORD  Please update your documentation with the medical record to reflect your response to this query. If you need help knowing how to do this please call [phone].  05/13/13   Dear Dr. Timothy Lasso,   Per H&P patient admitted with acute confusion which in the coding world is a low weighted nonspecific symptom code. If possible, please clarify this with a more specific diagnosis to better illustrate the patient's severity of illness and risk of mortality. Thank you.   Possible Clinical Conditions? -  Late effect of CVA exacerbated by infection -  Encephalopathy  -  Toxic/metabolic encephalopathy -  Other (please specify)  You may use possible, probable, or suspect with inpatient documentation. possible, probable, suspected diagnoses MUST be documented at the time of discharge  Reviewed: additional documentation in the medical record  Thank You,  Beverley Fiedler RN BSN Clinical Documentation Specialist: [phone]  Health Information Management [place]

## 2013-05-13 NOTE — Evaluation (Signed)
Clinical/Bedside Swallow Evaluation Patient Details  Name: Kenzey Birkland MRN: [id number] Date of Birth: June 21, 1918  Today's Date: 05/13/2013 Time: 4098-1191 SLP Time Calculation (min): 30 min  Past Medical History:  Past Medical History  Diagnosis Date  . Hypertension   . Arthritis   . TIA (transient ischemic attack)    Past Surgical History:  Past Surgical History  Procedure Laterality Date  . Toe amputation Bilateral 05/26/12    bilateral second toe amputations   HPI:  79 yo female adm to [facility] with confusion,  received work up for possible neuro event.  Pt found to have UTI, CT head, brain MRI negative.  Pt did not pass a RN stroke swallow screen as she "choked" on water via straw.  Pt with h/o RA, reflux, essential HTN, cognitive deficits.  Pt did not recall having stroke swallow screen yesterday and is fully alert today oriented to place, person.  Order for slp swallow evaluation received.     Assessment / Plan / Recommendation Clinical Impression  Pt presents with functional oropharyngeal swallow ability without s/s of aspiration, timely swallow and clear voice throughout.  Pt denies difficulties swallowing and admits she does not drink enough water as she doesn't want to use restroom frequently.  Suspect resolution of symptoms due to improved medical status.    Rec regular/thin with precautions.      Aspiration Risk  Mild    Diet Recommendation Thin liquid;Regular   Liquid Administration via: Cup;Straw Medication Administration: Whole meds with liquid Supervision: Patient able to self feed Compensations: Slow rate;Small sips/bites Postural Changes and/or Swallow Maneuvers: Seated upright 90 degrees;Out of bed for meals;Upright 30-60 min after meal    Other  Recommendations Oral Care Recommendations: Oral care QID   Follow Up Recommendations  None    Frequency and Duration   n/a     Pertinent Vitals/Pain Afebrile, decreased    SLP Swallow Goals     Swallow  Study Prior Functional Status   unknown    General Date of Onset: 05/13/13 HPI: 79 yo female adm to [facility] with confusion,  received work up for possible neuro event.  Pt found to have UTI, CT head, brain MRI negative.  Pt did not pass a RN stroke swallow screen as she "choked" on water via straw.  Pt with h/o RA, reflux, essential HTN, cognitive deficits.  Pt did not recall having stroke swallow screen yesterday and is fully alert today oriented to place, person.  Order for slp swallow evaluation received.   Type of Study: Bedside swallow evaluation Diet Prior to this Study: NPO Temperature Spikes Noted: No Respiratory Status: Room air History of Recent Intubation: No Behavior/Cognition: Alert;Cooperative;Pleasant mood Oral Cavity - Dentition: Adequate natural dentition Self-Feeding Abilities: Able to feed self Patient Positioning: Upright in bed Baseline Vocal Quality: Clear Volitional Cough: Strong Volitional Swallow: Able to elicit    Oral/Motor/Sensory Function Overall Oral Motor/Sensory Function: Appears within functional limits for tasks assessed Lingual ROM:  (slight tip deviation to right upon protrusion) Velum:  (silght deviation to left upon phonation)   Ice Chips Ice chips: Not tested   Thin Liquid Thin Liquid: Within functional limits Presentation: Cup;Self Fed;Straw    Nectar Thick Nectar Thick Liquid: Not tested   Honey Thick Honey Thick Liquid: Not tested   Puree Puree: Within functional limits Presentation: Spoon;Self Fed   Solid   GO    Solid: Within functional limits Presentation: Self Lisabeth Pick, MS [facility] SLP [phone]

## 2013-05-13 NOTE — Evaluation (Signed)
Physical Therapy Evaluation Patient Details Name: Alexa Rowe MRN: [id number] DOB: 01/03/1918 Today's Date: 05/13/2013 Time: 0865-7846 PT Time Calculation (min): 34 min  PT Assessment / Plan / Recommendation History of Present Illness  79 yo female adm to [facility] with confusion, received work up for possible neuro event. Pt found to have UTI, CT head, brain MRI negative. Pt with h/o RA, essential HTN, cognitive deficits (family recently took away her license to drive), bil 2nd toe amputations, and per pt she has torn ligaments in her Rt foot. She has an AFO for RLE and wears bil post-op shoes  Clinical Impression  Pt adm with confusion and found to have a UTI. She demonstrates decr safety with use of RW and could benefit from continued PT to incr safety in her home environment. Would prefer input of husband and son re: whether she is safe to return to her independent living apartment with her husband vs need for skilled rehab unit at [facility]. No family currently available.     PT Assessment  Patient needs continued PT services    Follow Up Recommendations  Home health PT;Supervision/Assistance - 24 hour    Does the patient have the potential to tolerate intense rehabilitation      Barriers to Discharge        Equipment Recommendations  None recommended by PT    Recommendations for Other Services     Frequency Min 2X/week    Precautions / Restrictions Precautions Precautions: Fall Required Braces or Orthoses: Other Brace/Splint Other Brace/Splint: Rt AFO for "torn ligaments" per pt; bil post-op shoes   Pertinent Vitals/Pain Denied pain       Mobility  Bed Mobility Bed Mobility: Supine to Sit;Sit to Supine;Sitting - Scoot to Edge of Bed Supine to Sit: 5: Supervision;HOB flat Sitting - Scoot to [place] of Bed: 5: Supervision Sit to Supine: HOB flat;6: Modified independent (Device/Increase time) Details for Bed Mobility Assistance: supervision for safety as bed is a bit  tall for her with risk of sliding off edge Transfers Transfers: Sit to Stand;Stand to Sit Sit to Stand: 4: Min guard Stand to Sit: 4: Min guard Details for Transfer Assistance: vc for safe use of RW; tends to "park" RW off to the side when preparing to sit Ambulation/Gait Ambulation/Gait Assistance: 4: Min guard Ambulation Distance (Feet): 200 Feet Assistive device: Rolling walker Ambulation/Gait Assistance Details: vc for safe use of RW (pushes too far ahead) Gait Pattern: Step-through pattern;Decreased stride length;Trunk flexed    Exercises     PT Diagnosis: Difficulty walking  PT Problem List: Decreased knowledge of use of DME;Decreased safety awareness PT Treatment Interventions: DME instruction;Gait training;Stair training;Functional mobility training;Therapeutic activities;Cognitive remediation;Patient/family education     PT Goals(Current goals can be found in the care plan section) Acute Rehab PT Goals Patient Stated Goal: return home with her husband PT Goal Formulation: With patient Time For Goal Achievement: 05/20/13 Potential to Achieve Goals: Good  Visit Information  Last PT Received On: 05/13/13 Assistance Needed: +1 History of Present Illness: 79 yo female adm to [facility] with confusion, received work up for possible neuro event. Pt found to have UTI, CT head, brain MRI negative. Pt with h/o RA, essential HTN, cognitive deficits (family recently took away her license to drive), bil 2nd toe amputations, and per pt she has torn ligaments in her Rt foot. She has an AFO for RLE and wears bil post-op shoes       Prior Functioning  Home Living Family/patient expects to  be discharged to:: Assisted living Living Arrangements: Spouse/significant other Available Help at Discharge: Family (68 yo husband and son intermittently) Type of Home: Independent living facility Home Access: Stairs to enter Entrance Stairs-Number of Steps: 2 (per pt; ? reliable) Entrance Stairs-Rails:  Right;Left Home Layout: One level Home Equipment: Grab bars - tub/shower;Walker - 4 wheels;Shower seat Prior Function Level of Independence: Independent Gait / Transfers Assistance Needed: per pt modified independent; ? supervision provided by husband--not present to confirm Communication Communication: No difficulties Dominant Hand: Right    Cognition  Cognition Arousal/Alertness: Awake/alert Behavior During Therapy: WFL for tasks assessed/performed Overall Cognitive Status: No family/caregiver present to determine baseline cognitive functioning (per chart h/o dementia; oriented to person, place; not situa)    Extremity/Trunk Assessment Upper Extremity Assessment Upper Extremity Assessment: Overall WFL for tasks assessed;Generalized weakness Lower Extremity Assessment Lower Extremity Assessment: Overall WFL for tasks assessed   Balance Balance Balance Assessed: Yes Static Sitting Balance Static Sitting - Balance Support: No upper extremity supported;Feet supported Static Sitting - Level of Assistance: 5: Stand by assistance Dynamic Sitting Balance Dynamic Sitting - Balance Support: No upper extremity supported;Feet supported Dynamic Sitting - Level of Assistance: 5: Stand by assistance Dynamic Sitting - Comments: reaching to floor for donning shoes, brace Static Standing Balance Static Standing - Balance Support: Bilateral upper extremity supported Static Standing - Level of Assistance: 5: Stand by assistance Dynamic Standing Balance Dynamic Standing - Balance Support: No upper extremity supported;During functional activity Dynamic Standing - Level of Assistance: 4: Min assist Dynamic Standing - Comments: reaching to her feet to adjust her brace/shoe  End of Session PT - End of Session Equipment Utilized During Treatment: Gait belt;Other (comment) (Rt AFO, bil post-op shoes) Activity Tolerance: Patient tolerated treatment well Patient left: in bed;with bed alarm set Nurse  Communication: Mobility status;Other (comment) (NT reports multiple attempts to climb out of bed with alarm)  GP     Alexa Rowe 05/13/2013, 10:58 AM  Pager [phone]

## 2013-05-13 NOTE — Progress Notes (Signed)
Subjective: Admitted last night with speech difficulties and Confusion. UTI + CCT (-) MRI - Chronic changes.  No acute intracranial findings including CVA. Much Closer to baseline this am and she looks lots better. She recognizes me this am.  Objective: Vital signs in last 24 hours: Temp:  [97.2 F (36.2 C)-98.1 F (36.7 C)] 97.8 F (36.6 C) (08/19 0400) Pulse Rate:  [59-108] 107 (08/19 0400) Resp:  [13-21] 20 (08/19 0400) BP: (129-180)/(54-88) 171/88 mmHg (08/19 0400) SpO2:  [95 %-99 %] 95 % (08/19 0400) Weight:  [50.848 kg (112 lb 1.6 oz)-53.887 kg (118 lb 12.8 oz)] 50.848 kg (112 lb 1.6 oz) (08/19 0400) Weight change:     CBG (last 3)  No results found for this basename: GLUCAP,  in the last 72 hours  Intake/Output from previous day: No intake or output data in the 24 hours ending 05/13/13 0714     Physical Exam General appearance: Pleasant. Less Confused. Speech clear and fluent.  Resp: CTA  Cardio: Reg  GI: soft, non-tender; bowel sounds normal; no masses, no organomegaly  Lymph nodes: no cervical lymphadenopathy  Neurologic: Alert and oriented, normal strength and tone. Normal symmetric reflexes.   Lab Results:  Recent Labs  05/12/13 1135 05/12/13 1311  NA 130*  --   K 3.9  --   CL 94*  --   CO2 27  --   GLUCOSE 115*  --   BUN 15  --   CREATININE 0.69  --   CALCIUM 9.6  --   MG  --  1.8  PHOS  --  3.3     Recent Labs  05/12/13 1135  AST 18  ALT 11  ALKPHOS 46  BILITOT 0.5  PROT 6.7  ALBUMIN 3.8     Recent Labs  05/12/13 1135  WBC 9.2  NEUTROABS 7.5  HGB 12.2  HCT 35.6*  MCV 87.9  PLT 203    Lab Results  Component Value Date   INR 0.96 06/28/2012    No results found for this basename: CKTOTAL, CKMB, CKMBINDEX, TROPONINI,  in the last 72 hours   Recent Labs  05/12/13 1311  TSH 1.290    No results found for this basename: VITAMINB12, FOLATE, FERRITIN, TIBC, IRON, RETICCTPCT,  in the last 72 hours  Micro Results: No  results found for this or any previous visit (from the past 240 hour(s)).   Studies/Results: Dg Chest 2 View  05/12/2013   *RADIOLOGY REPORT*  Clinical Data: Altered mental status.  CHEST - 2 VIEW  Comparison: 06/29/2012.  Findings: Trachea is midline.  Heart size normal.  There is added rounded density in the right infrahilar region, new from the prior exam.  Biapical pleural parenchymal scarring.  Lungs are hyperinflated with possible minimal atelectasis at the left costophrenic angle.  No definite pleural fluid.  IMPRESSION: Nodular added density in the right infrahilar region, new from 06/29/2012.  Non emergent CT chest without contrast is recommended, as clinically indicated.   Original Report Authenticated By: Leanna Battles, M.D.   Ct Head Wo Contrast  05/12/2013   *RADIOLOGY REPORT*  Clinical Data: Acute onset of confusion.  CT HEAD WITHOUT CONTRAST  Technique:  Contiguous axial images were obtained from the base of the skull through the vertex without contrast.  Comparison: Head CT 06/29/2012 and brain MR 06/30/2012.  Findings: Atrophy and chronic microvascular change are again seen. No evidence of acute intracranial abnormality including infarct, hemorrhage, mass lesion, mass effect, midline shift or abnormal extra axial  fluid collection is identified.  IMPRESSION: No acute finding. Stable compared to prior exam.   Original Report Authenticated By: Holley Dexter, M.D.   Mr Brain Wo Contrast  05/12/2013   *RADIOLOGY REPORT*  Clinical Data: Increasing confusion.  Difficulty following commands.  MRI HEAD WITHOUT CONTRAST  Technique:  Multiplanar, multiecho pulse sequences of the brain and surrounding structures were obtained according to standard protocol without intravenous contrast.  Comparison: 05/12/2013 CT.  MR head 06/30/2012.  Findings: There is no evidence for acute infarction, intracranial hemorrhage, mass lesion, hydrocephalus, or extra-axial fluid. There is advanced atrophy and  extensive small vessel disease.  Flow voids are maintained.  Calvarium intact.  Bilateral cataract extraction.  No midline abnormality.  No acute sinus or mastoid disease. No change from prior CT or MR.  IMPRESSION: Chronic changes as described.  No acute intracranial findings.   Original Report Authenticated By: Davonna Belling, M.D.     Medications: Scheduled: . aspirin EC  81 mg Oral Daily  . cefTRIAXone (ROCEPHIN)  IV  1 g Intravenous Q24H  . metoprolol  50 mg Oral BID  . senna-docusate  1 tablet Oral QHS  . simvastatin  40 mg Oral q1800  . verapamil  180 mg Oral Daily   Continuous: . sodium chloride 50 mL/hr at 05/12/13 2115     Assessment/Plan: Principal Problem:   Acute confusional state Active Problems:   Unspecified essential hypertension   Weakness generalized   Rheumatoid arthritis(714.0)   Esophageal reflux   Hyponatremia   CVA (cerebral infarction)   UTI (urinary tract infection)  AMS/Confusion -Metabolic and due to the UTI - Rx c IVF and IV Rocephin. Await U Cx. Time and reorientation will help.  She does have Short term memory issues and cognitive decline - Family realizes that she has mild Dementia.  H/O CVA s/P TPA 06/2012 with aggressive W/up at that time. No CVA on CCT or MRI  She unfortunately choked on liquid with a straw at bedside Swallow eval and is NPO until Speech Evaluates her. May need a formal swallow study. However since she is mentally alert and better this am - she may do better than expected. Hyponatremia - IVF c NS and follow.  HTN - BP erratic c ups and downs. Adjust prn. Hydral started. Lipids - Continue statin.  E Coli Pyelonephritis in July 2013.  GERD. PPI  CW/SW/PT/OT/ST consulted to help facilitate care. She usually needs [facility] after hospital Discharge  DVT Prophylaxis - SCDs provided  OVERACTIVE BLADDER - staying away from meds which may affect mind and memory.  OSTEOARTHRITIS (ICD-715.90) and RA.     ID -  Anti-infectives    Start     Dose/Rate Route Frequency Ordered Stop   05/13/13 1200  cefTRIAXone (ROCEPHIN) 1 g in dextrose 5 % 50 mL IVPB     1 g 100 mL/hr over 30 Minutes Intravenous Every 24 hours 05/12/13 1741     05/12/13 1330  cefTRIAXone (ROCEPHIN) 1 g in dextrose 5 % 50 mL IVPB     1 g 100 mL/hr over 30 Minutes Intravenous  Once 05/12/13 1320 05/12/13 1431       LOS: 1 day   Briahna Pescador M 05/13/2013, 7:14 AM

## 2013-05-14 LAB — BASIC METABOLIC PANEL
CO2: 25 mEq/L (ref 19–32)
Calcium: 9.4 mg/dL (ref 8.4–10.5)
Creatinine, Ser: 0.67 mg/dL (ref 0.50–1.10)
GFR calc non Af Amer: 73 mL/min — ABNORMAL LOW (ref >90)

## 2013-05-14 LAB — CBC
MCV: 88.3 fL (ref 78.0–100.0)
Platelets: 203 10*3/uL (ref 150–400)
RDW: 12.9 % (ref 11.5–15.5)
WBC: 6.6 10*3/uL (ref 4.0–10.5)

## 2013-05-14 MED ORDER — ENSURE COMPLETE PO LIQD
237.0000 mL | ORAL | Status: DC
  Administered 2013-05-14 – 2013-05-15 (×2): 237 mL via ORAL

## 2013-05-14 NOTE — Progress Notes (Signed)
INITIAL NUTRITION ASSESSMENT  DOCUMENTATION CODES Per approved criteria  -Severe  malnutrition in the context of social or environmental circumstances   INTERVENTION: Add Ensure Complete po daily, each supplement provides 350 kcal and 13 grams of protein. Discussed importance of hydration and nutrition. RD to continue to follow nutrition care plan.  NUTRITION DIAGNOSIS: Unintentional wt loss related to advanced age as evidenced by weight loss and severe muscle wasting.   Goal: Intake to meet >90% of estimated nutrition needs.  Monitor:  weight trends, lab trends, I/O's, PO intake, supplement tolerance  Reason for Assessment: MD Consult for Nutrition Assessment  79 y.o. female  Admitting Dx: Acute confusional state  ASSESSMENT: Hx of CVA. Admitted from [facility] with AMS and difficulty speaking. Work-up reveals UTI. MRI of brain showed no acute infarct.  SLP evaluated pt yesterday and completed BSE. SLP recommended regular textures with thin liquid consistencies. Per SLP note, "patient admits she does not drink enough water as she doesn't want to use restroom frequently." RD discussed importance of hydration with patient.  Ordered for a Heart Healthy diet, consuming 50-100% of meals. Pt reports that she was eating well PTA. She makes her own breakfast and lunch; dinner is provided by facility. She states that rarely skips meals and enjoys Ensure from time to time.  Pt with 9% wt loss x 5 months; this is significant for time frame. Noted pt with short-term memory issues and cognitive decline. She asked this RD to repeat her weight trend x 3 when obtaining nutrition hx from patient.  Nutrition Focused Physical Exam:  Subcutaneous Fat:  Orbital Region: WNL Upper Arm Region: moderately depleted Thoracic and Lumbar Region: n/a  Muscle:  Temple Region: moderately depleted Clavicle Bone Region: severely depleted Clavicle and Acromion Bone Region:  WNL Scapular Bone Region: n/a Dorsal Hand: n/a Patellar Region: n/a Anterior Thigh Region: n/a Posterior Calf Region: severe depletion  Edema: none  Pt meets criteria for severe MALNUTRITION in the context of social/environmental circumstances as evidenced by 9% wt loss x 5 months and severe muscle mass loss.  Emphasized the importance of nutrition to help keep weight stable and prevent further weight loss. Pt verbalized understanding and is agreeable to Ensure while inpatient.  Height: Ht Readings from Last 1 Encounters:  05/12/13 5\' 4"  (1.626 m)    Weight: Wt Readings from Last 1 Encounters:  05/14/13 112 lb 3.2 oz (50.894 kg)    Ideal Body Weight: 120 lb  % Ideal Body Weight: 93%  Wt Readings from Last 10 Encounters:  05/14/13 112 lb 3.2 oz (50.894 kg)  11/28/12 125 lb (56.7 kg)  06/30/12 120 lb 13 oz (54.8 kg)  04/20/12 127 lb 3.3 oz (57.7 kg)    Usual Body Weight: 120 - 125 lb  % Usual Body Weight: 91%  BMI:  Body mass index is 19.25 kg/(m^2). WNL  Estimated Nutritional Needs: Kcal: 1200 - 1400 Protein: 50 - 65 g Fluid: 1.5 liters daily  Skin: intact  Diet Order: Cardiac  EDUCATION NEEDS: -No education needs identified at this time   Intake/Output Summary (Last 24 hours) at 05/14/13 0906 Last data filed at 05/14/13 0905  Gross per 24 hour  Intake    800 ml  Output    750 ml  Net     50 ml    Last BM: 8/19  Labs:   Recent Labs Lab 05/12/13 1135 05/12/13 1311 05/13/13 0600  NA 130*  --  136  K 3.9  --  3.6  CL 94*  --  99  CO2 27  --  26  BUN 15  --  12  CREATININE 0.69  --  0.75  CALCIUM 9.6  --  9.7  MG  --  1.8  --   PHOS  --  3.3  --   GLUCOSE 115*  --  86    CBG (last 3)  No results found for this basename: GLUCAP,  in the last 72 hours  Scheduled Meds: . aspirin EC  81 mg Oral Daily  . cefTRIAXone (ROCEPHIN)  IV  1 g Intravenous Q24H  . hydrALAZINE  25 mg Oral Q12H  . metoprolol  50 mg Oral BID  . senna-docusate  1  tablet Oral QHS  . simvastatin  40 mg Oral q1800  . verapamil  180 mg Oral Daily    Continuous Infusions:   Past Medical History  Diagnosis Date  . Hypertension   . Arthritis   . TIA (transient ischemic attack)     Past Surgical History  Procedure Laterality Date  . Toe amputation Bilateral 05/26/12    bilateral second toe amputations    Jarold Motto MS, RD, LDN Pager: [phone] After-hours pager: [phone]

## 2013-05-14 NOTE — Progress Notes (Signed)
CSW called and left a message for facility , [facility], to inquire of bed availability for pt dc tomorrow. CSW also spoke with pt family to find out if they have chosen a back up option incase a bed is not available at chosen facility at time of dc. Pt family would like to speak with MD before making decision. CSW made family aware a decision will need to be made today so that appropriate arrangements can be made and can proceed with possible dc tomorrow. Family to call CSW back with decision later today.  Elysha Daw, LCSWA [phone]

## 2013-05-14 NOTE — Progress Notes (Signed)
Physical Therapy Treatment Patient Details Name: Alexa Rowe MRN: [id number] DOB: 15-Mar-1918 Today's Date: 05/14/2013 Time: 4098-1191 PT Time Calculation (min): 26 min  PT Assessment / Plan / Recommendation  History of Present Illness 79 yo female adm to [facility] with confusion, received work up for possible neuro event. Pt found to have UTI, CT head, brain MRI negative. Pt with h/o RA, essential HTN, cognitive deficits (family recently took away her license to drive), bil 2nd toe amputations, and per pt she has torn ligaments in her Rt foot. She has an AFO for RLE and wears bil post-op shoes   PT Comments   Pt very agreeable to ambulate.  Noted plan is for pt to return to SNF level at [facility] prior to returning to Independent Living with husband.    Follow Up Recommendations  SNF     Does the patient have the potential to tolerate intense rehabilitation     Barriers to Discharge        Equipment Recommendations  None recommended by PT    Recommendations for Other Services    Frequency Min 2X/week   Progress towards PT Goals Progress towards PT goals: Progressing toward goals  Plan Current plan remains appropriate    Precautions / Restrictions Precautions Precautions: Fall Required Braces or Orthoses: Other Brace/Splint Other Brace/Splint: Rt AFO for "torn ligaments" per pt; bil post-op shoes   Pertinent Vitals/Pain Denies pain.      Mobility  Bed Mobility Bed Mobility: Supine to Sit;Sitting - Scoot to Edge of Bed;Sit to Supine Supine to Sit: 5: Supervision;With rails;HOB elevated Sitting - Scoot to Edge of Bed: 5: Supervision Sit to Supine: 5: Supervision;HOB elevated Transfers Transfers: Sit to Stand;Stand to Sit Sit to Stand: 4: Min guard;With upper extremity assist;From bed Stand to Sit: 4: Min guard;With upper extremity assist;To bed Details for Transfer Assistance: cues to keep RW with her as she tends to set it aside then try to finish walking to bed.    Ambulation/Gait Ambulation/Gait Assistance: 4: Min guard Ambulation Distance (Feet): 300 Feet Assistive device: Rolling walker Ambulation/Gait Assistance Details: cues for upright posture, staying close to RW.   Gait Pattern: Step-through pattern;Decreased stride length;Trunk flexed Stairs: No Wheelchair Mobility Wheelchair Mobility: No    Exercises     PT Diagnosis:    PT Problem List:   PT Treatment Interventions:     PT Goals (current goals can now be found in the care plan section) Acute Rehab PT Goals Time For Goal Achievement: 05/20/13 Potential to Achieve Goals: Good  Visit Information  Last PT Received On: 05/14/13 Assistance Needed: +1 History of Present Illness: 79 yo female adm to [facility] with confusion, received work up for possible neuro event. Pt found to have UTI, CT head, brain MRI negative. Pt with h/o RA, essential HTN, cognitive deficits (family recently took away her license to drive), bil 2nd toe amputations, and per pt she has torn ligaments in her Rt foot. She has an AFO for RLE and wears bil post-op shoes    Subjective Data  Subjective: I'd love to get up.     Cognition  Cognition Arousal/Alertness: Awake/alert Behavior During Therapy: WFL for tasks assessed/performed Overall Cognitive Status: No family/caregiver present to determine baseline cognitive functioning (per chart h/o dementia; oriented to person, place; not situa)    Balance  Balance Balance Assessed: Yes Static Standing Balance Static Standing - Balance Support: No upper extremity supported Static Standing - Level of Assistance: 5: Stand by assistance  End  of Session PT - End of Session Equipment Utilized During Treatment: Gait belt Activity Tolerance: Patient tolerated treatment well Patient left: in bed;with call bell/phone within reach;with bed alarm set Nurse Communication: Mobility status   GP     Sunny Schlein, [place] [phone] 05/14/2013, 3:21 PM

## 2013-05-14 NOTE — Progress Notes (Signed)
Subjective: Admitted with speech difficulties and Confusion. UTI +, No CVA. A little confused this am but easily re-orients. Still on IV Abx. No new C/o  Objective: Vital signs in last 24 hours: Temp:  [97.5 F (36.4 C)-98.4 F (36.9 C)] 98 F (36.7 C) (08/20 0400) Pulse Rate:  [62-69] 64 (08/20 0400) Resp:  [18-20] 18 (08/20 0400) BP: (115-167)/(53-94) 167/94 mmHg (08/20 0400) SpO2:  [96 %-99 %] 99 % (08/20 0400) Weight:  [50.894 kg (112 lb 3.2 oz)] 50.894 kg (112 lb 3.2 oz) (08/20 0030) Weight change: -2.994 kg (-6 lb 9.6 oz)    CBG (last 3)  No results found for this basename: GLUCAP,  in the last 72 hours  Intake/Output from previous day:  Intake/Output Summary (Last 24 hours) at 05/14/13 0602 Last data filed at 05/14/13 0334  Gross per 24 hour  Intake    560 ml  Output    500 ml  Net     60 ml   08/19 0701 - 08/20 0700 In: 560 [P.O.:560] Out: 500 [Urine:500]   Physical Exam General appearance: Pleasant. Less Confused. Speech clear and fluent.  Resp: CTA  Cardio: Reg  GI: soft, non-tender; bowel sounds normal; no masses, no organomegaly  Lymph nodes: no cervical lymphadenopathy  Neurologic: Alert and oriented, normal strength and tone. Normal symmetric reflexes.   Lab Results:  Recent Labs  05/12/13 1135 05/12/13 1311 05/13/13 0600  NA 130*  --  136  K 3.9  --  3.6  CL 94*  --  99  CO2 27  --  26  GLUCOSE 115*  --  86  BUN 15  --  12  CREATININE 0.69  --  0.75  CALCIUM 9.6  --  9.7  MG  --  1.8  --   PHOS  --  3.3  --      Recent Labs  05/12/13 1135  AST 18  ALT 11  ALKPHOS 46  BILITOT 0.5  PROT 6.7  ALBUMIN 3.8     Recent Labs  05/12/13 1135 05/13/13 0600  WBC 9.2 6.8  NEUTROABS 7.5  --   HGB 12.2 12.7  HCT 35.6* 36.4  MCV 87.9 88.3  PLT 203 185    Lab Results  Component Value Date   INR 0.96 06/28/2012    No results found for this basename: CKTOTAL, CKMB, CKMBINDEX, TROPONINI,  in the last 72 hours   Recent Labs  05/12/13 1311  TSH 1.290    No results found for this basename: VITAMINB12, FOLATE, FERRITIN, TIBC, IRON, RETICCTPCT,  in the last 72 hours  Micro Results: Recent Results (from the past 240 hour(s))  URINE CULTURE     Status: None   Collection Time    05/12/13 11:59 AM      Result Value Range Status   Specimen Description URINE, CLEAN CATCH   Final   Special Requests NONE   Final   Culture  Setup Time     Final   Value: 05/12/2013 13:07     Performed at [facility] Count     Final   Value: >=100,000 COLONIES/ML     Performed at [facility]   Culture     Final   Value: ESCHERICHIA COLI     Performed at [facility]   Report Status 05/13/2013 FINAL   Final   Organism ID, Bacteria ESCHERICHIA COLI   Final     Studies/Results: Dg Chest 2 View  05/12/2013   *RADIOLOGY REPORT*  Clinical Data: Altered mental status.  CHEST - 2 VIEW  Comparison: 06/29/2012.  Findings: Trachea is midline.  Heart size normal.  There is added rounded density in the right infrahilar region, new from the prior exam.  Biapical pleural parenchymal scarring.  Lungs are hyperinflated with possible minimal atelectasis at the left costophrenic angle.  No definite pleural fluid.  IMPRESSION: Nodular added density in the right infrahilar region, new from 06/29/2012.  Non emergent CT chest without contrast is recommended, as clinically indicated.   Original Report Authenticated By: Leanna Battles, M.D.   Ct Head Wo Contrast  05/12/2013   *RADIOLOGY REPORT*  Clinical Data: Acute onset of confusion.  CT HEAD WITHOUT CONTRAST  Technique:  Contiguous axial images were obtained from the base of the skull through the vertex without contrast.  Comparison: Head CT 06/29/2012 and brain MR 06/30/2012.  Findings: Atrophy and chronic microvascular change are again seen. No evidence of acute intracranial abnormality including infarct, hemorrhage, mass lesion, mass effect, midline shift or abnormal  extra axial fluid collection is identified.  IMPRESSION: No acute finding. Stable compared to prior exam.   Original Report Authenticated By: Holley Dexter, M.D.   Mr Brain Wo Contrast  05/12/2013   *RADIOLOGY REPORT*  Clinical Data: Increasing confusion.  Difficulty following commands.  MRI HEAD WITHOUT CONTRAST  Technique:  Multiplanar, multiecho pulse sequences of the brain and surrounding structures were obtained according to standard protocol without intravenous contrast.  Comparison: 05/12/2013 CT.  MR head 06/30/2012.  Findings: There is no evidence for acute infarction, intracranial hemorrhage, mass lesion, hydrocephalus, or extra-axial fluid. There is advanced atrophy and extensive small vessel disease.  Flow voids are maintained.  Calvarium intact.  Bilateral cataract extraction.  No midline abnormality.  No acute sinus or mastoid disease. No change from prior CT or MR.  IMPRESSION: Chronic changes as described.  No acute intracranial findings.   Original Report Authenticated By: Davonna Belling, M.D.     Medications: Scheduled: . aspirin EC  81 mg Oral Daily  . cefTRIAXone (ROCEPHIN)  IV  1 g Intravenous Q24H  . hydrALAZINE  25 mg Oral Q12H  . metoprolol  50 mg Oral BID  . senna-docusate  1 tablet Oral QHS  . simvastatin  40 mg Oral q1800  . verapamil  180 mg Oral Daily   Continuous:     Assessment/Plan: Principal Problem:   Acute confusional state Active Problems:   Unspecified essential hypertension   Weakness generalized   Rheumatoid arthritis(714.0)   Esophageal reflux   Hyponatremia   CVA (cerebral infarction)   UTI (urinary tract infection)  AMS/Confusion -Metabolic and due to the UTI.  Continue to re-orient her. She does have Short term memory issues and cognitive decline - Family realizes that she has mild Dementia.  E coli UTI - Continue IV Rocephin based on sensitivities and D/c on Cefdinir 300 BID for 7 days tomorrow.  H/O CVA s/P TPA 06/2012 with aggressive  W/up at that time. No CVA on CCT or MRI   Hyponatremia - resolved  HTN - BP erratic c ups and downs. Hydral started. Lipids - Continue statin.  E Coli Pyelonephritis in July 2013.  GERD. PPI  CW/SW/PT/OT/ST consulted to help facilitate care. She usually needs [facility] after hospital Discharge.  Anticipate D/C to [facility] tomorrow. She did well with PT/OT and ST - diet ordered and IVF stopped. DVT Prophylaxis - SCDs provided  OVERACTIVE BLADDER - staying away from  meds which may affect mind and memory.  OSTEOARTHRITIS (ICD-715.90) and RA.     ID -  Anti-infectives   Start     Dose/Rate Route Frequency Ordered Stop   05/13/13 1200  cefTRIAXone (ROCEPHIN) 1 g in dextrose 5 % 50 mL IVPB     1 g 100 mL/hr over 30 Minutes Intravenous Every 24 hours 05/12/13 1741     05/12/13 1330  cefTRIAXone (ROCEPHIN) 1 g in dextrose 5 % 50 mL IVPB     1 g 100 mL/hr over 30 Minutes Intravenous  Once 05/12/13 1320 05/12/13 1431       LOS: 2 days   Jequan Shahin M 05/14/2013, 6:02 AM

## 2013-05-14 NOTE — Progress Notes (Addendum)
CSW spoke with [facility] and they do have a bed available for pt today. CSW reported that pt will most likely not be ready to dc until tomorrow but will update later today. Bed offer will still be available tomorrow. Pt nurse notified of bed availability.   Gael Londo, LCSWA [phone]

## 2013-05-15 ENCOUNTER — Encounter (HOSPITAL_COMMUNITY): Payer: Self-pay | Admitting: Internal Medicine

## 2013-05-15 LAB — BASIC METABOLIC PANEL
Calcium: 9.5 mg/dL (ref 8.4–10.5)
Chloride: 98 mEq/L (ref 96–112)
Creatinine, Ser: 0.72 mg/dL (ref 0.50–1.10)
GFR calc Af Amer: 83 mL/min — ABNORMAL LOW (ref >90)
Sodium: 133 mEq/L — ABNORMAL LOW (ref 135–145)

## 2013-05-15 LAB — CBC
Platelets: 200 10*3/uL (ref 150–400)
RDW: 12.9 % (ref 11.5–15.5)
WBC: 7 10*3/uL (ref 4.0–10.5)

## 2013-05-15 MED ORDER — ACETAMINOPHEN 325 MG PO TABS: 650.0000 mg | ORAL_TABLET | Freq: Four times a day (QID) | ORAL | Status: DC | PRN

## 2013-05-15 MED ORDER — CEFDINIR 300 MG PO CAPS: 300.0000 mg | ORAL_CAPSULE | Freq: Two times a day (BID) | ORAL | Status: DC

## 2013-05-15 MED ORDER — ENSURE COMPLETE PO LIQD: 237.0000 mL | ORAL | Status: DC

## 2013-05-15 MED ORDER — HYDRALAZINE HCL 25 MG PO TABS: ORAL_TABLET | ORAL | Status: DC

## 2013-05-15 NOTE — Progress Notes (Signed)
Clinical Social Work Department CLINICAL SOCIAL WORK PLACEMENT NOTE 05/15/2013  Patient:  Alexa Rowe, Alexa Rowe  Account Number:  [id number] Admit date:  05/12/2013  Clinical Social Worker:  Sharol Harness, Theresia Majors  Date/time:  05/13/2013 12:00 N  Clinical Social Work is seeking post-discharge placement for this patient at the following level of care:   SKILLED NURSING   (*CSW will update this form in Epic as items are completed)   05/13/2013  Patient/family provided with [facility] Department of Clinical Social Work's list of facilities offering this level of care within the geographic area requested by the patient (or if unable, by the patient's family).  05/13/2013  Patient/family informed of their freedom to choose among providers that offer the needed level of care, that participate in Medicare, Medicaid or managed care program needed by the patient, have an available bed and are willing to accept the patient.  05/13/2013  Patient/family informed of [facility]' ownership interest in [facility], as well as of the fact that they are under no obligation to receive care at this facility.  PASARR submitted to EDS on 05/14/2013 PASARR number received from EDS on 05/14/2013  FL2 transmitted to all facilities in geographic area requested by pt/family on  05/14/2013 FL2 transmitted to all facilities within larger geographic area on   Patient informed that his/her managed care company has contracts with or will negotiate with  certain facilities, including the following:     Patient/family informed of bed offers received:  05/14/2013 Patient chooses bed at [facility] Physician recommends and patient chooses bed at    Patient to be transferred to [facility] on  05/15/2013 Patient to be transferred to facility by [facility]  The following physician request were entered in Epic:   Additional Comments:  Otisha Spickler, LCSWA [phone]

## 2013-05-15 NOTE — Discharge Summary (Signed)
Physician Discharge Summary  DISCHARGE SUMMARY   Patient ID: Alexa Rowe MR#: [id number] DOB/AGE: 1918-06-26 79 y.o.   Attending Physician:Lanissa Cashen M  Patient's WUJ:WJXBJ,YNWG M, MD  Consults:  Neuro  Admit date: 05/12/2013 Discharge date: 05/15/2013  Discharge Diagnoses:  Principal Problem:   Acute confusional state Active Problems:   Unspecified essential hypertension   Weakness generalized   Rheumatoid arthritis(714.0)   Esophageal reflux   Hyponatremia   CVA (cerebral infarction)   UTI (urinary tract infection)   Patient Active Problem List   Diagnosis Date Noted  . Acute confusional state 05/12/2013  . UTI (urinary tract infection) 05/12/2013  . Pain in joint, ankle and foot 11/28/2012  . TIA (transient ischemic attack)   . Ulcer of toe of right foot 11/21/2012  . CVA (cerebral infarction) 06/29/2012  . Pyelonephritis, acute 04/19/2012  . Unspecified essential hypertension 04/19/2012  . Other and unspecified hyperlipidemia 04/19/2012  . Dehydration 04/19/2012  . Weakness generalized 04/19/2012  . Malignant neoplasm of ovary 04/19/2012  . Malignant neoplasm of breast (female), unspecified site 04/19/2012  . Aortic valve disorders 04/19/2012  . Mitral valve insufficiency and aortic valve insufficiency 04/19/2012  . Osteoarthrosis, unspecified whether generalized or localized, unspecified site 04/19/2012  . Disorder of bone and cartilage, unspecified 04/19/2012  . Unspecified transient cerebral ischemia 04/19/2012  . Rheumatoid arthritis(714.0) 04/19/2012  . Esophageal reflux 04/19/2012  . Abnormality of gait 04/19/2012  . Hypokalemia 04/19/2012  . Hyponatremia 04/19/2012   Past Medical History  Diagnosis Date  . Hypertension   . Arthritis   . TIA (transient ischemic attack)   . Cognitive decline   . UTI (lower urinary tract infection)     Discharged Condition: Stable   Discharge Medications:   Medication List         acetaminophen 325  MG tablet  Commonly known as:  TYLENOL  Take 2 tablets (650 mg total) by mouth every 6 (six) hours as needed for pain or fever.     aspirin 81 MG EC tablet  Take 1 tablet (81 mg total) by mouth daily.     cefdinir 300 MG capsule  Commonly known as:  OMNICEF  Take 1 capsule (300 mg total) by mouth 2 (two) times daily.     COD LIVER OIL PO  Take 1 tablet by mouth daily.     feeding supplement Liqd  Take 237 mLs by mouth daily.     gabapentin 100 MG capsule  Commonly known as:  NEURONTIN  Take 100 mg by mouth at bedtime.     HM VITAMIN D3 2000 UNITS Caps  Generic drug:  Cholecalciferol  Take 1 capsule by mouth daily.     hydrALAZINE 25 MG tablet  Commonly known as:  APRESOLINE  - Take 1 tablet (25 mg total) by mouth every 12 (twelve) hours.  - Hold if SBP < 120 or dizzy     metoprolol 50 MG tablet  Commonly known as:  LOPRESSOR  Take 50 mg by mouth 2 (two) times daily.     senna-docusate 8.6-50 MG per tablet  Commonly known as:  Senokot-S  Take 1 tablet by mouth at bedtime.     simvastatin 40 MG tablet  Commonly known as:  ZOCOR  Take 40 mg by mouth daily.     verapamil 180 MG 24 hr capsule  Commonly known as:  VERELAN PM  Take 180 mg by mouth daily.        Hospital Procedures: Dg Chest 2 View  05/12/2013   *RADIOLOGY REPORT*  Clinical Data: Altered mental status.  CHEST - 2 VIEW  Comparison: 06/29/2012.  Findings: Trachea is midline.  Heart size normal.  There is added rounded density in the right infrahilar region, new from the prior exam.  Biapical pleural parenchymal scarring.  Lungs are hyperinflated with possible minimal atelectasis at the left costophrenic angle.  No definite pleural fluid.  IMPRESSION: Nodular added density in the right infrahilar region, new from 06/29/2012.  Non emergent CT chest without contrast is recommended, as clinically indicated.   Original Report Authenticated By: Leanna Battles, M.D.   Ct Head Wo Contrast  05/12/2013   *RADIOLOGY  REPORT*  Clinical Data: Acute onset of confusion.  CT HEAD WITHOUT CONTRAST  Technique:  Contiguous axial images were obtained from the base of the skull through the vertex without contrast.  Comparison: Head CT 06/29/2012 and brain MR 06/30/2012.  Findings: Atrophy and chronic microvascular change are again seen. No evidence of acute intracranial abnormality including infarct, hemorrhage, mass lesion, mass effect, midline shift or abnormal extra axial fluid collection is identified.  IMPRESSION: No acute finding. Stable compared to prior exam.   Original Report Authenticated By: Holley Dexter, M.D.   Mr Brain Wo Contrast  05/12/2013   *RADIOLOGY REPORT*  Clinical Data: Increasing confusion.  Difficulty following commands.  MRI HEAD WITHOUT CONTRAST  Technique:  Multiplanar, multiecho pulse sequences of the brain and surrounding structures were obtained according to standard protocol without intravenous contrast.  Comparison: 05/12/2013 CT.  MR head 06/30/2012.  Findings: There is no evidence for acute infarction, intracranial hemorrhage, mass lesion, hydrocephalus, or extra-axial fluid. There is advanced atrophy and extensive small vessel disease.  Flow voids are maintained.  Calvarium intact.  Bilateral cataract extraction.  No midline abnormality.  No acute sinus or mastoid disease. No change from prior CT or MR.  IMPRESSION: Chronic changes as described.  No acute intracranial findings.   Original Report Authenticated By: Davonna Belling, M.D.    History of Present Illness: 54 F with H/O CVA s/P TPA 06/2012 which saved her life presented 05/12/13 with AMS and difficulty with speech. She was fluent and confused when I saw her and admitted her. She did not recognize me and did not recognize some of her children. She was at her baseline 1 night prior to admission. She was very fidgety. No CP/SOB/Ab issues. Her boot/Shoes are in the room as she has chronic feet issues. Seen in ED per Code Stroke and 79 YO  female with increased confusion. Etiology unclear. Possibilities include underlying infections or metabolic derangements exacerbating previous stroke deficits versus acute stroke.  If this is an acute stroke, she is obviously out of the window for thrombolysis.  Recommend:  1) CT head and MRI brain to evaluated for bleed or infarct. At this time will not order stroke work-up. IF MRI is positive for stroke, stroke team will make further recommendations and orders.  2) agree with workup for underlying infection.  3) TSH, Calcium, magnesium  She ruled in for a UTI and Rxed with Rocephin  She ruled out for CVA She unfortunately choked on liquid with a straw when confused had a ST consult.   Hospital Course: Admitted with speech difficulties and Confusion. Which improved nicely with treatment of her UTI UTI +, No CVA.  Gets a little confused but easily re-orients and is near baseline.  She has mild cognitive decline and possibly early dementia.  Placed on IV Abx = Rocephin and will switch  to PO Cefdinir for 7 more days..  No new C/o Oriented and pleasant (as usual) today  AMS/Confusion -Metabolic and due to the UTI. Improved with Rx. Continue to re-orient her. Simulate Day/Night. She does have Short term memory issues and cognitive decline - Family realizes that she has mild Dementia.  E coli UTI - IV Rocephin based on sensitivities and D/c on Cefdinir 300 BID for 7 days.  H/O CVA s/P TPA 06/2012 with aggressive W/up at that time. No CVA on CCT or MRI  Hyponatremia - resolved. Na 133-136 the last 3 checks. HTN - BP erratic c ups and downs. Hydral started. Hold if SBP < 120  Lipids - Continue statin.  E Coli Pyelonephritis in July 2013.  GERD. PPI  CW/SW/PT/OT/ST consulted to help facilitate care. D/C to [facility] after hospital Discharge. Anticipate D/C to [facility] today. FL-2 Signed. She did well with PT/OT and ST - diet ordered and IVF stopped. No Aspiration.  Nutrition saw  pt yesterday and: Add Ensure Complete po daily, each supplement provides 350 kcal and 13 grams of protein.  Discussed importance of hydration and nutrition.  For her deconditioning she has her walker. She also has her soft shell ankle support. DVT Prophylaxis - SCDs provided  OVERACTIVE BLADDER - staying away from meds which may affect mind and memory.  OSTEOARTHRITIS (ICD-715.90) and RA. Only tylenol for pains.  Keep functional.      Day of Discharge Exam BP 139/84  Pulse 75  Temp(Src) 97.9 F (36.6 C) (Oral)  Resp 18  Ht 5\' 4"  (1.626 m)  Wt 53.706 kg (118 lb 6.4 oz)  BMI 20.31 kg/m2  SpO2 98%  Physical Exam: General appearance: Pleasant. Less Confused. Speech clear and fluent. Oriented to place and me. Resp: CTA  Cardio: Reg  GI: soft, non-tender; bowel sounds normal; no masses, no organomegaly  Lymph nodes: no cervical lymphadenopathy  Neurologic: Alert and oriented, normal strength and tone. Normal symmetric reflexes   Discharge Labs:  Recent Labs  05/12/13 1311  05/14/13 1230 05/15/13 0400  NA  --   < > 134* 133*  K  --   < > 3.9 3.5  CL  --   < > 97 98  CO2  --   < > 25 24  GLUCOSE  --   < > 129* 94  BUN  --   < > 16 14  CREATININE  --   < > 0.67 0.72  CALCIUM  --   < > 9.4 9.5  MG 1.8  --   --   --   PHOS 3.3  --   --   --   < > = values in this interval not displayed.  Recent Labs  05/12/13 1135  AST 18  ALT 11  ALKPHOS 46  BILITOT 0.5  PROT 6.7  ALBUMIN 3.8    Recent Labs  05/12/13 1135  05/14/13 1427 05/15/13 0400  WBC 9.2  < > 6.6 7.0  NEUTROABS 7.5  --   --   --   HGB 12.2  < > 12.2 12.4  HCT 35.6*  < > 35.5* 36.0  MCV 87.9  < > 88.3 88.0  PLT 203  < > 203 200  < > = values in this interval not displayed. No results found for this basename: CKTOTAL, CKMB, CKMBINDEX, TROPONINI,  in the last 72 hours  Recent Labs  05/12/13 1311  TSH 1.290   No results found for this basename: VITAMINB12,  FOLATE, FERRITIN, TIBC, IRON, RETICCTPCT,   in the last 72 hours Lab Results  Component Value Date   INR 0.96 06/28/2012       Discharge instructions:  03-Skilled Nursing Facility Follow-up Information   Follow up with Gwen Pounds, MD In 14 days.   Specialty:  Internal Medicine   Contact information:   [address] [facility] [place] [address] [phone]        Disposition: [facility]  Follow-up Appts: Follow-up with Dr. Timothy Lasso at [facility] in 2 weeks or after D/C from Rehab whichever comes second.  Call for appointment.  Condition on Discharge: stable  Tests Needing Follow-up: BMET in 1 week U Cx after done with Abx.  Time spent in discharge (includes decision making & examination of pt): 40 min  Signed: Gregoire Bennis M 05/15/2013, 7:17 AM

## 2013-05-15 NOTE — Progress Notes (Signed)
CSW spoke with facility and they will need pt to be at facility prior to lunch in order to have someone available to receive pt. CSW placed pt dc packet with shadow chart and arranged transport for 11:15am. Pt family and nurse have been notified. CSW signing off.  Alexa Rowe, LCSWA [phone]

## 2013-05-18 ENCOUNTER — Encounter: Payer: Self-pay | Admitting: Geriatric Medicine

## 2013-05-18 ENCOUNTER — Non-Acute Institutional Stay (SKILLED_NURSING_FACILITY): Payer: Medicare Other | Admitting: Geriatric Medicine

## 2013-05-18 DIAGNOSIS — R5381 Other malaise: Secondary | ICD-10-CM

## 2013-05-18 DIAGNOSIS — I10 Essential (primary) hypertension: Secondary | ICD-10-CM

## 2013-05-18 DIAGNOSIS — N390 Urinary tract infection, site not specified: Secondary | ICD-10-CM | POA: Insufficient documentation

## 2013-05-18 NOTE — Assessment & Plan Note (Signed)
Urinary tract infection apparently responsible for changing patient's mental status. Patient is asymptomatic today, will continue oral antibiotic as scheduled.

## 2013-05-18 NOTE — Assessment & Plan Note (Signed)
Blood pressure satisfactory at this time, the medication was started during hospitalization, Alexa Rowe Will monitor blood pressure/pulse each shift.

## 2013-05-18 NOTE — Assessment & Plan Note (Signed)
Patient with deconditioning due to acute illness and hospitalization. [facility] also reports concerns about patient's general ability to manage in independent living setting. She will be evaluated by PT/OT to help determine appropriate living setting.

## 2013-05-18 NOTE — Progress Notes (Signed)
Patient ID: Alexa Rowe, female   DOB: 1918-02-28, 79 y.o.   MRN: [id number]  [facility]    Code Status: Living Will, Full Code Contact Information   Name Relation Home Work Mobile   Spencer Son [phone]     Lejla, Moeser [phone]  [phone]   Adean, Milosevic   [phone]   Ernesto, Lashway [phone]        Chief Complaint  Patient presents with  . Hospitalization Follow-up    UTI, debility    HPI: This is 79 y.o. female resident of [facility]  Section was admitted to hospital on 05/12/2013 with confusion and difficulty speaking. ED evaluation included brain imaging which ruled out a stroke. Lab and urinalysis ruled in for urinary tract infection. This was initially treated with IV IV Rocephin, and change to p.o. Ceftin here at hospital discharge. She was confusion resolved with treatment of her infection, she does have mild cognitive decline manifested by short-term memory loss. Blood pressure was erratic during hospitalization, hydralazine was started. Patient was evaluated by social work as well as PT/OT/ST. All agreed hospital discharge to [facility] section for continued reconditioning and assessment for appropriate level of care.    No Known Allergies Medications reviewed  DATA REVIEWED  Radiologic Exams:   05/12/2013 chest x-ray, CT head without,, MR brain without contrast reviewed  Cardiovascular Exams:   Laboratory Studies:    Hospital Lab Lab Results  Component Value Date   WBC 7.0 05/15/2013   HGB 12.4 05/15/2013   HCT 36.0 05/15/2013   PLT 200 05/15/2013   GLUCOSE 94 05/15/2013   CHOL 141 07/02/2012   TRIG 78 07/02/2012   HDL 61 07/02/2012   LDLCALC 64 07/02/2012   ALT 11 05/12/2013   AST 18 05/12/2013   NA 133* 05/15/2013   K 3.5 05/15/2013   CL 98 05/15/2013   CREATININE 0.72 05/15/2013   BUN 14 05/15/2013   CO2 24 05/15/2013   TSH 1.290 05/12/2013   INR 0.96  06/28/2012   HGBA1C 5.7* 07/01/2012   Past Medical History  Diagnosis Date  . Hypertension   . Arthritis   . TIA (transient ischemic attack)   . Cognitive decline   . UTI (lower urinary tract infection)   . Debility   . GERD (gastroesophageal reflux disease)    Past Surgical History  Procedure Laterality Date  . Toe amputation Bilateral 05/26/12    bilateral second toe amputations   Family Status  Relation Status Death Age  . Father Deceased 97    Heart disease  . Mother Deceased 11    Leukemia  . Brother Deceased 64    "old age"  . Brother Alive     AD  . 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   No Smoking history, No  Alcohol history      EXERCISE/ ACTIVITIES:  Played tennis until age 61. Drives, reads. Attends [employer] meeting      Patient has Advanced planning document: Living Will               Review of Systems  DATA OBTAINED: from patient,  medical record GENERAL: Feels well   No fevers, fatigue, change in appetite or weight SKIN: No itch, rash or open  wounds EYES: No eye pain, dryness or itching  No change in vision EARS: No earache, change in hearing NOSE: No congestion, drainage or bleeding MOUTH/THROAT: No mouth or tooth pain  No sore throat No difficulty chewing or swallowing RESPIRATORY: No cough, wheezing, SOB CARDIAC: No chest pain, palpitations  No edema. GI: No abdominal pain  No N/V/D or constipation  No heartburn or reflux  GU: No dysuria, frequency or urgency  No change in urine volume or character   MUSCULOSKELETAL: No joint pain, swelling or stiffness  No back pain  No muscle ache, pain, weakness  Gait is unsteady   NEUROLOGIC: No dizziness, fainting, headache  No change in mental status (STML).  PSYCHIATRIC: No feelings of anxiety,  depression Sleeps well.  No behavior issue.    Physical Exam Filed Vitals:   05/15/13 2021  BP: 115/69  Pulse: 63  Temp: 97 F (36.1 C)  Resp: 14  Height: 5\' 4"  (1.626 m)  Weight: 118 lb (53.524 kg)   Body mass index is 20.24 kg/(m^2).  GENERAL APPEARANCE: No acute distress, appropriately groomed, frail body habitus. Alert, pleasant, conversant. SKIN: No diaphoresis, rash, unusual lesions, wounds HEAD: Normocephalic, atraumatic EYES: Conjunctiva/lids clear. Pupils round, reactive. Marland Kitchen  EARS: Decreased Hearing   NOSE: No deformity or discharge. MOUTH/THROAT: Lips w/o lesions. Oral mucosa, tongue moist, w/o lesion. Oropharynx w/o redness or lesions.  NECK: Supple, full ROM. No thyroid tenderness, enlargement or nodule LYMPHATICS: No head, neck or supraclavicular adenopathy RESPIRATORY: Breathing is even, unlabored. Lung sounds are clear and full.  CARDIOVASCULAR: Heart RRR. No murmur or extra heart sounds  ARTERIAL: No carotid bruit.   VENOUS: No varicosities. No venous stasis skin changes  EDEMA: No peripheral edema. GASTROINTESTINAL: Abdomen is soft, non-tender, not distended w/ normal bowel sounds.  MUSCULOSKELETAL: Moves UE extremities with full ROM, strength and tone. AFO present Rt.foot/ankle (torn ligaments). Steadily transfers herself from chair to [facility] and back  NEUROLOGIC: Not Oriented to time, person. Speech clear, no tremor.  PSYCHIATRIC: Mood and affect appropriate to situation  ASSESSMENT/PLAN  Unspecified essential hypertension Blood pressure satisfactory at this time, the medication was started during hospitalization, [place] Will monitor blood pressure/pulse each shift.  UTI (urinary tract infection) Urinary tract infection apparently responsible for changing patient's mental status. Patient is asymptomatic today, will continue oral antibiotic as scheduled.  Debility Patient with deconditioning due to acute illness and hospitalization. [facility] also  reports concerns about patient's general ability to manage in independent living setting. She will be evaluated by PT/OT to help determine appropriate living setting.   Follow up: AS needed  Cedric Mcclaine T.Maira Christon, NP-C 05/18/2013

## 2013-05-19 ENCOUNTER — Encounter: Payer: Self-pay | Admitting: Geriatric Medicine

## 2013-05-19 ENCOUNTER — Non-Acute Institutional Stay (SKILLED_NURSING_FACILITY): Payer: Medicare Other | Admitting: Geriatric Medicine

## 2013-05-19 DIAGNOSIS — N390 Urinary tract infection, site not specified: Secondary | ICD-10-CM

## 2013-05-19 DIAGNOSIS — I10 Essential (primary) hypertension: Secondary | ICD-10-CM

## 2013-05-19 DIAGNOSIS — R5381 Other malaise: Secondary | ICD-10-CM

## 2013-05-19 NOTE — Assessment & Plan Note (Signed)
Blood pressure and pulse remain controlled on current medication, range 114-156/87 last 3 days. Continue current medications, follow with Dr. Ferd Hibbs. schedule

## 2013-05-19 NOTE — Assessment & Plan Note (Addendum)
Asymptomatic. Continue p.o. antibiotics through September 28. Collect urine for followup culture August 29, result will be sent to Dr. Timothy Lasso Followup with Dr. Timothy Lasso as scheduled 05/29/2013

## 2013-05-19 NOTE — Assessment & Plan Note (Signed)
Patient is at baseline status; she ambulating independently with her walker, is independent with basic ADLs. She continues to have assistance at home with a caregiver 4 hours a day 3 days a week, a family member also assists several hours a day each day the week.

## 2013-05-19 NOTE — Progress Notes (Signed)
Patient ID: Alexa Rowe, female   DOB: [birth year], 79 y.o.   MRN: [id number] [facility] [phone])  Code Status: Living Will, Full Code Contact Information   Name Relation Home Work Mobile   [place] Son [phone]     Alexa, Rowe [phone]  [phone]   Alexa, Rowe   [phone]   Alexa, Rowe [phone]        Chief Complaint  Patient presents with  . Discharge Note    Rehab discharge    HPI: This is 79 y.o. female resident of [facility]  Section was admitted to hospital on 05/12/2013 with confusion and difficulty speaking. ED evaluation included brain imaging which ruled out a stroke. Lab and urinalysis ruled in for urinary tract infection. This was initially treated with IV Rocephin, changed to p.o. Ceftin at hospital discharge.  Confusion resolved with treatment of her infection, she does have mild cognitive decline manifested by short-term memory loss. Blood pressure was erratic during hospitalization, hydralazine was started. Patient was evaluated by social work as well as PT/OT/ST. All agreed hospital discharge to [facility] section for continued reconditioning and assessment for appropriate level of care. Nursing staff reports that this patient has improved significantly over the weekend, she is ambulating independently with a walker and is independent with ADLs.  Blood pressure has been reasonably controlled on current medication.  Patient and family are asking her to be discharged today with caregiver assistance at home    No Known Allergies   Medication List       This list is accurate as of: 05/19/13 11:27 AM.  Always use your most recent med list.               acetaminophen 325 MG tablet  Commonly known as:  TYLENOL  Take 2 tablets (650 mg total) by mouth every 6 (six) hours as needed for pain or fever.     aspirin 81 MG EC  tablet  Take 1 tablet (81 mg total) by mouth daily.     cefdinir 300 MG capsule  Commonly known as:  OMNICEF  Take 300 mg by mouth 2 (two) times daily.     COD LIVER OIL PO  Take 1 tablet by mouth daily.     feeding supplement Liqd  Take 237 mLs by mouth daily.     gabapentin 100 MG capsule  Commonly known as:  NEURONTIN  Take 100 mg by mouth at bedtime.     HM VITAMIN D3 2000 UNITS Caps  Generic drug:  Cholecalciferol  Take 1 capsule by mouth daily.     hydrALAZINE 25 MG tablet  Commonly known as:  APRESOLINE  - Take 1 tablet (25 mg total) by mouth every 12 (twelve) hours.  - Hold if SBP < 120 or dizzy     metoprolol 50 MG tablet  Commonly known as:  LOPRESSOR  Take 50 mg by mouth 2 (two) times daily.     senna-docusate 8.6-50 MG per tablet  Commonly known as:  Senokot-S  Take 1 tablet by mouth at bedtime.     simvastatin 40 MG tablet  Commonly known as:  ZOCOR  Take 40 mg by mouth daily.     verapamil 180 MG 24 hr capsule  Commonly known as:  VERELAN PM  Take 180 mg by mouth daily.       DATA REVIEWED  Radiologic Exams:   05/12/2013 chest x-ray, CT head without,, MR brain without  contrast reviewed  Cardiovascular Exams:   Laboratory Studies:    Hospital Lab Lab Results  Component Value Date   WBC 7.0 05/15/2013   HGB 12.4 05/15/2013   HCT 36.0 05/15/2013   PLT 200 05/15/2013   GLUCOSE 94 05/15/2013   CHOL 141 07/02/2012   TRIG 78 07/02/2012   HDL 61 07/02/2012   LDLCALC 64 07/02/2012   ALT 11 05/12/2013   AST 18 05/12/2013   NA 133* 05/15/2013   K 3.5 05/15/2013   CL 98 05/15/2013   CREATININE 0.72 05/15/2013   BUN 14 05/15/2013   CO2 24 05/15/2013   TSH 1.290 05/12/2013   INR 0.96 06/28/2012   HGBA1C 5.7* 07/01/2012  . Past Medical History  Diagnosis Date  . Hypertension   . Arthritis   . TIA (transient ischemic attack)   . Cognitive decline   . UTI (lower urinary tract infection)   . Debility   . GERD (gastroesophageal reflux disease)    Past  Surgical History  Procedure Laterality Date  . Toe amputation Bilateral 05/26/12    bilateral second toe amputations   Family Status  Relation Status Death Age  . Father Deceased 71    Heart disease  . Mother Deceased 18    Leukemia  . Brother Deceased 59    "old age"  . Brother Alive     AD  . 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   No Smoking history, No  Alcohol history      EXERCISE/ ACTIVITIES:  Played tennis until age 28. Drives, reads. Attends [employer] meeting      Patient has Advanced planning document: Living Will               Review of Systems  DATA OBTAINED: from patient, nurse, medical record GENERAL: Feels well   No fevers, fatigue, change in appetite or weight SKIN: No itch, rash or open wounds EYES: No eye pain, dryness or itching  No change in vision EARS: No earache, change in hearing NOSE: No congestion, drainage or bleeding MOUTH/THROAT: No mouth or tooth pain  No sore throat No difficulty chewing or swallowing RESPIRATORY: No cough, wheezing, SOB CARDIAC: No chest pain, palpitations  No edema. GI: No abdominal pain  No N/V/D or constipation  No heartburn or reflux  GU: No dysuria, frequency or urgency  No change in urine volume or character   MUSCULOSKELETAL: No joint pain, swelling or stiffness  No back pain  No muscle ache, pain, weakness  Gait is steady w/ walker   NEUROLOGIC: No dizziness, fainting, headache  No change in mental status (STML).  PSYCHIATRIC: No feelings of anxiety, depression Sleeps well.  No behavior issue.    Physical Exam Filed Vitals:   05/19/13 1123  BP: 157/77  Pulse: 76  Temp: 97.5 F (36.4 C)  Resp: 23  Weight: 118 lb (53.524 kg)  SpO2: 100%   Body mass index is 20.24 kg/(m^2).  GENERAL APPEARANCE: No acute  distress, appropriately groomed, frail body habitus. Alert, pleasant, conversant. SKIN: No diaphoresis, rash, unusual lesions, wounds HEAD: Normocephalic, atraumatic EYES: Conjunctiva/lids clear. Pupils round, reactive. Marland Kitchen  EARS: Decreased Hearing   NOSE: No deformity or discharge. MOUTH/THROAT: Lips w/o lesions. Alexa mucosa, tongue moist, w/o lesion. Oropharynx w/o redness or  lesions.  NECK: Supple, full ROM. No thyroid tenderness, enlargement or nodule LYMPHATICS: No head, neck or supraclavicular adenopathy RESPIRATORY: Breathing is even, unlabored. Lung sounds are clear and full.  CARDIOVASCULAR: Heart RRR. No murmur or extra heart sounds  ARTERIAL: No carotid bruit.   VENOUS: No varicosities. No venous stasis skin changes  EDEMA: No peripheral edema. GASTROINTESTINAL: Abdomen is soft, non-tender, not distended w/ normal bowel sounds.  MUSCULOSKELETAL: Moves UE extremities with full ROM, strength and tone. AFO present Rt.foot/ankle (torn ligaments). Steadily transfers herself from chair to [facility] and back  NEUROLOGIC: Not Oriented to time, person. Speech clear, no tremor.  PSYCHIATRIC: Mood and affect appropriate to situation  ASSESSMENT/PLAN  UTI (urinary tract infection) Asymptomatic. Continue p.o. antibiotics through September 28. Collect urine for followup culture August 29, result will be sent to Dr. Timothy Lasso Followup with Dr. Timothy Lasso as scheduled 05/29/2013  Debility Patient is at baseline status; she ambulating independently with her walker, is independent with basic ADLs. She continues to have assistance at home with a caregiver 4 hours a day 3 days a week, a family member also assists several hours a day each day the week.  Unspecified essential hypertension Blood pressure and pulse remain controlled on current medication, range 114-156/87 last 3 days. Continue current medications, follow with Dr. Ferd Hibbs. schedule   Follow up: repeat urine cx 05/23/13. Return to Dr. Timothy Lasso  05/29/13  Talana Slatten T.Nalia Honeycutt, NP-C 05/19/2013

## 2013-08-18 IMAGING — CR DG CHEST 1V PORT
1 series · 1 of 1 positions shown · non-contrast
Comparison: None.

CLINICAL DATA: Stroke symptoms

PORTABLE CHEST - 1 VIEW

[AP]
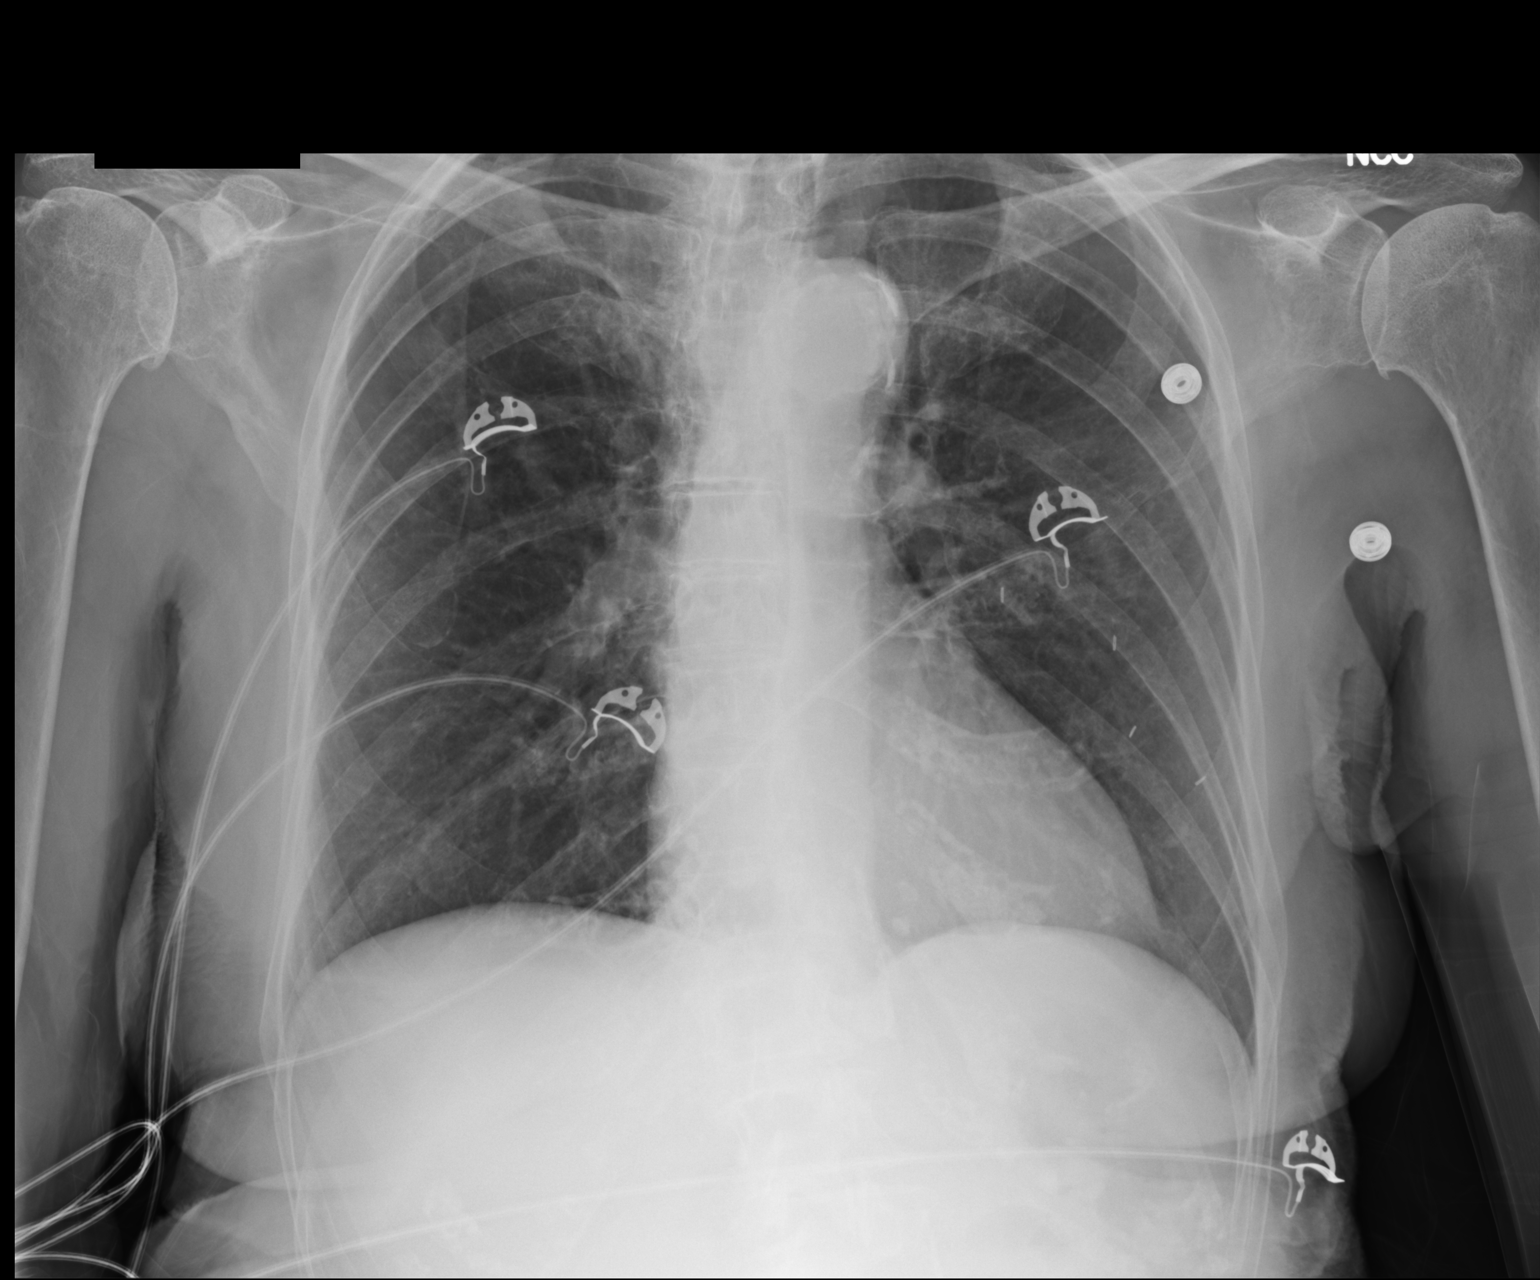

[1 of 1 positions shown; findings below may reference images not displayed]

FINDINGS: The cardiac silhouette is normal in size and
configuration.  No mediastinal or hilar mass or adenopathy.  Mild
scarring at the apices.  The lungs are otherwise clear.  Small
surgical vascular clips project over the left anterior chest wall
consistent with previous left breast surgery.  The bony thorax is
demineralized but intact.
IMPRESSION: No acute cardiopulmonary disease.

## 2013-08-18 IMAGING — CT CT HEAD W/O CM
1 of 2 series · 13 of 30 positions shown, 17 images · non-contrast
Comparison: 06/28/2012

CLINICAL DATA: Follow-up code stroke.

CT HEAD WITHOUT CONTRAST
TECHNIQUE: Contiguous axial images were obtained from the base of
the skull through the vertex without contrast.

[Series 3: head routine 4.8 h60s · axial · 0.43mm/px · z∈[+313,+461]mm · 13 of 36 slices shown, 17 images]
[im 3/36  brain]
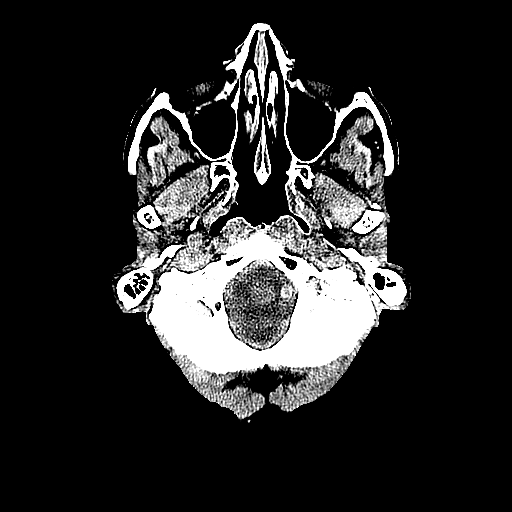
[im 3/36  bone]
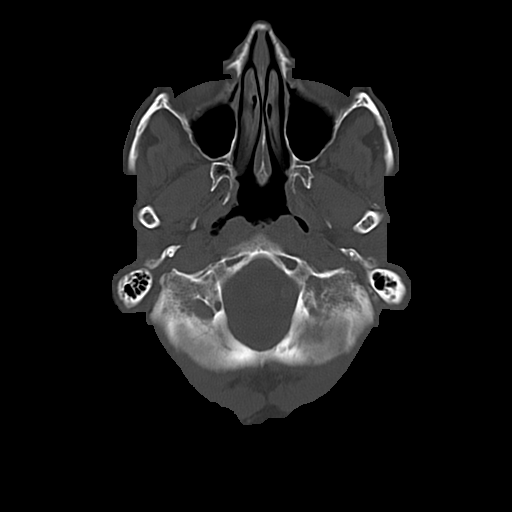
[im 5/36  brain]
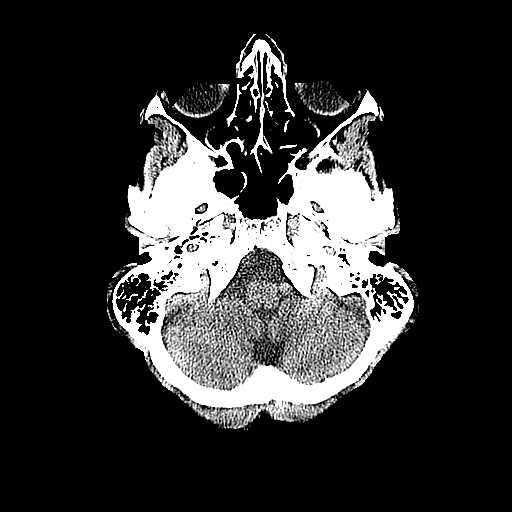
[im 8/36  brain]
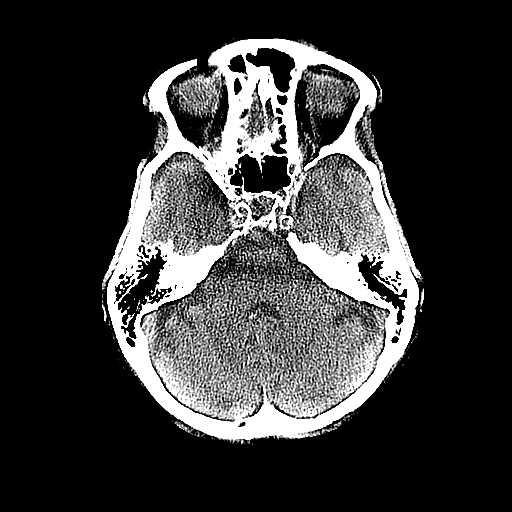
[im 10/36  brain]
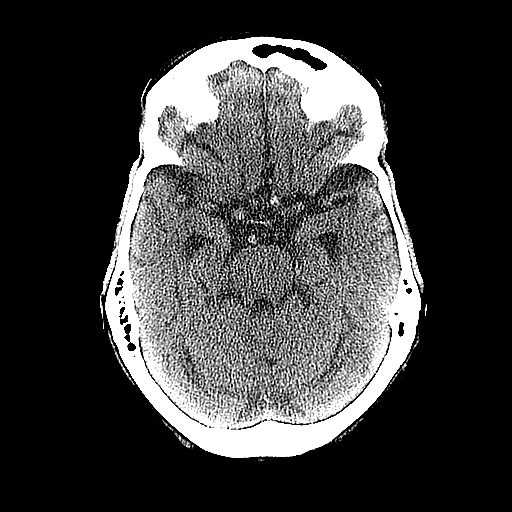
[im 12/36  brain]
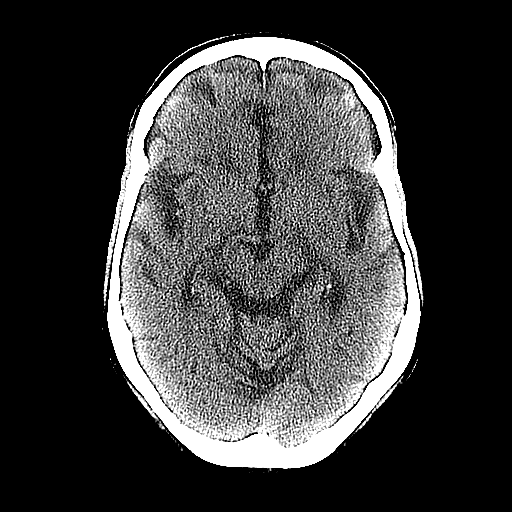
[im 12/36  bone]
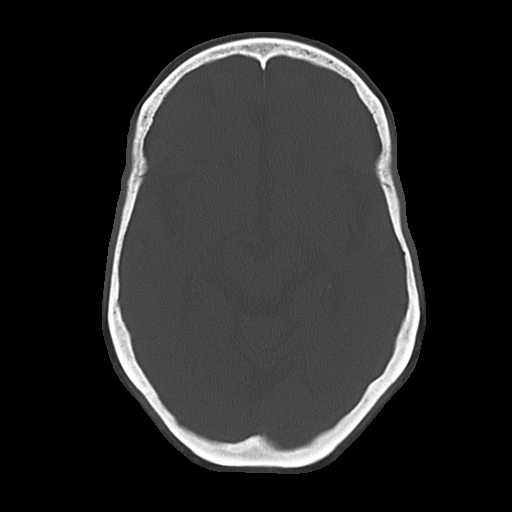
[im 15/36  brain]
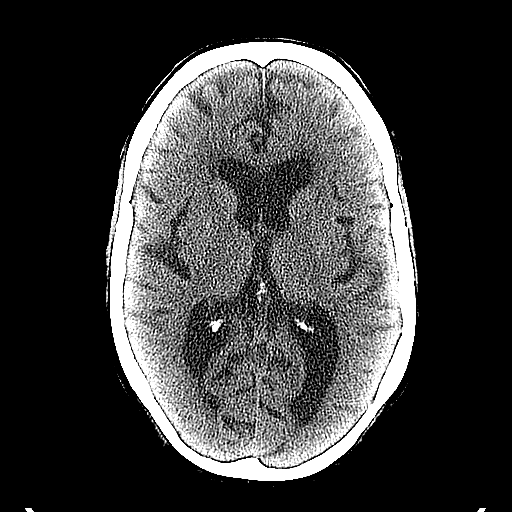
[im 17/36  brain]
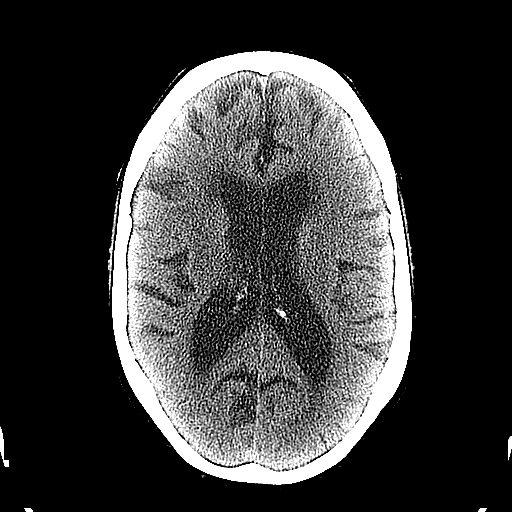
[im 19/36  brain]
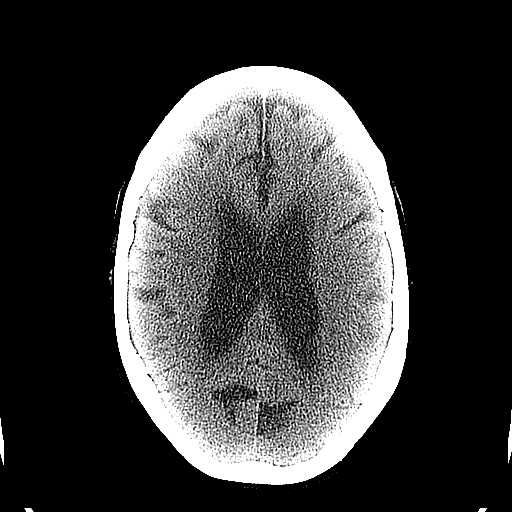
[im 24/36  brain]
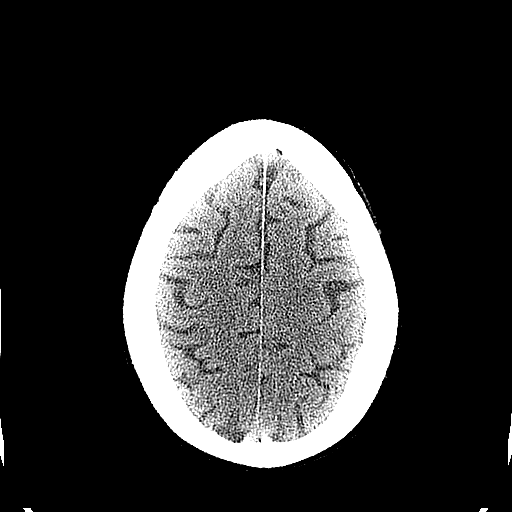
[im 24/36  bone]
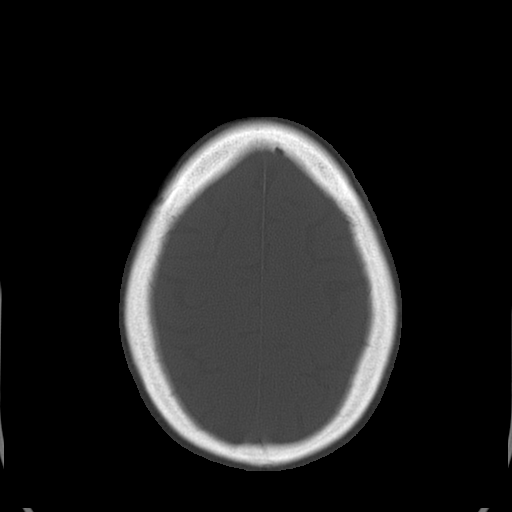
[im 26/36  brain]
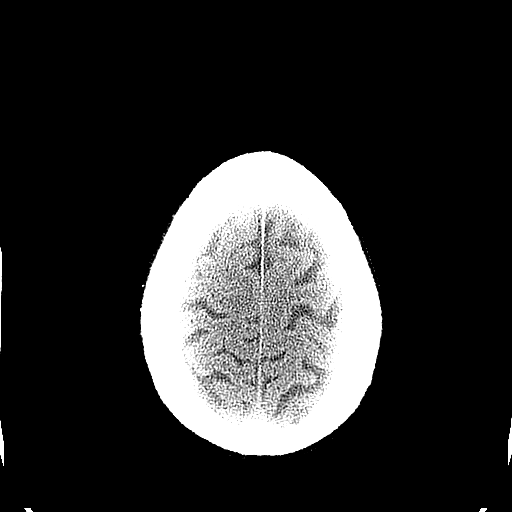
[im 29/36  brain]
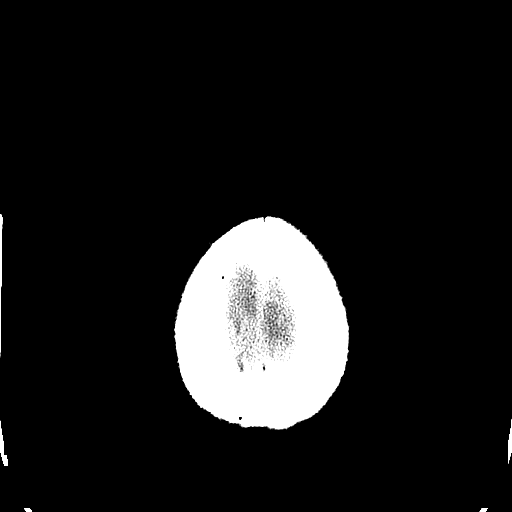
[im 31/36  brain]
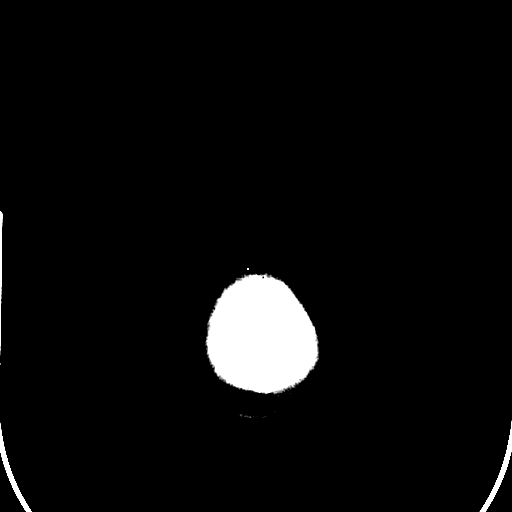
[im 33/36  brain]
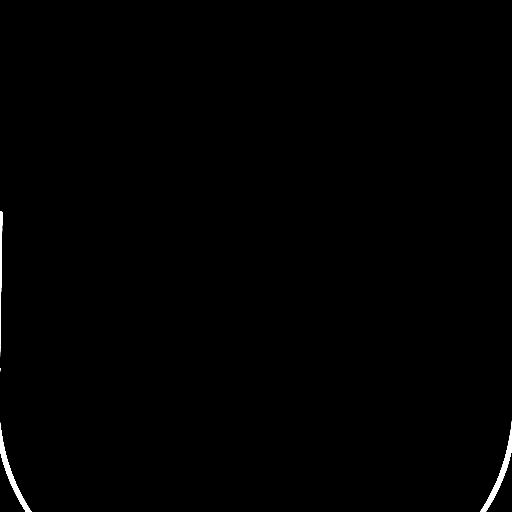
[im 33/36  bone]
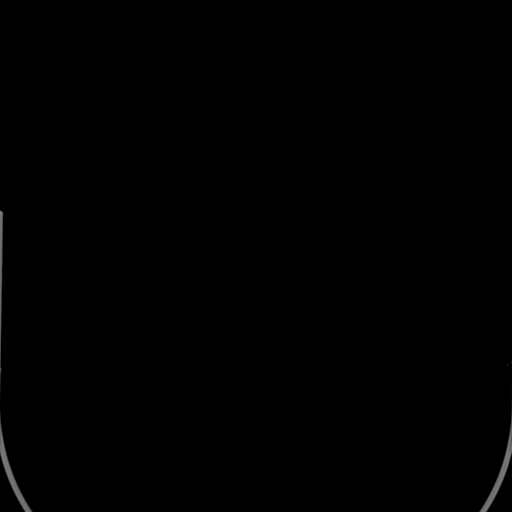

[13 of 30 positions shown; findings below may reference images not displayed]

FINDINGS: The brain shows generalized atrophy.  There are chronic
small vessel changes within the deep white matter.  No sign of
acute infarction, mass lesion, hemorrhage, hydrocephalus or extra-
axial collection.  There is atherosclerotic calcification of the
major vessels at the base of the brain.  Sinuses are clear.  No
calvarial abnormality.
IMPRESSION: Atrophy and chronic small vessel change.  No acute finding.

## 2014-05-11 ENCOUNTER — Encounter (HOSPITAL_BASED_OUTPATIENT_CLINIC_OR_DEPARTMENT_OTHER): Payer: Self-pay | Admitting: *Deleted

## 2014-05-11 NOTE — Progress Notes (Signed)
Pt is independent living [facility], but very forgetful-have talked with nurses there who are prepared to have her asisted living post op or family to stay with her post op-she gets around w/c and walker-takes her meds-talked with paul-so bringing her-reviewed preop instructions-will need istat-had ekg 05/13/13 exactly 1 yr ago Notes fro dr Tia Masker cardiology

## 2014-05-13 ENCOUNTER — Other Ambulatory Visit: Payer: Self-pay | Admitting: Physician Assistant

## 2014-05-14 ENCOUNTER — Encounter (HOSPITAL_BASED_OUTPATIENT_CLINIC_OR_DEPARTMENT_OTHER): Payer: Medicare Other | Admitting: Anesthesiology

## 2014-05-14 ENCOUNTER — Ambulatory Visit (HOSPITAL_BASED_OUTPATIENT_CLINIC_OR_DEPARTMENT_OTHER)
Admission: RE | Admit: 2014-05-14 | Discharge: 2014-05-14 | Disposition: A | Discharge status: Home / Self Care | Payer: Medicare Other | Source: Ambulatory Visit | Attending: Orthopedic Surgery | Admitting: Orthopedic Surgery

## 2014-05-14 ENCOUNTER — Ambulatory Visit (HOSPITAL_BASED_OUTPATIENT_CLINIC_OR_DEPARTMENT_OTHER): Payer: Medicare Other | Admitting: Anesthesiology

## 2014-05-14 ENCOUNTER — Encounter (HOSPITAL_BASED_OUTPATIENT_CLINIC_OR_DEPARTMENT_OTHER): Payer: Self-pay | Admitting: *Deleted

## 2014-05-14 ENCOUNTER — Encounter (HOSPITAL_BASED_OUTPATIENT_CLINIC_OR_DEPARTMENT_OTHER)
Admission: RE | Disposition: A | Discharge status: Home / Self Care | Payer: Self-pay | Source: Ambulatory Visit | Attending: Orthopedic Surgery

## 2014-05-14 DIAGNOSIS — Z7983 Long term (current) use of bisphosphonates: Secondary | ICD-10-CM | POA: Insufficient documentation

## 2014-05-14 DIAGNOSIS — I10 Essential (primary) hypertension: Secondary | ICD-10-CM | POA: Diagnosis not present

## 2014-05-14 DIAGNOSIS — Z7982 Long term (current) use of aspirin: Secondary | ICD-10-CM | POA: Diagnosis not present

## 2014-05-14 DIAGNOSIS — Z923 Personal history of irradiation: Secondary | ICD-10-CM | POA: Insufficient documentation

## 2014-05-14 DIAGNOSIS — M2040 Other hammer toe(s) (acquired), unspecified foot: Secondary | ICD-10-CM | POA: Insufficient documentation

## 2014-05-14 DIAGNOSIS — M205X9 Other deformities of toe(s) (acquired), unspecified foot: Secondary | ICD-10-CM | POA: Insufficient documentation

## 2014-05-14 DIAGNOSIS — M205X1 Other deformities of toe(s) (acquired), right foot: Secondary | ICD-10-CM

## 2014-05-14 DIAGNOSIS — Z79899 Other long term (current) drug therapy: Secondary | ICD-10-CM | POA: Diagnosis not present

## 2014-05-14 DIAGNOSIS — Z853 Personal history of malignant neoplasm of breast: Secondary | ICD-10-CM | POA: Diagnosis not present

## 2014-05-14 DIAGNOSIS — Z90710 Acquired absence of both cervix and uterus: Secondary | ICD-10-CM | POA: Diagnosis not present

## 2014-05-14 DIAGNOSIS — Z8673 Personal history of transient ischemic attack (TIA), and cerebral infarction without residual deficits: Secondary | ICD-10-CM | POA: Insufficient documentation

## 2014-05-14 DIAGNOSIS — Z8543 Personal history of malignant neoplasm of ovary: Secondary | ICD-10-CM | POA: Diagnosis not present

## 2014-05-14 HISTORY — DX: Cerebral infarction, unspecified: I63.9

## 2014-05-14 HISTORY — DX: Presence of spectacles and contact lenses: Z97.3

## 2014-05-14 HISTORY — PX: AMPUTATION: SHX166

## 2014-05-14 HISTORY — DX: Malignant (primary) neoplasm, unspecified: C80.1

## 2014-05-14 LAB — POCT I-STAT, CHEM 8
BUN: 17 mg/dL (ref 6–23)
Calcium, Ion: 1.18 mmol/L (ref 1.13–1.30)
Chloride: 101 mEq/L (ref 96–112)
Creatinine, Ser: 0.7 mg/dL (ref 0.50–1.10)
Glucose, Bld: 94 mg/dL (ref 70–99)
HCT: 40 % (ref 36.0–46.0)
HEMOGLOBIN: 13.6 g/dL (ref 12.0–15.0)
Potassium: 3.9 mEq/L (ref 3.7–5.3)
SODIUM: 136 meq/L — AB (ref 137–147)
TCO2: 25 mmol/L (ref 0–100)

## 2014-05-14 SURGERY — AMPUTATION, FOOT, RAY
Anesthesia: Monitor Anesthesia Care | Site: Toe | Laterality: Right

## 2014-05-14 MED ORDER — BACITRACIN ZINC 500 UNIT/GM EX OINT
TOPICAL_OINTMENT | CUTANEOUS | Status: DC | PRN
  Administered 2014-05-14: 1 via TOPICAL

## 2014-05-14 MED ORDER — 0.9 % SODIUM CHLORIDE (POUR BTL) OPTIME
TOPICAL | Status: DC | PRN
  Administered 2014-05-14: 200 mL

## 2014-05-14 MED ORDER — SODIUM CHLORIDE 0.9 % IV SOLN: INTRAVENOUS | Status: DC

## 2014-05-14 MED ORDER — BACITRACIN ZINC 500 UNIT/GM EX OINT
TOPICAL_OINTMENT | CUTANEOUS | Status: AC
  Filled 2014-05-14: qty 28.35

## 2014-05-14 MED ORDER — TRAMADOL HCL 50 MG PO TABS: 50.0000 mg | ORAL_TABLET | Freq: Four times a day (QID) | ORAL | Status: DC | PRN

## 2014-05-14 MED ORDER — CHLORHEXIDINE GLUCONATE 4 % EX LIQD: 60.0000 mL | Freq: Once | CUTANEOUS | Status: DC

## 2014-05-14 MED ORDER — LIDOCAINE HCL 2 % IJ SOLN
INTRAMUSCULAR | Status: DC | PRN
  Administered 2014-05-14: 10 mL

## 2014-05-14 MED ORDER — PROPOFOL 10 MG/ML IV EMUL
INTRAVENOUS | Status: DC | PRN
  Administered 2014-05-14: 75 ug/kg/min via INTRAVENOUS

## 2014-05-14 MED ORDER — ONDANSETRON HCL 4 MG/2ML IJ SOLN
INTRAMUSCULAR | Status: DC | PRN
  Administered 2014-05-14: 4 mg via INTRAVENOUS

## 2014-05-14 MED ORDER — ACETAMINOPHEN 500 MG PO TABS
ORAL_TABLET | ORAL | Status: AC
  Filled 2014-05-14: qty 2

## 2014-05-14 MED ORDER — DEXAMETHASONE SODIUM PHOSPHATE 4 MG/ML IJ SOLN
INTRAMUSCULAR | Status: DC | PRN
  Administered 2014-05-14: 4 mg via INTRAVENOUS

## 2014-05-14 MED ORDER — MIDAZOLAM HCL 2 MG/2ML IJ SOLN: 1.0000 mg | INTRAMUSCULAR | Status: DC | PRN

## 2014-05-14 MED ORDER — LIDOCAINE HCL 2 % IJ SOLN
INTRAMUSCULAR | Status: AC
Start: 2014-05-14 — End: 2014-05-14
  Filled 2014-05-14: qty 20

## 2014-05-14 MED ORDER — CEFAZOLIN SODIUM-DEXTROSE 2-3 GM-% IV SOLR
2.0000 g | INTRAVENOUS | Status: AC
  Administered 2014-05-14: 2 g via INTRAVENOUS

## 2014-05-14 MED ORDER — LIDOCAINE HCL (PF) 1 % IJ SOLN
INTRAMUSCULAR | Status: AC
  Filled 2014-05-14: qty 30

## 2014-05-14 MED ORDER — FENTANYL CITRATE 0.05 MG/ML IJ SOLN: 25.0000 ug | INTRAMUSCULAR | Status: DC | PRN

## 2014-05-14 MED ORDER — LACTATED RINGERS IV SOLN
INTRAVENOUS | Status: DC
  Administered 2014-05-14: 08:00:00 via INTRAVENOUS

## 2014-05-14 MED ORDER — FENTANYL CITRATE 0.05 MG/ML IJ SOLN
INTRAMUSCULAR | Status: AC
  Filled 2014-05-14: qty 6

## 2014-05-14 MED ORDER — BUPIVACAINE-EPINEPHRINE (PF) 0.5% -1:200000 IJ SOLN
INTRAMUSCULAR | Status: AC
  Filled 2014-05-14: qty 30

## 2014-05-14 MED ORDER — CEFAZOLIN SODIUM-DEXTROSE 2-3 GM-% IV SOLR
INTRAVENOUS | Status: AC
  Filled 2014-05-14: qty 50

## 2014-05-14 MED ORDER — ACETAMINOPHEN 500 MG PO TABS
1000.0000 mg | ORAL_TABLET | Freq: Once | ORAL | Status: AC
  Administered 2014-05-14: 1000 mg via ORAL

## 2014-05-14 MED ORDER — FENTANYL CITRATE 0.05 MG/ML IJ SOLN
50.0000 ug | INTRAMUSCULAR | Status: DC | PRN
  Administered 2014-05-14: 25 ug via INTRAVENOUS

## 2014-05-14 SURGICAL SUPPLY — 66 items
BLADE AVERAGE 25MMX9MM (BLADE)
BLADE AVERAGE 25X9 (BLADE) ×1 IMPLANT
BLADE OSC/SAG .038X5.5 CUT EDG (BLADE) IMPLANT
BLADE SURG 10 STRL SS (BLADE) ×2 IMPLANT
BLADE SURG 15 STRL LF DISP TIS (BLADE) ×1 IMPLANT
BLADE SURG 15 STRL SS (BLADE)
BNDG CMPR 9X4 STRL LF SNTH (GAUZE/BANDAGES/DRESSINGS) ×1
BNDG COHESIVE 4X5 TAN STRL (GAUZE/BANDAGES/DRESSINGS) ×3 IMPLANT
BNDG CONFORM 3 STRL LF (GAUZE/BANDAGES/DRESSINGS) IMPLANT
BNDG ESMARK 4X9 LF (GAUZE/BANDAGES/DRESSINGS) ×3 IMPLANT
CHLORAPREP W/TINT 26ML (MISCELLANEOUS) ×3 IMPLANT
CORDS BIPOLAR (ELECTRODE) IMPLANT
COVER TABLE BACK 60X90 (DRAPES) ×3 IMPLANT
DECANTER SPIKE VIAL GLASS SM (MISCELLANEOUS) IMPLANT
DRAPE EXTREMITY T 121X128X90 (DRAPE) ×3 IMPLANT
DRAPE OEC MINIVIEW 54X84 (DRAPES) IMPLANT
DRAPE SURG 17X23 STRL (DRAPES) ×3 IMPLANT
DRAPE U-SHAPE 47X51 STRL (DRAPES) IMPLANT
DRSG EMULSION OIL 3X3 NADH (GAUZE/BANDAGES/DRESSINGS) ×3 IMPLANT
DRSG PAD ABDOMINAL 8X10 ST (GAUZE/BANDAGES/DRESSINGS) IMPLANT
ELECT REM PT RETURN 9FT ADLT (ELECTROSURGICAL) ×3
ELECTRODE REM PT RTRN 9FT ADLT (ELECTROSURGICAL) ×1 IMPLANT
GAUZE SPONGE 4X4 12PLY STRL (GAUZE/BANDAGES/DRESSINGS) ×3 IMPLANT
GAUZE SPONGE 4X4 16PLY XRAY LF (GAUZE/BANDAGES/DRESSINGS) IMPLANT
GLOVE BIO SURGEON STRL SZ7 (GLOVE) ×1 IMPLANT
GLOVE BIO SURGEON STRL SZ8 (GLOVE) ×3 IMPLANT
GLOVE BIOGEL PI IND STRL 7.0 (GLOVE) ×1 IMPLANT
GLOVE BIOGEL PI IND STRL 8 (GLOVE) ×1 IMPLANT
GLOVE BIOGEL PI INDICATOR 7.0 (GLOVE) ×2
GLOVE BIOGEL PI INDICATOR 8 (GLOVE) ×2
GLOVE ECLIPSE 6.5 STRL STRAW (GLOVE) ×2 IMPLANT
GLOVE EXAM NITRILE MD LF STRL (GLOVE) IMPLANT
GOWN STRL REUS W/ TWL LRG LVL3 (GOWN DISPOSABLE) ×2 IMPLANT
GOWN STRL REUS W/ TWL XL LVL3 (GOWN DISPOSABLE) ×1 IMPLANT
GOWN STRL REUS W/TWL LRG LVL3 (GOWN DISPOSABLE) ×3
GOWN STRL REUS W/TWL XL LVL3 (GOWN DISPOSABLE) ×3
NDL HYPO 25X1 1.5 SAFETY (NEEDLE) IMPLANT
NDL SAFETY ECLIPSE 18X1.5 (NEEDLE) IMPLANT
NEEDLE HYPO 18GX1.5 SHARP (NEEDLE)
NEEDLE HYPO 25X1 1.5 SAFETY (NEEDLE) ×3 IMPLANT
NS IRRIG 1000ML POUR BTL (IV SOLUTION) ×3 IMPLANT
PACK BASIN DAY SURGERY FS (CUSTOM PROCEDURE TRAY) ×3 IMPLANT
PAD CAST 4YDX4 CTTN HI CHSV (CAST SUPPLIES) ×1 IMPLANT
PADDING CAST ABS 4INX4YD NS (CAST SUPPLIES)
PADDING CAST ABS COTTON 4X4 ST (CAST SUPPLIES) IMPLANT
PADDING CAST COTTON 4X4 STRL (CAST SUPPLIES) ×3
PENCIL BUTTON HOLSTER BLD 10FT (ELECTRODE) ×2 IMPLANT
SANITIZER HAND PURELL 535ML FO (MISCELLANEOUS) ×3 IMPLANT
SHEET MEDIUM DRAPE 40X70 STRL (DRAPES) ×3 IMPLANT
SLEEVE SCD COMPRESS KNEE MED (MISCELLANEOUS) ×1 IMPLANT
SPONGE LAP 18X18 X RAY DECT (DISPOSABLE) ×3 IMPLANT
STOCKINETTE 6  STRL (DRAPES) ×2
STOCKINETTE 6 STRL (DRAPES) ×1 IMPLANT
SUCTION FRAZIER TIP 10 FR DISP (SUCTIONS) IMPLANT
SUT ETHILON 2 0 FS 18 (SUTURE) ×2 IMPLANT
SUT ETHILON 2 0 FSLX (SUTURE) IMPLANT
SUT ETHILON 3 0 PS 1 (SUTURE) IMPLANT
SUT MNCRL AB 3-0 PS2 18 (SUTURE) IMPLANT
SWAB COLLECTION DEVICE MRSA (MISCELLANEOUS) IMPLANT
SYR BULB 3OZ (MISCELLANEOUS) ×3 IMPLANT
SYR CONTROL 10ML LL (SYRINGE) ×2 IMPLANT
TOWEL OR 17X24 6PK STRL BLUE (TOWEL DISPOSABLE) ×3 IMPLANT
TOWEL OR NON WOVEN STRL DISP B (DISPOSABLE) ×1 IMPLANT
TUBE CONNECTING 20'X1/4 (TUBING)
TUBE CONNECTING 20X1/4 (TUBING) IMPLANT
UNDERPAD 30X30 INCONTINENT (UNDERPADS AND DIAPERS) ×3 IMPLANT

## 2014-05-14 NOTE — Anesthesia Postprocedure Evaluation (Signed)
  Anesthesia Post-op Note  Patient: Alexa Rowe  Procedure(s) Performed: Procedure(s): RIGHT THIRD TOE AMPUTATION  (Right)  Patient Location: PACU  Anesthesia Type: MAC  Level of Consciousness: awake and alert   Airway and Oxygen Therapy: Patient Spontanous Breathing  Post-op Pain: none  Post-op Assessment: Post-op Vital signs reviewed, Patient's Cardiovascular Status Stable and Respiratory Function Stable  Post-op Vital Signs: Reviewed  Filed Vitals:   05/14/14 0945  BP: 170/77  Pulse: 55  Temp:   Resp: 21    Complications: No apparent anesthesia complications

## 2014-05-14 NOTE — Brief Op Note (Signed)
05/14/2014  8:56 AM  PATIENT:  Alexa Rowe  80 y.o. female  PRE-OPERATIVE DIAGNOSIS:  RIGHT THIRD CROSS OVER TOE  POST-OPERATIVE DIAGNOSIS:  RIGHT THIRD CROSS OVER TOE  Procedure(s): RIGHT THIRD TOE AMPUTATION through the MTP joint  SURGEON:  Wylene Simmer, MD  ASSISTANT: n/a  ANESTHESIA:   Local, MAC  EBL:  minimal   TOURNIQUET:   Total Tourniquet Time Documented: Leg (Right) - 7 minutes Total: Leg (Right) - 7 minutes   COMPLICATIONS:  None apparent  DISPOSITION:  Extubated, awake and stable to recovery.  DICTATION ID:  [id number]

## 2014-05-14 NOTE — Transfer of Care (Signed)
Immediate Anesthesia Transfer of Care Note  Patient: Alexa Rowe  Procedure(s) Performed: Procedure(s) (LRB): RIGHT THIRD TOE AMPUTATION  (Right)  Patient Location: PACU  Anesthesia Type: MAC  Level of Consciousness: awake, alert , oriented and patient cooperative  Airway & Oxygen Therapy: Patient Spontanous Breathing and Patient connected to face mask oxygen  Post-op Assessment: Report given to PACU RN and Post -op Vital signs reviewed and stable  Post vital signs: Reviewed and stable  Complications: No apparent anesthesia complications

## 2014-05-14 NOTE — Anesthesia Preprocedure Evaluation (Signed)
Anesthesia Evaluation  Patient identified by MRN, date of birth, ID band Patient awake    Reviewed: Allergy & Precautions, H&P , NPO status , Patient's Chart, lab work & pertinent test results, reviewed documented beta blocker date and time   Airway Mallampati: II TM Distance: >3 FB Neck ROM: Full    Dental no notable dental hx. (+) Teeth Intact, Dental Advisory Given   Pulmonary neg pulmonary ROS,  breath sounds clear to auscultation  Pulmonary exam normal       Cardiovascular hypertension, Pt. on medications and Pt. on home beta blockers Rhythm:Regular Rate:Normal     Neuro/Psych TIACVA, No Residual Symptoms negative psych ROS   GI/Hepatic Neg liver ROS,   Endo/Other  negative endocrine ROS  Renal/GU negative Renal ROS  negative genitourinary   Musculoskeletal   Abdominal   Peds  Hematology negative hematology ROS (+)   Anesthesia Other Findings   Reproductive/Obstetrics negative OB ROS                           Anesthesia Physical Anesthesia Plan  ASA: II  Anesthesia Plan: MAC   Post-op Pain Management:    Induction: Intravenous  Airway Management Planned: Simple Face Mask  Additional Equipment:   Intra-op Plan:   Post-operative Plan:   Informed Consent: I have reviewed the patients History and Physical, chart, labs and discussed the procedure including the risks, benefits and alternatives for the proposed anesthesia with the patient or authorized representative who has indicated his/her understanding and acceptance.   Dental advisory given  Plan Discussed with: CRNA  Anesthesia Plan Comments:         Anesthesia Quick Evaluation

## 2014-05-14 NOTE — Op Note (Signed)
Alexa Rowe, Alexa Rowe                 ACCOUNT NO.:  [id number]  MEDICAL RECORD NO.:  [id number]  LOCATION:                                 FACILITY:  PHYSICIAN:  Wylene Simmer, MD        DATE OF BIRTH:  1918-06-09  DATE OF PROCEDURE:  05/14/2014 DATE OF DISCHARGE:                              OPERATIVE REPORT   PREOPERATIVE DIAGNOSIS:  Right third crossover toe/hammertoe deformity.  POSTOPERATIVE DIAGNOSIS:  Right third crossover toe/hammertoe deformity.  PROCEDURE:  Right third toe amputation through the MTP joint.  SURGEON:  Wylene Simmer, MD.  ANESTHESIA:  Local, MAC.  ESTIMATED BLOOD LOSS:  Minimal.  TOURNIQUET TIME:  7 minutes with an ankle Esmarch.  COMPLICATIONS:  None apparent.  DISPOSITION:  Extubated awake and stable to recovery.  INDICATIONS FOR PROCEDURE:  The patient is a 80 year old woman with a history of a bunion deformity and third hammertoe deformity.  The third hammertoe has progressed to a crossover toe deformity with the hallux. The patient is status post second toe amputation for similar problem in the remote past.  She presents now for amputation of the third toe.  She understands the risks and benefits, the alternative treatment options, and elects surgical treatment.  She specifically understands risks of bleeding, infection, nerve damage, blood clots, need for additional surgery, continued pain, revision amputation, and death.  PROCEDURE IN DETAIL:  After preoperative consent was obtained, the correct operative site was identified.  The patient was brought to the operating room and placed supine on the operating table.  IV sedation was administered.  A metatarsal block was performed after a surgical time-out.  The right lower extremity was then prepped and draped in standard sterile fashion.  An Esmarch bandage was used to exsanguinate the foot and was wrapped around the ankle as a tourniquet.  A racquet style incision was made around the base of the  third toe.  The toe was disarticulated through the MTP joint, passed off the field.  The neurovascular bundles were cauterized.  The tourniquet was released at 7 minutes.  Hemostasis was achieved.  Wound was irrigated copiously. Horizontal mattress sutures of 2-0 nylon were used to close the skin incision.  Sterile dressings were applied followed by a compressive wrap.  The patient was awakened from anesthesia and transported to the recovery room in stable condition.  FOLLOWUP PLAN:  The patient will be weightbearing as tolerated on the right foot in a flat postop shoe.  She will follow up with me in 2 weeks for suture removal.     Wylene Simmer, MD     JH/MEDQ  D:  05/14/2014  T:  05/14/2014  Job:  [id number]

## 2014-05-14 NOTE — Discharge Instructions (Addendum)
Alexa Simmer, MD [place]  Please read the following information regarding your care after surgery.  Medications  You only need a prescription for the narcotic pain medicine (ex. oxycodone, Percocet, Norco).  All of the other medicines listed below are available over the counter. X acetominophen (Tylenol) 650 mg every 4-6 hours as you need for minor pain X tramadol as prescribed for moderate to severe pain   Weight Bearing X Bear weight when you are able on your operated leg or foot in the post-op shoe.  Cast / Splint / Dressing X Keep your dressing clean and dry.  Dont put anything (coat hanger, pencil, etc) down inside of it.  If it gets damp, use a hair dryer on the cool setting to dry it.  If it gets soaked, call the office to schedule an appointment for a dressing change.  After your dressing, cast or splint is removed; you may shower, but do not soak or scrub the wound.  Allow the water to run over it, and then gently pat it dry.  Swelling It is normal for you to have swelling where you had surgery.  To reduce swelling and pain, keep your toes above your nose for at least 3 days after surgery.  It may be necessary to keep your foot or leg elevated for several weeks.  If it hurts, it should be elevated.  Follow Up Call my office at [phone] when you are discharged from the hospital or surgery center to schedule an appointment to be seen two weeks after surgery.  Call my office at [phone] if you develop a fever >101.5 F, nausea, vomiting, bleeding from the surgical site or severe pain.     Post Anesthesia Home Care Instructions  Activity: Get plenty of rest for the remainder of the day. A responsible adult should stay with you for 24 hours following the procedure.  For the next 24 hours, DO NOT: -Drive a car -Paediatric nurse -Drink alcoholic beverages -Take any medication unless instructed by your physician -Make any legal decisions or sign important  papers.  Meals: Start with liquid foods such as gelatin or soup. Progress to regular foods as tolerated. Avoid greasy, spicy, heavy foods. If nausea and/or vomiting occur, drink only clear liquids until the nausea and/or vomiting subsides. Call your physician if vomiting continues.  Special Instructions/Symptoms: Your throat may feel dry or sore from the anesthesia or the breathing tube placed in your throat during surgery. If this causes discomfort, gargle with warm salt water. The discomfort should disappear within 24 hours.

## 2014-05-14 NOTE — H&P (Signed)
Alexa Rowe is an 80 y.o. female.   Chief Complaint:  Right crossover toe HPI: 80 y/o female with painful right 3rd crossover toe and hallux valgus deformities.  She desires elective amputation of the third toe to treat this painful condition.  Past Medical History  Diagnosis Date  . Hypertension   . Arthritis   . TIA (transient ischemic attack)   . Cognitive decline   . UTI (lower urinary tract infection)   . Debility   . GERD (gastroesophageal reflux disease)   . Wears glasses   . Cancer     ovarian,breast  . Stroke 1996    TIA 1996    Past Surgical History  Procedure Laterality Date  . Toe amputation Bilateral 05/26/12    bilateral second toe amputations  . Knee arthroscopy      left  . Abdominal hysterectomy  1989    tah/bso-cancer-radiation  . Breast lumpectomy      left  . Eye surgery      both cataracts    History reviewed. No pertinent family history. Social History:  reports that she has never smoked. She does not have any smokeless tobacco history on file. She reports that she does not drink alcohol or use illicit drugs.  Allergies: No Known Allergies  Medications Prior to Admission  Medication Sig Dispense Refill  . acetaminophen (TYLENOL) 325 MG tablet Take 2 tablets (650 mg total) by mouth every 6 (six) hours as needed for pain or fever.      Marland Kitchen aspirin EC 81 MG EC tablet Take 1 tablet (81 mg total) by mouth daily.      . Cholecalciferol (HM VITAMIN D3) 2000 UNITS CAPS Take 1 capsule by mouth daily.       . COD LIVER OIL PO Take 1 tablet by mouth daily.       Marland Kitchen donepezil (ARICEPT) 5 MG tablet Take 5 mg by mouth at bedtime.      . feeding supplement (ENSURE COMPLETE) LIQD Take 237 mLs by mouth daily.      Marland Kitchen gabapentin (NEURONTIN) 100 MG capsule Take 100 mg by mouth at bedtime.      . metoprolol (LOPRESSOR) 50 MG tablet Take 50 mg by mouth 2 (two) times daily.      Marland Kitchen senna-docusate (SENOKOT-S) 8.6-50 MG per tablet Take 1 tablet by mouth at bedtime.      .  simvastatin (ZOCOR) 40 MG tablet Take 40 mg by mouth daily.      . verapamil (VERELAN PM) 180 MG 24 hr capsule Take 180 mg by mouth daily.      Marland Kitchen alendronate (FOSAMAX) 70 MG tablet Take 70 mg by mouth once a week. Take with a full glass of water on an empty stomach.        No results found for this or any previous visit (from the past 48 hour(s)). No results found.  ROS  No recent f/c/n/v/wt loss.  Blood pressure 173/87, pulse 66, temperature 97.8 F (36.6 C), temperature source Oral, resp. rate 20, height 5\' 4"  (1.626 m), weight 54.885 kg (121 lb), SpO2 96.00%. Physical Exam  Elderly wn wd woman in nad.  A and Ox 4.  Mood and affect normal.  EOMI.  resp unlabored.  R foot with 3rd hammertoe and hallux valgus.  The 3rd toe deviates medially and crosses over the hallux.  No significant ulcers.  No signs of infection.  5/5 strength in PF and DF of the ankle.  No lymphadenopathy.  1+ dp and pt pulses.  Assessment/Plan R 3rd crossover toe deformity - to OR for amputation of the R 3rd toe through the MTP joint.  The risks and benefits of the alternative treatment options have been discussed in detail.  The patient wishes to proceed with surgery and specifically understands risks of bleeding, infection, nerve damage, blood clots, need for additional surgery, amputation and death.   Wylene Simmer 05/23/14, 8:08 AM

## 2014-05-15 ENCOUNTER — Encounter (HOSPITAL_BASED_OUTPATIENT_CLINIC_OR_DEPARTMENT_OTHER): Payer: Self-pay | Admitting: Orthopedic Surgery

## 2014-08-24 ENCOUNTER — Encounter: Payer: Self-pay | Admitting: Adult Health

## 2014-08-25 ENCOUNTER — Non-Acute Institutional Stay (SKILLED_NURSING_FACILITY): Payer: Medicare Other | Admitting: Adult Health

## 2014-08-25 ENCOUNTER — Encounter: Payer: Self-pay | Admitting: Adult Health

## 2014-08-25 DIAGNOSIS — I10 Essential (primary) hypertension: Secondary | ICD-10-CM

## 2014-08-25 NOTE — Progress Notes (Signed)
Patient ID: Alexa Rowe, female   DOB: 10/11/1917, 80 y.o.   MRN: [id number]  Nursing Home Location:  [facility]   Code Status: DNR   Place of Service: SNF (31)  Chief Complaint  Patient presents with  . Acute Visit    right toe pain, callus    HPI: 80 y.o.  Female residing at [facility], rehab section. I was asked to see her today due to some redness and pain to the right great toe. There is also a callus to the bottom of the right great toe. There are no reports of fever or drainage to the toe. She has a hx of complicated foot issues with hallux valgus and toe amputations. She denies pain at the time of the visit. An xray was done to the right foot on 08/22/14 showing on OP, OA, amputations, but no fracture or acute process. Of note she was treated for a skin infection to a small wound to the left shin with Cipro for 7 days. The culture showed sensitive staph and enterobacter, the staff reports that this is better. She was fitted by [employer] today for a supportive shoe.   Review of Systems:  Review of Systems  Constitutional: Negative for fever, chills, diaphoresis, activity change and appetite change.  Respiratory: Negative for cough and shortness of breath.   Cardiovascular: Negative for chest pain, palpitations and leg swelling.  Gastrointestinal: Positive for constipation. Negative for abdominal pain and abdominal distention.  Musculoskeletal: Positive for arthralgias.       Foot pain and arthritis, toe amputations    Medications: Patient's Medications  New Prescriptions   No medications on file  Previous Medications   ACETAMINOPHEN (TYLENOL) 325 MG TABLET    Take 2 tablets (650 mg total) by mouth every 6 (six) hours as needed for pain or fever.   ALENDRONATE (FOSAMAX) 70 MG TABLET    Take 70 mg by mouth once a week. Take with a full glass of water on an empty stomach.   ASPIRIN EC 81 MG EC TABLET    Take 1 tablet (81 mg total)  by mouth daily.   CHOLECALCIFEROL (HM VITAMIN D3) 2000 UNITS CAPS    Take 1 capsule by mouth daily.    COD LIVER OIL PO    Take 1 tablet by mouth daily.    DONEPEZIL (ARICEPT) 5 MG TABLET    Take 5 mg by mouth at bedtime.   FEEDING SUPPLEMENT (ENSURE COMPLETE) LIQD    Take 237 mLs by mouth daily.   GABAPENTIN (NEURONTIN) 100 MG CAPSULE    Take 100 mg by mouth at bedtime.   METOPROLOL (LOPRESSOR) 50 MG TABLET    Take 50 mg by mouth 2 (two) times daily.   SENNA-DOCUSATE (SENOKOT-S) 8.6-50 MG PER TABLET    Take 1 tablet by mouth at bedtime.   SIMVASTATIN (ZOCOR) 40 MG TABLET    Take 40 mg by mouth daily.   TRAMADOL (ULTRAM) 50 MG TABLET    Take 1 tablet (50 mg total) by mouth every 6 (six) hours as needed for moderate pain.   VERAPAMIL (VERELAN PM) 180 MG 24 HR CAPSULE    Take 180 mg by mouth daily.  Modified Medications   No medications on file  Discontinued Medications   No medications on file     Physical Exam:  Filed Vitals:   08/24/14 1506  BP: 150/74  Pulse: 70  Temp: 97.1 F (36.2 C)  Resp: 20  Physical Exam  Cardiovascular: Normal rate and intact distal pulses.  An irregularly irregular rhythm present.  No murmur heard. Venous stasis changes to BLE. Trace edema bilat  Pulmonary/Chest: Effort normal and breath sounds normal. No respiratory distress.  Abdominal: Soft. Bowel sounds are normal. She exhibits no distension.  Musculoskeletal: She exhibits tenderness. She exhibits no edema.  Tenderness to the right foot and right great toe. Hallux valgus noted on the right.  Amputation of 3rd and 4th toes on the right  Skin: Skin is warm and dry. There is erythema.  Callus noted to the plantar aspect of the right great toe. No redness or drainage to the area. Some erythema to the top of the right great toe but no edema or warmth    Labs reviewed/Significant Diagnostic Results:  Basic Metabolic Panel:  Recent Labs  05/14/14 0804  NA 136*  K 3.9  CL 101  GLUCOSE 94    BUN 17  CREATININE 0.70   Liver Function Tests: No results for input(s): AST, ALT, ALKPHOS, BILITOT, PROT, ALBUMIN in the last 8760 hours. No results for input(s): LIPASE, AMYLASE in the last 8760 hours. No results for input(s): AMMONIA in the last 8760 hours. CBC:  Recent Labs  05/14/14 0804  HGB 13.6  HCT 40.0   CBG: No results for input(s): GLUCAP in the last 8760 hours. TSH: No results for input(s): TSH in the last 8760 hours. A1C: Lab Results  Component Value Date   HGBA1C 5.7* 07/01/2012       Assessment/Plan  No problem-specific assessment & plan notes found for this encounter.     Cindi Carbon, ANP/Arthur Nyoka Cowden M.D. [place] [phone]   This encounter was created in error - please disregard.

## 2014-08-26 ENCOUNTER — Encounter: Payer: Self-pay | Admitting: Adult Health

## 2014-08-26 NOTE — Assessment & Plan Note (Signed)
Hctz 12.5mg  p.o. Daily. Check BMP next week

## 2014-08-26 NOTE — Progress Notes (Signed)
Patient ID: KAYTLYNN KOCHAN, female   DOB: 11-23-1917, 80 y.o.   MRN: [id number]  Nursing Home Location:       Place of Service:    Chief Complaint  Patient presents with  . Acute Visit    htn    HPI:  80 y.o. female residing at [facility], rehab section. I was asked to see her today due to elevated BP. Her BP has ranged 140-187/73-93. She denies SOB, CP, or headache. She has not had increased swelling. She does have some pain her left foot but she describes this as minimal.    Review of Systems:  Review of Systems  Constitutional: Negative for chills, diaphoresis, activity change and appetite change.  Respiratory: Negative for cough, shortness of breath and wheezing.   Cardiovascular: Negative for chest pain, palpitations and leg swelling.  Gastrointestinal: Positive for constipation. Negative for abdominal distention.  Neurological: Negative for dizziness, syncope, speech difficulty, weakness and numbness.    Medications: Patient's Medications  New Prescriptions   No medications on file  Previous Medications   ACETAMINOPHEN (TYLENOL) 325 MG TABLET    Take 2 tablets (650 mg total) by mouth every 6 (six) hours as needed for pain or fever.   ALENDRONATE (FOSAMAX) 70 MG TABLET    Take 70 mg by mouth once a week. Take with a full glass of water on an empty stomach.   ASPIRIN EC 81 MG EC TABLET    Take 1 tablet (81 mg total) by mouth daily.   CHOLECALCIFEROL (HM VITAMIN D3) 2000 UNITS CAPS    Take 1 capsule by mouth daily.    COD LIVER OIL PO    Take 1 tablet by mouth daily.    DONEPEZIL (ARICEPT) 5 MG TABLET    Take 5 mg by mouth at bedtime.   FEEDING SUPPLEMENT (ENSURE COMPLETE) LIQD    Take 237 mLs by mouth daily.   GABAPENTIN (NEURONTIN) 100 MG CAPSULE    Take 100 mg by mouth at bedtime.   METOPROLOL (LOPRESSOR) 50 MG TABLET    Take 50 mg by mouth 2 (two) times daily.   SENNA-DOCUSATE (SENOKOT-S) 8.6-50 MG PER TABLET    Take 1 tablet by mouth at bedtime.    SIMVASTATIN (ZOCOR) 40 MG TABLET    Take 40 mg by mouth daily.   TRAMADOL (ULTRAM) 50 MG TABLET    Take 1 tablet (50 mg total) by mouth every 6 (six) hours as needed for moderate pain.   VERAPAMIL (VERELAN PM) 180 MG 24 HR CAPSULE    Take 180 mg by mouth daily.  Modified Medications   No medications on file  Discontinued Medications   No medications on file     Physical Exam:  Filed Vitals:   08/26/14 1135  BP: 160/93  Pulse: 70  Resp: 20    Physical Exam  Cardiovascular: Normal rate and intact distal pulses.  An irregularly irregular rhythm present.  No murmur heard. Venous stasis changes to BLE. Trace edema bilat  Pulmonary/Chest: Effort normal and breath sounds normal. No respiratory distress.  Abdominal: Soft. Bowel sounds are normal. She exhibits no distension.  Musculoskeletal: She exhibits tenderness. She exhibits no edema.  Tenderness to the right foot and right great toe. Hallux valgus noted on the right.  Amputation of 3rd and 4th toes on the right  Skin: Skin is warm and dry. There is erythema.  Callus noted to the plantar aspect of the right great toe. No redness or  drainage to the area. Some erythema to the top of the right great toe but no edema or warmth    Labs reviewed/Significant Diagnostic Results: 2D echo 06/30/12 Study Conclusions  - Left ventricle: The cavity size was normal. There was mild   concentric hypertrophy. Systolic function was normal. The   estimated ejection fraction was in the range of 55% to   60%. Wall motion was normal; there were no regional wall   motion abnormalities. - Aortic valve: Mildly to moderately calcified annulus.   Mildly calcified leaflets. Mild regurgitation. - Mitral valve: Calcified annulus. - Atrial septum: There was an atrial septal aneurysm.  Basic Metabolic Panel:  Recent Labs  05/14/14 0804  NA 136*  K 3.9  CL 101  GLUCOSE 94  BUN 17  CREATININE 0.70   Liver Function Tests: No results for input(s):  AST, ALT, ALKPHOS, BILITOT, PROT, ALBUMIN in the last 8760 hours. No results for input(s): LIPASE, AMYLASE in the last 8760 hours. No results for input(s): AMMONIA in the last 8760 hours. CBC:  Recent Labs  05/14/14 0804  HGB 13.6  HCT 40.0   CBG: No results for input(s): GLUCAP in the last 8760 hours. TSH: No results for input(s): TSH in the last 8760 hours. A1C: Lab Results  Component Value Date   HGBA1C 5.7* 07/01/2012    Assessment/Plan  Essential hypertension Hctz 12.5mg  p.o. Daily. Check BMP next week   Cindi Carbon, [place] [phone]

## 2014-09-01 LAB — BASIC METABOLIC PANEL
BUN: 15 mg/dL (ref 4–21)
Creatinine: 0.7 mg/dL (ref 0.5–1.1)
GLUCOSE: 79 mg/dL
Potassium: 4.1 mmol/L (ref 3.4–5.3)
SODIUM: 137 mmol/L (ref 137–147)

## 2014-09-30 ENCOUNTER — Encounter: Payer: Self-pay | Admitting: Nurse Practitioner

## 2014-09-30 ENCOUNTER — Non-Acute Institutional Stay: Payer: Medicare Other | Admitting: Nurse Practitioner

## 2014-09-30 VITALS — BP 130/64 | HR 68 | Wt 123.0 lb

## 2014-09-30 DIAGNOSIS — H9190 Unspecified hearing loss, unspecified ear: Secondary | ICD-10-CM

## 2014-09-30 NOTE — Progress Notes (Signed)
Patient ID: Alexa Rowe, female   DOB: [birth year], 81 y.o.   MRN: [id number]  Nursing Home Location:  [place] of Service: Clinic (12)  Chief Complaint  Patient presents with  . Hearing Loss    decrease in hearing. Need ears check for wax, then referral to Audiology    HPI:  81 y.o. female residing at [facility], [place] section seen today in clinic for decrease hearing and possible hearing assessment. Son with pt and reports that in the past she has had a cerumen impaction.   Review of Systems:  Review of Systems  Constitutional: Negative for appetite change and unexpected weight change.  HENT: Positive for hearing loss. Negative for congestion, ear discharge, ear pain and tinnitus.     Medications: Patient's Medications  New Prescriptions   No medications on file  Previous Medications   ACETAMINOPHEN (TYLENOL) 325 MG TABLET    Take 2 tablets (650 mg total) by mouth every 6 (six) hours as needed for pain or fever.   ALENDRONATE (FOSAMAX) 70 MG TABLET    Take 70 mg by mouth once a week. Take with a full glass of water on an empty stomach.   ASPIRIN EC 81 MG EC TABLET    Take 1 tablet (81 mg total) by mouth daily.   CHOLECALCIFEROL (HM VITAMIN D3) 2000 UNITS CAPS    Take 1 capsule by mouth daily.    COD LIVER OIL PO    Take 1 tablet by mouth daily.    DONEPEZIL (ARICEPT) 5 MG TABLET    Take 5 mg by mouth at bedtime.   FEEDING SUPPLEMENT (ENSURE COMPLETE) LIQD    Take 237 mLs by mouth daily.   GABAPENTIN (NEURONTIN) 100 MG CAPSULE    Take 100 mg by mouth at bedtime.   HYDROCHLOROTHIAZIDE (HYDRODIURIL) 12.5 MG TABLET    Take 12.5 mg by mouth daily.   METOPROLOL (LOPRESSOR) 50 MG TABLET    Take 50 mg by mouth. Take one tablet daily   SENNA-DOCUSATE (SENOKOT-S) 8.6-50 MG PER TABLET    Take 1 tablet by mouth at bedtime.   SIMVASTATIN (ZOCOR) 40 MG TABLET    Take 40 mg by mouth daily.   VERAPAMIL (VERELAN PM) 180 MG 24 HR CAPSULE     Take 180 mg by mouth daily.  Modified Medications   No medications on file  Discontinued Medications   TRAMADOL (ULTRAM) 50 MG TABLET    Take 1 tablet (50 mg total) by mouth every 6 (six) hours as needed for moderate pain.     Physical Exam:  Filed Vitals:   09/30/14 1308  BP: 130/64  Pulse: 68  Weight: 123 lb (55.792 kg)    Physical Exam  Constitutional: She is well-developed, well-nourished, and in no distress.  HENT:  Head: Normocephalic and atraumatic.  Right Ear: Tympanic membrane, external ear and ear canal normal.  Left Ear: Tympanic membrane, external ear and ear canal normal.  Nose: Nose normal.    Labs reviewed/Significant Diagnostic Results: 2D echo 06/30/12 Study Conclusions  - Left ventricle: The cavity size was normal. There was mild   concentric hypertrophy. Systolic function was normal. The   estimated ejection fraction was in the range of 55% to   60%. Wall motion was normal; there were no regional wall   motion abnormalities. - Aortic valve: Mildly to moderately calcified annulus.   Mildly calcified leaflets. Mild regurgitation. - Mitral valve: Calcified annulus. - Atrial  septum: There was an atrial septal aneurysm.  Basic Metabolic Panel:  Recent Labs  05/14/14 0804 09/01/14  NA 136* 137  K 3.9 4.1  CL 101  --   GLUCOSE 94  --   BUN 17 15  CREATININE 0.70 0.7   Liver Function Tests: No results for input(s): AST, ALT, ALKPHOS, BILITOT, PROT, ALBUMIN in the last 8760 hours. No results for input(s): LIPASE, AMYLASE in the last 8760 hours. No results for input(s): AMMONIA in the last 8760 hours. CBC:  Recent Labs  05/14/14 0804  HGB 13.6  HCT 40.0   CBG: No results for input(s): GLUCAP in the last 8760 hours. TSH: No results for input(s): TSH in the last 8760 hours. A1C: Lab Results  Component Value Date   HGBA1C 5.7* 07/01/2012    Assessment/Plan  1. Hearing loss, unspecified laterality No cerumen impaction noted. Minimal  wax in bilateral ear canal. Will refer to audiologist for hearing assessment

## 2014-10-07 ENCOUNTER — Encounter: Payer: Self-pay | Admitting: Nurse Practitioner

## 2014-10-07 ENCOUNTER — Non-Acute Institutional Stay: Payer: Medicare Other | Admitting: Nurse Practitioner

## 2014-10-07 VITALS — BP 124/70 | HR 72 | Temp 97.9°F | Ht 62.5 in | Wt 123.0 lb

## 2014-10-07 DIAGNOSIS — M1990 Unspecified osteoarthritis, unspecified site: Secondary | ICD-10-CM

## 2014-10-07 DIAGNOSIS — M810 Age-related osteoporosis without current pathological fracture: Secondary | ICD-10-CM

## 2014-10-07 DIAGNOSIS — Z418 Encounter for other procedures for purposes other than remedying health state: Secondary | ICD-10-CM

## 2014-10-07 DIAGNOSIS — I10 Essential (primary) hypertension: Secondary | ICD-10-CM

## 2014-10-07 DIAGNOSIS — E785 Hyperlipidemia, unspecified: Secondary | ICD-10-CM

## 2014-10-07 DIAGNOSIS — I639 Cerebral infarction, unspecified: Secondary | ICD-10-CM

## 2014-10-07 DIAGNOSIS — Z299 Encounter for prophylactic measures, unspecified: Secondary | ICD-10-CM

## 2014-10-07 DIAGNOSIS — K5900 Constipation, unspecified: Secondary | ICD-10-CM

## 2014-10-07 DIAGNOSIS — F0390 Unspecified dementia without behavioral disturbance: Secondary | ICD-10-CM

## 2014-10-07 NOTE — Progress Notes (Signed)
Patient ID: Alexa Rowe, female   DOB: 03-26-1918, 81 y.o.   MRN: [id number]    Patient ID: Alexa Rowe, female   DOB: 12/24/1917, 81 y.o.   MRN: [id number]  Nursing Home Location:  [place] of Service: Clinic (12)  Chief Complaint  Patient presents with  . Medical Management of Chronic Issues    New Patient -here with son Eddie Dibbles.  Per AL nurse family request low dose anti anxiety tablet in morning    HPI:  81 y.o. female residing at [facility], [place] section seen today in clinic to establish care. Pt recently moved to [place] after husband passed away last month Medications reviewed, takes multiple supplements. Reports good appetite.  Son would like to discuss aricept and neurontin   Torn posterior ligament in her right leg. Getting new leg brace to hopefully help with mobility Right toe blister that is slow to heal- uses WC Review of Systems:  Review of Systems  Constitutional: Negative for activity change, appetite change, fatigue and unexpected weight change.  HENT: Positive for hearing loss. Negative for congestion, ear discharge, ear pain and tinnitus.   Eyes:       Uses corrective lens  Respiratory: Negative for cough and shortness of breath.   Cardiovascular: Negative for chest pain, palpitations and leg swelling.  Gastrointestinal: Negative for diarrhea and constipation.  Genitourinary: Negative for difficulty urinating.  Neurological: Negative for dizziness.  Psychiatric/Behavioral: Positive for confusion. The patient is nervous/anxious.        Son reporting increased anxiety since her husbands death    Medications: Patient's Medications  New Prescriptions   No medications on file  Previous Medications   ACETAMINOPHEN (TYLENOL) 325 MG TABLET    Take 2 tablets (650 mg total) by mouth every 6 (six) hours as needed for pain or fever.   ALENDRONATE (FOSAMAX) 70 MG TABLET    Take 70 mg by mouth once a week. Take with a full  glass of water on an empty stomach.   ASPIRIN EC 81 MG EC TABLET    Take 1 tablet (81 mg total) by mouth daily.   CHOLECALCIFEROL (HM VITAMIN D3) 2000 UNITS CAPS    Take 1 capsule by mouth daily.    COD LIVER OIL PO    Take 1 tablet by mouth daily.    DONEPEZIL (ARICEPT) 5 MG TABLET    Take 5 mg by mouth at bedtime.   FEEDING SUPPLEMENT (ENSURE COMPLETE) LIQD    Take 237 mLs by mouth daily.   GABAPENTIN (NEURONTIN) 100 MG CAPSULE    Take 100 mg by mouth at bedtime.   HYDROCHLOROTHIAZIDE (HYDRODIURIL) 12.5 MG TABLET    Take 12.5 mg by mouth daily.   METOPROLOL (LOPRESSOR) 50 MG TABLET    Take 50 mg by mouth. Take one tablet daily   SENNA-DOCUSATE (SENOKOT-S) 8.6-50 MG PER TABLET    Take 1 tablet by mouth at bedtime.   SIMVASTATIN (ZOCOR) 40 MG TABLET    Take 40 mg by mouth daily.   VERAPAMIL (VERELAN PM) 180 MG 24 HR CAPSULE    Take 180 mg by mouth daily.  Modified Medications   No medications on file  Discontinued Medications   No medications on file     Physical Exam:  Filed Vitals:   10/07/14 1452  BP: 124/70  Pulse: 72  Temp: 97.9 F (36.6 C)  TempSrc: Oral  Height: 5' 2.5" (1.588 m)  Weight: 123 lb (55.792  kg)  SpO2: 96%    Physical Exam  Constitutional: She is well-developed, well-nourished, and in no distress.  HENT:  Head: Normocephalic and atraumatic.  Right Ear: Tympanic membrane, external ear and ear canal normal.  Left Ear: Tympanic membrane, external ear and ear canal normal.  Nose: Nose normal.  Cardiovascular: Normal rate and intact distal pulses.  An irregularly irregular rhythm present.  No murmur heard. Venous stasis changes to BLE. Trace edema bilat  Pulmonary/Chest: Effort normal and breath sounds normal. No respiratory distress.  Abdominal: Soft. Bowel sounds are normal. She exhibits no distension.  Musculoskeletal: She exhibits no edema or tenderness.  Hallux valgus noted on the right.  Amputation of 3rd and 4th toes on the right  Neurological:    07/2014: MMSE 21/30  Skin: Skin is warm and dry. No erythema.  Callus noted to the plantar aspect of the right great toe. No redness or drainage to the area.     Labs reviewed/Significant Diagnostic Results: 2D echo 06/30/12 Study Conclusions  - Left ventricle: The cavity size was normal. There was mild   concentric hypertrophy. Systolic function was normal. The   estimated ejection fraction was in the range of 55% to   60%. Wall motion was normal; there were no regional wall   motion abnormalities. - Aortic valve: Mildly to moderately calcified annulus.   Mildly calcified leaflets. Mild regurgitation. - Mitral valve: Calcified annulus. - Atrial septum: There was an atrial septal aneurysm.  Basic Metabolic Panel:  Recent Labs  05/14/14 0804 09/01/14  NA 136* 137  K 3.9 4.1  CL 101  --   GLUCOSE 94  --   BUN 17 15  CREATININE 0.70 0.7   Liver Function Tests: No results for input(s): AST, ALT, ALKPHOS, BILITOT, PROT, ALBUMIN in the last 8760 hours. No results for input(s): LIPASE, AMYLASE in the last 8760 hours. No results for input(s): AMMONIA in the last 8760 hours. CBC:  Recent Labs  05/14/14 0804  HGB 13.6  HCT 40.0   CBG: No results for input(s): GLUCAP in the last 8760 hours. TSH: No results for input(s): TSH in the last 8760 hours. A1C: Lab Results  Component Value Date   HGBA1C 5.7* 07/01/2012   Lab Results  Component Value Date   CHOL 141 07/02/2012   Lab Results  Component Value Date   HDL 61 07/02/2012   Lab Results  Component Value Date   LDLCALC 64 07/02/2012   Lab Results  Component Value Date   TRIG 78 07/02/2012   Lab Results  Component Value Date   CHOLHDL 2.3 07/02/2012   No results found for: LDLDIRECT Assessment/Plan  1. Essential hypertension -will cont current regimen, follow up cmp   2. Neuropathy -reports occasional numbness and tingling in arms, hands and legs, discussed gabapentin will son and pt, will cont for  now   3. Cerebral infarction due to unspecified mechanism conts on ASA, will follow up CBC  4. Osteoarthritis, unspecified osteoarthritis type, unspecified site Stable at this time  5. Hyperlipidemia conts on zocor, will follow up fasting lipids   6. Preventive measure - DNR (Do Not Resuscitate)  7. Dementia, without behavioral disturbance -progressive memory loss noted. Discussed plan with son and pt. Will increase aricept to 10 mg qhs and then start namenda XR titration.   8. Constipation Controlled on current regimen  9. Osteoporosis conts on fosamax  60 mins in Time TOTAL:  time greater than 50% of total time spent doing pt counseling  and coordination of care regarding plan of care with pt and son

## 2014-10-11 DIAGNOSIS — F0390 Unspecified dementia without behavioral disturbance: Secondary | ICD-10-CM | POA: Insufficient documentation

## 2014-10-22 ENCOUNTER — Non-Acute Institutional Stay: Payer: Medicare Other | Admitting: Adult Health

## 2014-10-22 DIAGNOSIS — B353 Tinea pedis: Secondary | ICD-10-CM | POA: Insufficient documentation

## 2014-10-22 DIAGNOSIS — L84 Corns and callosities: Secondary | ICD-10-CM

## 2014-10-22 NOTE — Progress Notes (Signed)
Patient ID: Alexa Rowe, female   DOB: 1918-09-25, 80 y.o.   MRN: [id number]  Nursing Home Location:  [place] of Service: ALF [phone])  Chief Complaint  Patient presents with  . Acute Visit    right great toe pain    HPI:  81 y.o.  Female residing at [facility]. I was asked to see her today for right great toe pain. She has a hx of hallux valgus deformity and toe amputations. She also has a hx of callus to the right great toe that was debrided in November. She was fitted for new shoes on Monday and after that time developed pain. She also has redness and white drainage between her toes on the right foot. There are no reports of purulent drainage or fever.    Review of Systems:  Review of Systems  Constitutional: Negative for fever, chills, activity change and appetite change.  Respiratory: Negative for cough and shortness of breath.   Cardiovascular: Negative for chest pain and leg swelling.  Musculoskeletal: Positive for arthralgias and gait problem.  Skin: Positive for color change, rash and wound.    Medications: Patient's Medications  New Prescriptions   No medications on file  Previous Medications   ACETAMINOPHEN (TYLENOL) 325 MG TABLET    Take 2 tablets (650 mg total) by mouth every 6 (six) hours as needed for pain or fever.   ALENDRONATE (FOSAMAX) 70 MG TABLET    Take 70 mg by mouth once a week. Take with a full glass of water on an empty stomach.   ASPIRIN EC 81 MG EC TABLET    Take 1 tablet (81 mg total) by mouth daily.   CHOLECALCIFEROL (HM VITAMIN D3) 2000 UNITS CAPS    Take 1 capsule by mouth daily.    COD LIVER OIL PO    Take 1 tablet by mouth daily.    DONEPEZIL (ARICEPT) 5 MG TABLET    Take 5 mg by mouth at bedtime.   FEEDING SUPPLEMENT (ENSURE COMPLETE) LIQD    Take 237 mLs by mouth daily.   GABAPENTIN (NEURONTIN) 100 MG CAPSULE    Take 100 mg by mouth at bedtime.   HYDROCHLOROTHIAZIDE (HYDRODIURIL) 12.5 MG  TABLET    Take 12.5 mg by mouth daily.   METOPROLOL (LOPRESSOR) 50 MG TABLET    Take 50 mg by mouth. Take one tablet daily   SENNA-DOCUSATE (SENOKOT-S) 8.6-50 MG PER TABLET    Take 1 tablet by mouth at bedtime.   SIMVASTATIN (ZOCOR) 40 MG TABLET    Take 40 mg by mouth daily.   VERAPAMIL (VERELAN PM) 180 MG 24 HR CAPSULE    Take 180 mg by mouth daily.  Modified Medications   No medications on file  Discontinued Medications   No medications on file     Physical Exam:  Filed Vitals:   10/22/14 1624  BP: 126/74  Pulse: 96  Temp: 96.9 F (36.1 C)  Resp: 16  SpO2: 97%    Physical Exam  Constitutional: She is oriented to person, place, and time. No distress.  Pulmonary/Chest: Effort normal and breath sounds normal. No respiratory distress. She has no wheezes.  Neurological: She is alert and oriented to person, place, and time.  Skin: Skin is warm and dry. She is not diaphoretic.  Erythema to right great toe with white drainage in between toes. Tenderness on palpation to the right great toe with a callus. No purulent drainage or  open area.   Psychiatric: Affect normal.    Labs reviewed/Significant Diagnostic Results:  Basic Metabolic Panel:  Recent Labs  05/14/14 0804 09/01/14  NA 136* 137  K 3.9 4.1  CL 101  --   GLUCOSE 94  --   BUN 17 15  CREATININE 0.70 0.7   Liver Function Tests: No results for input(s): AST, ALT, ALKPHOS, BILITOT, PROT, ALBUMIN in the last 8760 hours. No results for input(s): LIPASE, AMYLASE in the last 8760 hours. No results for input(s): AMMONIA in the last 8760 hours. CBC:  Recent Labs  05/14/14 0804  HGB 13.6  HCT 40.0   CBG: No results for input(s): GLUCAP in the last 8760 hours. TSH: No results for input(s): TSH in the last 8760 hours. A1C: Lab Results  Component Value Date   HGBA1C 5.7* 07/01/2012   Lipid Panel: No results for input(s): CHOL, HDL, LDLCALC, TRIG, CHOLHDL, LDLDIRECT in the last 8760  hours.     Assessment/Plan Callus of foot Noted to right great toe. Previously debrided by Dr. Nyoka Cowden. Continues to have an area of pressure. Recommend open toe shoes. She has an apt with ortho tomorrow and they will address the need for another debridement.   Tinea pedis Clean and dry feet with soap and water and dry thoroughly qd. Clotrimazole to affected area BID for 2 weeks.    Cindi Carbon, ANP [facility] [phone]

## 2014-10-22 NOTE — Assessment & Plan Note (Signed)
Clean and dry feet with soap and water and dry thoroughly qd. Clotrimazole to affected area BID for 2 weeks.

## 2014-10-22 NOTE — Assessment & Plan Note (Signed)
Noted to right great toe. Previously debrided by Dr. Nyoka Cowden. Continues to have an area of pressure. Recommend open toe shoes. She has an apt with ortho tomorrow and they will address the need for another debridement.

## 2014-10-28 ENCOUNTER — Non-Acute Institutional Stay: Payer: Medicare Other | Admitting: Nurse Practitioner

## 2014-10-28 ENCOUNTER — Encounter: Payer: Self-pay | Admitting: Nurse Practitioner

## 2014-10-28 VITALS — BP 114/74 | HR 68 | Temp 97.5°F

## 2014-10-28 DIAGNOSIS — L03818 Cellulitis of other sites: Secondary | ICD-10-CM

## 2014-10-28 NOTE — Progress Notes (Signed)
Patient ID: CORINNE GOUCHER, female   DOB: October 10, 1917, 81 y.o.   MRN: [id number]    Patient ID: MARCUS GROLL, female   DOB: October 01, 1917, 81 y.o.   MRN: [id number]  Nursing Home Location:  [place] of Service:    Chief Complaint  Patient presents with  . great toe swollen    right foot. Here with son Eddie Dibbles    HPI:  81 y.o. female residing at [facility], [place] section seen today in clinic with chief complaint of swelling in her right great toe.  Swelling was noted 3 months ago and has not resolved. Pt does have hx of ulcer to toe with callus and tinea pedis of right foot.  Son is with patient and reports that the toe became acutely tender and red.  Denies injury.  She is followed by orthopedics and was seen last week and ortho did not debride ulcer.  She denies pain, except with weight bearing.  She does not take any medication for it, but does use clotrimazole topically on the feet for tinea pedis and staff does routine foot care. Uses speciality boot for right foot.  Review of Systems:  Review of Systems  Constitutional: Negative for activity change, appetite change, fatigue and unexpected weight change.  HENT: Positive for hearing loss. Negative for congestion, ear discharge, ear pain and tinnitus.   Eyes:       Uses corrective lens  Respiratory: Negative for cough and shortness of breath.   Cardiovascular: Negative for chest pain, palpitations and leg swelling.  Gastrointestinal: Negative for diarrhea and constipation.  Genitourinary: Negative for difficulty urinating.  Skin: Negative for color change, pallor, rash and wound.  Neurological: Negative for dizziness and headaches.    Medications: Patient's Medications  New Prescriptions   No medications on file  Previous Medications   ACETAMINOPHEN (TYLENOL) 325 MG TABLET    Take 2 tablets (650 mg total) by mouth every 6 (six) hours as needed for pain or fever.   ALENDRONATE (FOSAMAX)  70 MG TABLET    Take 70 mg by mouth once a week. Take with a full glass of water on an empty stomach.   ASPIRIN EC 81 MG EC TABLET    Take 1 tablet (81 mg total) by mouth daily.   CHOLECALCIFEROL (HM VITAMIN D3) 2000 UNITS CAPS    Take 1 capsule by mouth daily.    COD LIVER OIL PO    Take 1 tablet by mouth daily.    FEEDING SUPPLEMENT (ENSURE COMPLETE) LIQD    Take 237 mLs by mouth daily.   GABAPENTIN (NEURONTIN) 100 MG CAPSULE    Take 100 mg by mouth at bedtime.   HYDROCHLOROTHIAZIDE (HYDRODIURIL) 12.5 MG TABLET    Take 12.5 mg by mouth daily.   METOPROLOL (LOPRESSOR) 50 MG TABLET    Take 50 mg by mouth. Take one tablet daily   SENNA-DOCUSATE (SENOKOT-S) 8.6-50 MG PER TABLET    Take 1 tablet by mouth at bedtime.   SIMVASTATIN (ZOCOR) 40 MG TABLET    Take 40 mg by mouth daily.   VERAPAMIL (VERELAN PM) 180 MG 24 HR CAPSULE    Take 180 mg by mouth daily.  Modified Medications   No medications on file  Discontinued Medications   DONEPEZIL (ARICEPT) 5 MG TABLET    Take 5 mg by mouth at bedtime.     Physical Exam:  Filed Vitals:   10/28/14 1616  BP: 114/74  Pulse:  68  Temp: 97.5 F (36.4 C)  TempSrc: Oral    Physical Exam  Constitutional: She is well-developed, well-nourished, and in no distress.  HENT:  Head: Normocephalic and atraumatic.  Right Ear: Tympanic membrane and ear canal normal.  Left Ear: Tympanic membrane and ear canal normal.  Cardiovascular: Normal rate and intact distal pulses.  An irregularly irregular rhythm present.  No murmur heard. Venous stasis changes to BLE. Trace edema bilat  Pulmonary/Chest: Effort normal and breath sounds normal. No respiratory distress.  Musculoskeletal: She exhibits no edema or tenderness.  Hallux valgus noted on the right.  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  Right great toe plantar surface has a callus that is sloughing off.  Red, warm and Tender to the touch.   Neurological: She is alert.  07/2014: MMSE 21/30  Skin:  Skin is warm and dry. No erythema.    Labs reviewed/Significant Diagnostic Results: 2D echo 06/30/12 Study Conclusions  - Left ventricle: The cavity size was normal. There was mild   concentric hypertrophy. Systolic function was normal. The   estimated ejection fraction was in the range of 55% to   60%. Wall motion was normal; there were no regional wall   motion abnormalities. - Aortic valve: Mildly to moderately calcified annulus.   Mildly calcified leaflets. Mild regurgitation. - Mitral valve: Calcified annulus. - Atrial septum: There was an atrial septal aneurysm.  Basic Metabolic Panel:  Recent Labs  05/14/14 0804 09/01/14  NA 136* 137  K 3.9 4.1  CL 101  --   GLUCOSE 94  --   BUN 17 15  CREATININE 0.70 0.7   Liver Function Tests: No results for input(s): AST, ALT, ALKPHOS, BILITOT, PROT, ALBUMIN in the last 8760 hours. No results for input(s): LIPASE, AMYLASE in the last 8760 hours. No results for input(s): AMMONIA in the last 8760 hours. CBC:  Recent Labs  05/14/14 0804  HGB 13.6  HCT 40.0   CBG: No results for input(s): GLUCAP in the last 8760 hours. TSH: No results for input(s): TSH in the last 8760 hours. A1C: Lab Results  Component Value Date   HGBA1C 5.7* 07/01/2012   Lab Results  Component Value Date   CHOL 141 07/02/2012   Lab Results  Component Value Date   HDL 61 07/02/2012   Lab Results  Component Value Date   LDLCALC 64 07/02/2012   Lab Results  Component Value Date   TRIG 78 07/02/2012   Lab Results  Component Value Date   CHOLHDL 2.3 07/02/2012   No results found for: LDLDIRECT Assessment/Plan  1. Right great toe cellulitis - Will Rx for doxycycline po bid for 10 days.  Will make referral for podiatry.  She will have an appointment with podiatry on 2/9.  Son will take over foot care at night.  Follow up in 2 weeks.

## 2014-11-10 ENCOUNTER — Encounter: Payer: Self-pay | Admitting: *Deleted

## 2014-11-10 ENCOUNTER — Telehealth: Payer: Self-pay | Admitting: General Practice

## 2014-11-11 ENCOUNTER — Encounter: Payer: Self-pay | Admitting: Nurse Practitioner

## 2014-11-11 ENCOUNTER — Non-Acute Institutional Stay: Payer: Medicare Other | Admitting: Nurse Practitioner

## 2014-11-11 VITALS — BP 116/72 | HR 64 | Temp 98.0°F | Wt 124.0 lb

## 2014-11-11 DIAGNOSIS — L03818 Cellulitis of other sites: Secondary | ICD-10-CM

## 2014-11-11 NOTE — Progress Notes (Signed)
Patient ID: Alexa Rowe, female   DOB: [birth year], 81 y.o.   MRN: [id number]    Patient ID: Alexa Rowe, female   DOB: 04/22/1918, 81 y.o.   MRN: [id number]  Nursing Home Location:  [place] of Service: Clinic (12)  Chief Complaint  Patient presents with  . Medical Management of Chronic Issues    2 week follow-up on right foot    HPI:  81 y.o. female residing at [facility], [place] section seen today in clinic to follow up on her right great toe cellulitis.  She was seen 2 weeks ago and noted to have swelling that began 3 months ago and had not resolved. Son is with patient and reports that the toe became acutely tender and red. She was given doxycycline for 10 days and saw podiatry. completed doxycyline and overall much improved. Toe still remains large but this has been baseline for several months  Review of Systems:  Review of Systems  Constitutional: Negative for activity change, appetite change, fatigue and unexpected weight change.  HENT: Positive for hearing loss. Negative for congestion, ear discharge, ear pain and tinnitus.   Eyes:       Uses corrective lens  Respiratory: Negative for cough and shortness of breath.   Cardiovascular: Negative for chest pain, palpitations and leg swelling.  Gastrointestinal: Negative for diarrhea and constipation.  Genitourinary: Negative for difficulty urinating.  Skin: Negative for color change, pallor, rash and wound.  Neurological: Negative for dizziness and headaches.    Medications: Patient's Medications  New Prescriptions   No medications on file  Previous Medications   ACETAMINOPHEN (TYLENOL) 325 MG TABLET    Take 2 tablets (650 mg total) by mouth every 6 (six) hours as needed for pain or fever.   ALENDRONATE (FOSAMAX) 70 MG TABLET    Take 70 mg by mouth once a week. Take with a full glass of water on an empty stomach.   ASPIRIN EC 81 MG EC TABLET    Take 1 tablet (81 mg total) by  mouth daily.   CHOLECALCIFEROL (HM VITAMIN D3) 2000 UNITS CAPS    Take 1 capsule by mouth daily. Take 2 in morning, one in evening   CLOTRIMAZOLE (LOTRIMIN) 1 % CREAM    Apply 1 application topically 2 (two) times daily. Apply right foot between right big toe and open space   COD LIVER OIL PO    Take 1 tablet by mouth daily.    DONEPEZIL (ARICEPT) 10 MG TABLET    Take 10 mg by mouth at bedtime.   FEEDING SUPPLEMENT (ENSURE COMPLETE) LIQD    Take 237 mLs by mouth daily.   GABAPENTIN (NEURONTIN) 100 MG CAPSULE    Take 100 mg by mouth at bedtime.   HYDROCHLOROTHIAZIDE (HYDRODIURIL) 12.5 MG TABLET    Take 12.5 mg by mouth daily.   MEMANTINE (NAMENDA XR) 14 MG CP24 24 HR CAPSULE    Take 14 mg by mouth. For a week, then 21mg  for a week, then 28mg  daily   METOPROLOL SUCCINATE (TOPROL-XL) 50 MG 24 HR TABLET    Take 50 mg by mouth daily. Take with or immediately following a meal.   SENNA-DOCUSATE (SENOKOT-S) 8.6-50 MG PER TABLET    Take 1 tablet by mouth at bedtime.   SIMVASTATIN (ZOCOR) 40 MG TABLET    Take 40 mg by mouth daily.   VERAPAMIL (VERELAN PM) 180 MG 24 HR CAPSULE    Take 180 mg  by mouth daily.  Modified Medications   No medications on file  Discontinued Medications   METOPROLOL (LOPRESSOR) 50 MG TABLET    Take 50 mg by mouth. Take one tablet daily     Physical Exam:  Filed Vitals:   11/11/14 1302  BP: 116/72  Pulse: 64  Temp: 98 F (36.7 C)  TempSrc: Oral  Weight: 124 lb (56.246 kg)    Physical Exam  Constitutional: She is well-developed, well-nourished, and in no distress.  HENT:  Head: Normocephalic and atraumatic.  Right Ear: Tympanic membrane and ear canal normal.  Left Ear: Tympanic membrane and ear canal normal.  Cardiovascular: Normal rate and intact distal pulses.  An irregularly irregular rhythm present.  No murmur heard. Venous stasis changes to BLE. Trace edema bilat  Pulmonary/Chest: Effort normal and breath sounds normal. No respiratory distress.    Musculoskeletal: She exhibits no edema or tenderness.  Hallux valgus noted on the right.  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  Right great toe plantar surface has a callus.  Great toe is less swollen, no longer tender to touch and not warm.  Neurological: She is alert.  07/2014: MMSE 21/30  Skin: Skin is warm and dry. No erythema.    Labs reviewed/Significant Diagnostic Results: 2D echo 06/30/12 Study Conclusions  - Left ventricle: The cavity size was normal. There was mild   concentric hypertrophy. Systolic function was normal. The   estimated ejection fraction was in the range of 55% to   60%. Wall motion was normal; there were no regional wall   motion abnormalities. - Aortic valve: Mildly to moderately calcified annulus.   Mildly calcified leaflets. Mild regurgitation. - Mitral valve: Calcified annulus. - Atrial septum: There was an atrial septal aneurysm.  Basic Metabolic Panel:  Recent Labs  05/14/14 0804 09/01/14  NA 136* 137  K 3.9 4.1  CL 101  --   GLUCOSE 94  --   BUN 17 15  CREATININE 0.70 0.7   Liver Function Tests: No results for input(s): AST, ALT, ALKPHOS, BILITOT, PROT, ALBUMIN in the last 8760 hours. No results for input(s): LIPASE, AMYLASE in the last 8760 hours. No results for input(s): AMMONIA in the last 8760 hours. CBC:  Recent Labs  05/14/14 0804  HGB 13.6  HCT 40.0   CBG: No results for input(s): GLUCAP in the last 8760 hours. TSH: No results for input(s): TSH in the last 8760 hours. A1C: Lab Results  Component Value Date   HGBA1C 5.7* 07/01/2012   Lab Results  Component Value Date   CHOL 141 07/02/2012   Lab Results  Component Value Date   HDL 61 07/02/2012   Lab Results  Component Value Date   LDLCALC 64 07/02/2012   Lab Results  Component Value Date   TRIG 78 07/02/2012   Lab Results  Component Value Date   CHOLHDL 2.3 07/02/2012   No results found for: LDLDIRECT Assessment/Plan  1. Right great toe  cellulitis -  Resolved. Cont routine care, follow up with podiatry as needed.  Follow up as planned with Dr. Mariea Clonts in April and as needed before

## 2014-12-02 ENCOUNTER — Encounter: Payer: Self-pay | Admitting: Nurse Practitioner

## 2014-12-02 ENCOUNTER — Non-Acute Institutional Stay: Payer: Medicare Other | Admitting: Nurse Practitioner

## 2014-12-02 VITALS — BP 140/72 | HR 68 | Temp 97.8°F

## 2014-12-02 DIAGNOSIS — T148XXA Other injury of unspecified body region, initial encounter: Secondary | ICD-10-CM

## 2014-12-02 DIAGNOSIS — T148 Other injury of unspecified body region: Secondary | ICD-10-CM

## 2014-12-02 NOTE — Progress Notes (Signed)
Patient ID: Alexa Rowe, female   DOB: [birth year], 81 y.o.   MRN: [id number]    Patient ID: Alexa Rowe, female   DOB: 06-07-1918, 81 y.o.   MRN: [id number]  Nursing Home Location:  [place] of Service: Clinic (12)  Chief Complaint  Patient presents with  . Acute Visit    skin tear, ? injection left lower leg. Also check big toe on right foot    HPI:  81 y.o. female residing at [facility], [place] section seen today in clinic for chief complaint of left leg skin tear.  Patient is accompanied by her son who says that the skin tear has been on her leg for at least 3 months.  It is painful to palpation and scabbed over.  No fevers or chills. She was prescribed doxycycline in February for cellulitis of the right great toe which has resolved.   Review of Systems:  Review of Systems  Constitutional: Negative for activity change, appetite change, fatigue and unexpected weight change.  HENT: Positive for hearing loss.   Eyes:       Uses corrective lens  Respiratory: Negative for cough and shortness of breath.   Cardiovascular: Negative for chest pain, palpitations and leg swelling.  Gastrointestinal: Negative for diarrhea and constipation.  Musculoskeletal: Negative for back pain and gait problem.  Skin: Negative for color change, pallor, rash and wound.  Neurological: Negative for dizziness and headaches.    Medications: Patient's Medications  New Prescriptions   No medications on file  Previous Medications   ACETAMINOPHEN (TYLENOL) 325 MG TABLET    Take 2 tablets (650 mg total) by mouth every 6 (six) hours as needed for pain or fever.   ALENDRONATE (FOSAMAX) 70 MG TABLET    Take 70 mg by mouth once a week. Take with a full glass of water on an empty stomach.   ASPIRIN EC 81 MG EC TABLET    Take 1 tablet (81 mg total) by mouth daily.   CHOLECALCIFEROL (VITAMIN D) 1000 UNITS TABLET    Take 1,000 Units by mouth. Take 2 tablets in  morning and one tablet in evening   CLOTRIMAZOLE (LOTRIMIN) 1 % CREAM    Apply 1 application topically 2 (two) times daily. Apply right foot between right big toe and open space   COD LIVER OIL PO    Take 1 tablet by mouth daily.    DONEPEZIL (ARICEPT) 10 MG TABLET    Take 10 mg by mouth at bedtime.   FEEDING SUPPLEMENT (ENSURE COMPLETE) LIQD    Take 237 mLs by mouth daily.   GABAPENTIN (NEURONTIN) 100 MG CAPSULE    Take 100 mg by mouth at bedtime.   HYDROCHLOROTHIAZIDE (HYDRODIURIL) 12.5 MG TABLET    Take 12.5 mg by mouth daily.   MEMANTINE (NAMENDA XR) 28 MG CP24 24 HR CAPSULE    Take 28 mg by mouth. Take one tablet daily for memory   METOPROLOL SUCCINATE (TOPROL-XL) 50 MG 24 HR TABLET    Take 50 mg by mouth daily. Take with or immediately following a meal.   MULTIPLE VITAMINS-MINERALS (MULTIVITAMIN ADULT PO)    Take by mouth. Take one daily   SENNA-DOCUSATE (SENOKOT-S) 8.6-50 MG PER TABLET    Take 1 tablet by mouth at bedtime.   SIMVASTATIN (ZOCOR) 40 MG TABLET    Take 40 mg by mouth daily.   VERAPAMIL (VERELAN PM) 180 MG 24 HR CAPSULE    Take 180  mg by mouth daily.  Modified Medications   No medications on file  Discontinued Medications   CHOLECALCIFEROL (HM VITAMIN D3) 2000 UNITS CAPS    Take 1 capsule by mouth daily. Take 2 in morning, one in evening   MEMANTINE (NAMENDA XR) 14 MG CP24 24 HR CAPSULE    Take 14 mg by mouth. For a week, then 21mg  for a week, then 28mg  daily     Physical Exam:  Filed Vitals:   12/02/14 1636  BP: 140/72  Pulse: 68  Temp: 97.8 F (36.6 C)  TempSrc: Oral  SpO2: 96%    Physical Exam  Constitutional: She is well-developed, well-nourished, and in no distress.  HENT:  Head: Normocephalic and atraumatic.  Right Ear: Tympanic membrane and ear canal normal.  Left Ear: Tympanic membrane and ear canal normal.  Cardiovascular: Normal rate and intact distal pulses.  An irregularly irregular rhythm present.  No murmur heard. Venous stasis changes to BLE.  Trace edema bilat  Pulmonary/Chest: Effort normal and breath sounds normal.  Musculoskeletal: She exhibits no edema or tenderness.  Hallux valgus noted on the right.  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  Right great toe plantar surface has a callus.  Great toe is less swollen, no longer tender to touch and not warm.  Neurological: She is alert.  07/2014: MMSE 21/30  Skin: Skin is warm and dry. No erythema.  Skin tear present on left shin. Approx 2cm x 4 cm.  Has black scab.  Edges are approximated.  No drainage.  Erythema present around edges.  Painful to palpation but otherwise non painful      Labs reviewed/Significant Diagnostic Results: 2D echo 06/30/12 Study Conclusions  - Left ventricle: The cavity size was normal. There was mild   concentric hypertrophy. Systolic function was normal. The   estimated ejection fraction was in the range of 55% to   60%. Wall motion was normal; there were no regional wall   motion abnormalities. - Aortic valve: Mildly to moderately calcified annulus.   Mildly calcified leaflets. Mild regurgitation. - Mitral valve: Calcified annulus. - Atrial septum: There was an atrial septal aneurysm.  Basic Metabolic Panel:  Recent Labs  05/14/14 0804 09/01/14  NA 136* 137  K 3.9 4.1  CL 101  --   GLUCOSE 94  --   BUN 17 15  CREATININE 0.70 0.7   Liver Function Tests: No results for input(s): AST, ALT, ALKPHOS, BILITOT, PROT, ALBUMIN in the last 8760 hours. No results for input(s): LIPASE, AMYLASE in the last 8760 hours. No results for input(s): AMMONIA in the last 8760 hours. CBC:  Recent Labs  05/14/14 0804  HGB 13.6  HCT 40.0   CBG: No results for input(s): GLUCAP in the last 8760 hours. TSH: No results for input(s): TSH in the last 8760 hours. A1C: Lab Results  Component Value Date   HGBA1C 5.7* 07/01/2012   Lab Results  Component Value Date   CHOL 141 07/02/2012   Lab Results  Component Value Date   HDL 61 07/02/2012    Lab Results  Component Value Date   LDLCALC 64 07/02/2012   Lab Results  Component Value Date   TRIG 78 07/02/2012   Lab Results  Component Value Date   CHOLHDL 2.3 07/02/2012   No results found for: LDLDIRECT Assessment/Plan 1. Abrasion  Abrasion/Skin tear has been present for several months, son unsure if there as been an actual change to skin. Area with mild sounding  erythema. Use Bactroban ointment BID and keep area clean.  Discussed with nursing staff to monitor for erythema and fevers. Instructed son to keep a watch on it as well to see if any increased erythema occurs.  Will also have treatment nurse follow   Keep appointment with Dr. Mariea Clonts in April, sooner if needed

## 2014-12-24 LAB — HEPATIC FUNCTION PANEL
ALT: 13 U/L (ref 7–35)
AST: 18 U/L (ref 13–35)
Alkaline Phosphatase: 50 U/L (ref 25–125)
BILIRUBIN, TOTAL: 0.6 mg/dL

## 2014-12-24 LAB — LIPID PANEL
Cholesterol: 154 mg/dL (ref 0–200)
HDL: 76 mg/dL — AB (ref 35–70)
LDL Cholesterol: 59 mg/dL
TRIGLYCERIDES: 94 mg/dL (ref 40–160)

## 2014-12-24 LAB — BASIC METABOLIC PANEL
BUN: 17 mg/dL (ref 4–21)
Creatinine: 0.8 mg/dL (ref 0.5–1.1)
Potassium: 4.2 mmol/L (ref 3.4–5.3)
SODIUM: 135 mmol/L — AB (ref 137–147)

## 2014-12-24 LAB — CBC AND DIFFERENTIAL
HCT: 42 % (ref 36–46)
HEMOGLOBIN: 13.4 g/dL (ref 12.0–16.0)
Platelets: 241 10*3/uL (ref 150–399)
WBC: 6.6 10*3/mL

## 2014-12-29 ENCOUNTER — Encounter: Payer: Self-pay | Admitting: Internal Medicine

## 2014-12-29 ENCOUNTER — Non-Acute Institutional Stay: Payer: Medicare Other | Admitting: Internal Medicine

## 2014-12-29 VITALS — BP 148/76 | HR 68 | Temp 97.5°F | Wt 128.0 lb

## 2014-12-29 DIAGNOSIS — I10 Essential (primary) hypertension: Secondary | ICD-10-CM

## 2014-12-29 DIAGNOSIS — M810 Age-related osteoporosis without current pathological fracture: Secondary | ICD-10-CM | POA: Diagnosis not present

## 2014-12-29 DIAGNOSIS — M1990 Unspecified osteoarthritis, unspecified site: Secondary | ICD-10-CM

## 2014-12-29 DIAGNOSIS — F0390 Unspecified dementia without behavioral disturbance: Secondary | ICD-10-CM | POA: Diagnosis not present

## 2014-12-29 DIAGNOSIS — E785 Hyperlipidemia, unspecified: Secondary | ICD-10-CM | POA: Diagnosis not present

## 2014-12-29 DIAGNOSIS — K5900 Constipation, unspecified: Secondary | ICD-10-CM | POA: Diagnosis not present

## 2014-12-29 NOTE — Progress Notes (Signed)
Patient ID: Alexa Rowe, female   DOB: 04-20-1918, 81 y.o.   MRN: [id number]   Location:  [facility]  Code Status: DNR  Allergies  Allergen Reactions  . Methotrexate Derivatives     Chief Complaint  Patient presents with  . Medical Management of Chronic Issues    blood pressure, dementia    HPI: Patient is a 81 y.o. female seen in the office today for med mgt of chronic diseases.  Her labs were reviewed.    Her bp was elevated per old guidelines but at goal for latest for her age group <150/90.    She is doing ok w/o concerns--denies everything.  Review of Systems:  Review of Systems  Constitutional: Negative for fever and chills.  Respiratory: Negative for shortness of breath.   Cardiovascular: Negative for chest pain.  Gastrointestinal: Negative for abdominal pain.  Genitourinary: Negative for dysuria.  Musculoskeletal: Positive for joint pain.  Psychiatric/Behavioral: Positive for memory loss. Negative for depression.     Past Medical History  Diagnosis Date  . Hypertension   . TIA (transient ischemic attack)   . Cognitive decline   . UTI (lower urinary tract infection)   . Debility   . GERD (gastroesophageal reflux disease)   . Wears glasses   . Cancer     ovarian,breast  . Stroke 1996    TIA 1996  . Hyperlipidemia   . Arthritis     rhumatoid, osteo,  . Adult failure to thrive   . Pyelonephritis   . Spondylolysis of cervical region   . Adenocarcinoma (epithelial) of ovary   . Mild aortic insufficiency   . RA (rheumatoid arthritis)   . Breast cancer     LEFT  . Insomnia   . Mitral regurgitation   . Osteopenia     Past Surgical History  Procedure Laterality Date  . Toe amputation Bilateral 05/26/12    bilateral second toe amputations  . Knee arthroscopy Left   . Abdominal hysterectomy  1989    tah/bso-cancer-radiation  . Breast lumpectomy      left  . Eye surgery Bilateral     cataracts  . Amputation Right 05/14/2014    Procedure:  RIGHT THIRD TOE AMPUTATION ;  Surgeon: Wylene Simmer, MD;  Location: [facility];  Service: Orthopedics;  Laterality: Right;    Social History:   reports that she has never smoked. She has never used smokeless tobacco. She reports that she does not drink alcohol or use illicit drugs.  Family History  Problem Relation Age of Onset  . Heart disease Father     Medications: Patient's Medications  New Prescriptions   No medications on file  Previous Medications   ACETAMINOPHEN (TYLENOL) 325 MG TABLET    Take 2 tablets (650 mg total) by mouth every 6 (six) hours as needed for pain or fever.   ALENDRONATE (FOSAMAX) 70 MG TABLET    Take 70 mg by mouth once a week. Take with a full glass of water on an empty stomach.   ASPIRIN EC 81 MG EC TABLET    Take 1 tablet (81 mg total) by mouth daily.   CHOLECALCIFEROL (VITAMIN D) 1000 UNITS TABLET    Take 1,000 Units by mouth. Take 2 tablets in morning and one tablet in evening   COD LIVER OIL PO    Take 1 tablet by mouth daily.    DONEPEZIL (ARICEPT) 10 MG TABLET    Take 10 mg by mouth at bedtime.  FEEDING SUPPLEMENT (ENSURE COMPLETE) LIQD    Take 237 mLs by mouth daily.   GABAPENTIN (NEURONTIN) 100 MG CAPSULE    Take 100 mg by mouth at bedtime.   HYDROCHLOROTHIAZIDE (HYDRODIURIL) 12.5 MG TABLET    Take 12.5 mg by mouth daily.   MEMANTINE (NAMENDA XR) 28 MG CP24 24 HR CAPSULE    Take 28 mg by mouth. Take one tablet daily for memory   METOPROLOL SUCCINATE (TOPROL-XL) 50 MG 24 HR TABLET    Take 50 mg by mouth daily. Take with or immediately following a meal.   MULTIPLE VITAMINS-MINERALS (MULTIVITAMIN ADULT PO)    Take by mouth. Take one daily   SENNA-DOCUSATE (SENOKOT-S) 8.6-50 MG PER TABLET    Take 1 tablet by mouth at bedtime.   SIMVASTATIN (ZOCOR) 40 MG TABLET    Take 40 mg by mouth daily.   VERAPAMIL (VERELAN PM) 180 MG 24 HR CAPSULE    Take 180 mg by mouth daily.  Modified Medications   No medications on file  Discontinued Medications    CLOTRIMAZOLE (LOTRIMIN) 1 % CREAM    Apply 1 application topically 2 (two) times daily. Apply right foot between right big toe and open space     Physical Exam: Filed Vitals:   12/29/14 1629  BP: 148/76  Pulse: 68  Temp: 97.5 F (36.4 C)  TempSrc: Oral  Weight: 128 lb (58.06 kg)  SpO2: 98%  Physical Exam  Constitutional: She appears well-developed and well-nourished.  Cardiovascular: Normal rate, regular rhythm and intact distal pulses.   Murmur heard. Pulmonary/Chest: Effort normal and breath sounds normal.  Abdominal: Soft. Bowel sounds are normal. She exhibits no distension and no mass. There is no tenderness.  Musculoskeletal: Normal range of motion.  Neurological: She is alert.  Skin: Skin is warm and dry.    Labs reviewed: Basic Metabolic Panel:  Recent Labs  05/14/14 0804 09/01/14  NA 136* 137  K 3.9 4.1  CL 101  --   GLUCOSE 94  --   BUN 17 15  CREATININE 0.70 0.7   Liver Function Tests: No results for input(s): AST, ALT, ALKPHOS, BILITOT, PROT, ALBUMIN in the last 8760 hours. No results for input(s): LIPASE, AMYLASE in the last 8760 hours. No results for input(s): AMMONIA in the last 8760 hours. CBC:  Recent Labs  05/14/14 0804  HGB 13.6  HCT 40.0   Lipid Panel: No results for input(s): CHOL, HDL, LDLCALC, TRIG, CHOLHDL, LDLDIRECT in the last 8760 hours. Lab Results  Component Value Date   HGBA1C 5.7* 07/01/2012    Assessment/Plan 1. Essential hypertension -cont hctz, toprol, verapamil -at goal for her frailty (<150/90)  2. Hyperlipidemia -cont zocor 40mg  for this--f/u lipids  3. Osteoarthritis, unspecified osteoarthritis type, unspecified site -cont tylenol with benefit  4. Dementia, without behavioral disturbance -cont aricept and namenda XR--could convert to namzaric next time (didn't note until after visit)  5. Osteoporosis -cont vitamin D supplement and fosamax  -last bone density was in June 2014 so consider f/u at next visit  to assess if fosamax is effective for her or if alternative needed  6. Constipation, unspecified constipation type -cont senna s for bowel regimen  Labs/tests ordered: cbc, bmp, hep panel, lipid panel Next appt:  3 mos  Hallee Mckenny L. Izabel Chim, D.O. [facility] [address], [place] [address] Cell Phone (Mon-Fri 8am-5pm):  [phone] On Call:  [phone] & follow prompts after 5pm & weekends Office Phone:  [phone]  Office Fax:  [phone]

## 2015-01-01 ENCOUNTER — Encounter: Payer: Self-pay | Admitting: Internal Medicine

## 2015-02-23 ENCOUNTER — Encounter: Payer: Self-pay | Admitting: Adult Health

## 2015-02-23 ENCOUNTER — Non-Acute Institutional Stay: Payer: Medicare Other | Admitting: Adult Health

## 2015-02-23 DIAGNOSIS — N329 Bladder disorder, unspecified: Secondary | ICD-10-CM | POA: Diagnosis not present

## 2015-02-23 DIAGNOSIS — K5289 Other specified noninfective gastroenteritis and colitis: Secondary | ICD-10-CM

## 2015-02-24 ENCOUNTER — Encounter: Payer: Self-pay | Admitting: Nurse Practitioner

## 2015-02-24 ENCOUNTER — Non-Acute Institutional Stay: Payer: Medicare Other | Admitting: Nurse Practitioner

## 2015-02-24 VITALS — BP 136/80 | HR 68 | Temp 97.8°F

## 2015-02-24 DIAGNOSIS — K529 Noninfective gastroenteritis and colitis, unspecified: Secondary | ICD-10-CM

## 2015-02-24 LAB — BASIC METABOLIC PANEL
BUN: 12 mg/dL (ref 4–21)
CREATININE: 0.8 mg/dL (ref 0.5–1.1)
GLUCOSE: 84 mg/dL
Potassium: 3.2 mmol/L — AB (ref 3.4–5.3)
Sodium: 132 mmol/L — AB (ref 137–147)

## 2015-02-24 NOTE — Progress Notes (Signed)
Patient ID: Alexa Rowe, female   DOB: Feb 10, 1918, 81 y.o.   MRN: [id number]    Patient ID: Alexa Rowe, female   DOB: 19-Jan-1918, 81 y.o.   MRN: [id number]  Nursing Home Location:  [place] [facility]: Clinic (12)  Chief Complaint  Patient presents with  . Acute Visit    vomiting, Monday and Tuesday, weakness in bed past few days and dizzy.  Here with son Eddie Dibbles and daughter-in-law Shirlean Mylar    HPI:  81 y.o. female residing at [facility], [place] section seen today in clinic for chief complaint as above.  For 2 days pt has been vomiting and weak.  Keeping down fluids, water Gatorade.  minimal eating. Has eaten soup. No nausea, no vomiting today. Last episode was yesterday. BMP done revealing sodium of 130, hctz is currently on hold. Follow up bmp tomorrow scheduled.  Took phenergan PR which helped No diarrhea No fevers or chills.   Review of Systems:  Review of Systems  Constitutional: Negative for activity change, appetite change, fatigue and unexpected weight change.  HENT: Positive for hearing loss.   Eyes:       Uses corrective lens  Respiratory: Negative for cough and shortness of breath.   Cardiovascular: Negative for chest pain, palpitations and leg swelling.  Gastrointestinal: Positive for nausea and vomiting. Negative for abdominal pain, diarrhea, constipation and abdominal distention.       No nausea or vomiting since yesterday   Musculoskeletal: Negative for back pain and gait problem.  Skin: Negative for color change, pallor, rash and wound.  Neurological: Negative for dizziness and headaches.    Medications: Patient's Medications  New Prescriptions   No medications on file  Previous Medications   ACETAMINOPHEN (TYLENOL) 325 MG TABLET    Take 2 tablets (650 mg total) by mouth every 6 (six) hours as needed for pain or fever.   ALENDRONATE (FOSAMAX) 70 MG TABLET    Take 70 mg by mouth once a week. Take with a full  glass of water on an empty stomach.   ASPIRIN EC 81 MG EC TABLET    Take 1 tablet (81 mg total) by mouth daily.   CHOLECALCIFEROL (VITAMIN D) 1000 UNITS TABLET    Take 1,000 Units by mouth. Take 2 tablets in morning and one tablet in evening   COD LIVER OIL PO    Take 1 tablet by mouth daily.    DONEPEZIL (ARICEPT) 10 MG TABLET    Take 10 mg by mouth at bedtime.   FEEDING SUPPLEMENT (ENSURE COMPLETE) LIQD    Take 237 mLs by mouth daily.   GABAPENTIN (NEURONTIN) 100 MG CAPSULE    Take 100 mg by mouth at bedtime.   HYDROCHLOROTHIAZIDE (HYDRODIURIL) 12.5 MG TABLET    Take 12.5 mg by mouth daily.   MEMANTINE (NAMENDA XR) 28 MG CP24 24 HR CAPSULE    Take 28 mg by mouth. Take one tablet daily for memory   METOPROLOL SUCCINATE (TOPROL-XL) 50 MG 24 HR TABLET    Take 50 mg by mouth daily. Take with or immediately following a meal.   MULTIPLE VITAMINS-MINERALS (MULTIVITAMIN ADULT PO)    Take by mouth. Take one daily   SENNA-DOCUSATE (SENOKOT-S) 8.6-50 MG PER TABLET    Take 1 tablet by mouth at bedtime.   SIMVASTATIN (ZOCOR) 40 MG TABLET    Take 40 mg by mouth daily.   VERAPAMIL (VERELAN PM) 180 MG 24 HR CAPSULE  Take 180 mg by mouth daily.  Modified Medications   No medications on file  Discontinued Medications   No medications on file     Physical Exam:  Filed Vitals:   02/24/15 1446  BP: 136/80  Pulse: 68  Temp: 97.8 F (36.6 C)  TempSrc: Oral  SpO2: 95%    Physical Exam  Constitutional: She is well-developed, well-nourished, and in no distress.  HENT:  Head: Normocephalic and atraumatic.  Right Ear: Tympanic membrane and ear canal normal.  Left Ear: Tympanic membrane and ear canal normal.  Cardiovascular: Normal rate and intact distal pulses.  An irregularly irregular rhythm present.  No murmur heard. Venous stasis changes to BLE. Trace edema bilat  Pulmonary/Chest: Effort normal and breath sounds normal.  Abdominal: Soft. Bowel sounds are normal. She exhibits no distension.  There is no tenderness.  Neurological: She is alert.  07/2014: MMSE 21/30  Skin: Skin is warm and dry. No erythema.    Labs reviewed/Significant Diagnostic Results: 2D echo 06/30/12 Study Conclusions  - Left ventricle: The cavity size was normal. There was mild   concentric hypertrophy. Systolic function was normal. The   estimated ejection fraction was in the range of 55% to   60%. Wall motion was normal; there were no regional wall   motion abnormalities. - Aortic valve: Mildly to moderately calcified annulus.   Mildly calcified leaflets. Mild regurgitation. - Mitral valve: Calcified annulus. - Atrial septum: There was an atrial septal aneurysm.  Basic Metabolic Panel:  Recent Labs  05/14/14 0804 09/01/14 12/24/14  NA 136* 137 135*  K 3.9 4.1 4.2  CL 101  --   --   GLUCOSE 94  --   --   BUN 17 15 17   CREATININE 0.70 0.7 0.8   Liver Function Tests:  Recent Labs  12/24/14  AST 18  ALT 13  ALKPHOS 50   No results for input(s): LIPASE, AMYLASE in the last 8760 hours. No results for input(s): AMMONIA in the last 8760 hours. CBC:  Recent Labs  05/14/14 0804 12/24/14  WBC  --  6.6  HGB 13.6 13.4  HCT 40.0 42  PLT  --  241   CBG: No results for input(s): GLUCAP in the last 8760 hours. TSH: No results for input(s): TSH in the last 8760 hours. A1C: Lab Results  Component Value Date   HGBA1C 5.7* 07/01/2012   Lab Results  Component Value Date   CHOL 154 12/24/2014   CHOL 141 07/02/2012   Lab Results  Component Value Date   HDL 76* 12/24/2014   HDL 61 07/02/2012   Lab Results  Component Value Date   LDLCALC 59 12/24/2014   LDLCALC 64 07/02/2012   Lab Results  Component Value Date   TRIG 94 12/24/2014   TRIG 78 07/02/2012   Lab Results  Component Value Date   CHOLHDL 2.3 07/02/2012   No results found for: LDLDIRECT Assessment/Plan 1. Gastroenteritis -supportive care, encouraged hydration and bland diet and advanced as tolerated -zofran 4 mg  every 6 hours as needed for nausea -follow up bmp in am

## 2015-02-25 ENCOUNTER — Encounter: Payer: Self-pay | Admitting: Adult Health

## 2015-02-25 NOTE — Progress Notes (Signed)
Patient ID: Alexa Rowe, female   DOB: June 07, 1918, 81 y.o.   MRN: [id number] Patient Care Team: Gayland Curry, DO as PCP - General (Geriatric Medicine) Wylene Simmer, MD as Consulting Physician (Orthopedic Surgery) Well De Leon, NP as Nurse Practitioner (Nurse Practitioner)  Nursing Home Location:  [place] of Service:  assisted living room 533  Chief Complaint  Patient presents with  . Acute Visit    vomiting    HPI:  81 y.o. female residing at [facility], assisted living. I was asked to see her today due to n/v present for 24 + hrs. She has had a phenergan supp and this helped with nausea but did make her feel "woozy".  She has not had a fever. She denies cough, abd pain, diarrhea, fever, SOB, or pain on urination. She was only able to eat orange juice this am.  She has not been able to take her meds this morning. The staff reports that she is having regular bm's.     Review of Systems:  Review of Systems  Constitutional: Negative for fever, chills, diaphoresis, activity change and appetite change.  HENT: Negative for congestion, rhinorrhea, sinus pressure and sneezing.   Respiratory: Negative for cough and shortness of breath.   Cardiovascular: Negative for chest pain and leg swelling.  Gastrointestinal: Positive for nausea and vomiting. Negative for abdominal pain, diarrhea, constipation, blood in stool, abdominal distention and rectal pain.  Genitourinary: Negative for dysuria and difficulty urinating.  Neurological: Negative for dizziness and facial asymmetry.  Psychiatric/Behavioral: Negative for confusion and agitation.    Medications: Patient's Medications  New Prescriptions   No medications on file  Previous Medications   ACETAMINOPHEN (TYLENOL) 325 MG TABLET    Take 2 tablets (650 mg total) by mouth every 6 (six) hours as needed for pain or fever.   ALENDRONATE (FOSAMAX)  70 MG TABLET    Take 70 mg by mouth once a week. Take with a full glass of water on an empty stomach.   ASPIRIN EC 81 MG EC TABLET    Take 1 tablet (81 mg total) by mouth daily.   CHOLECALCIFEROL (VITAMIN D) 1000 UNITS TABLET    Take 1,000 Units by mouth. Take 2 tablets in morning and one tablet in evening   COD LIVER OIL PO    Take 1 tablet by mouth daily.    DONEPEZIL (ARICEPT) 10 MG TABLET    Take 10 mg by mouth at bedtime.   FEEDING SUPPLEMENT (ENSURE COMPLETE) LIQD    Take 237 mLs by mouth daily.   GABAPENTIN (NEURONTIN) 100 MG CAPSULE    Take 100 mg by mouth at bedtime.   HYDROCHLOROTHIAZIDE (HYDRODIURIL) 12.5 MG TABLET    Take 12.5 mg by mouth daily.   MEMANTINE (NAMENDA XR) 28 MG CP24 24 HR CAPSULE    Take 28 mg by mouth. Take one tablet daily for memory   METOPROLOL SUCCINATE (TOPROL-XL) 50 MG 24 HR TABLET    Take 50 mg by mouth daily. Take with or immediately following a meal.   MULTIPLE VITAMINS-MINERALS (MULTIVITAMIN ADULT PO)    Take by mouth. Take one daily   SENNA-DOCUSATE (SENOKOT-S) 8.6-50 MG PER TABLET    Take 1 tablet by mouth at bedtime.   SIMVASTATIN (ZOCOR) 40 MG TABLET    Take 40 mg by mouth daily.   VERAPAMIL (VERELAN PM) 180 MG 24 HR CAPSULE    Take 180  mg by mouth daily.  Modified Medications   No medications on file  Discontinued Medications   No medications on file     Physical Exam:  Filed Vitals:   02/25/15 1707  BP: 157/82  Pulse: 70  Temp: 97.4 F (36.3 C)  Resp: 20  SpO2: 100%    Physical Exam  Constitutional: No distress.  HENT:  Head: Normocephalic and atraumatic.  Nose: Nose normal.  Mouth/Throat: Oropharynx is clear and moist. No oropharyngeal exudate.  Eyes: Conjunctivae and EOM are normal. Pupils are equal, round, and reactive to light. Right eye exhibits no discharge. Left eye exhibits no discharge.  Neck: Normal range of motion. Neck supple. No JVD present. No tracheal deviation present. No thyromegaly present.  Cardiovascular: Normal  rate and regular rhythm.   No murmur heard. Pulmonary/Chest: Effort normal and breath sounds normal. No respiratory distress. She has no wheezes.  Abdominal: Soft. Bowel sounds are normal. She exhibits no distension.  S/p tenderness noted  Lymphadenopathy:    She has no cervical adenopathy.  Neurological: She is alert.  Oriented x2  Skin: Skin is warm and dry. She is not diaphoretic.  Psychiatric: Affect normal.    Labs reviewed/Significant Diagnostic Results:  Basic Metabolic Panel:  Recent Labs  05/14/14 0804 09/01/14 12/24/14  NA 136* 137 135*  K 3.9 4.1 4.2  CL 101  --   --   GLUCOSE 94  --   --   BUN 17 15 17   CREATININE 0.70 0.7 0.8   Liver Function Tests:  Recent Labs  12/24/14  AST 18  ALT 13  ALKPHOS 50   No results for input(s): LIPASE, AMYLASE in the last 8760 hours. No results for input(s): AMMONIA in the last 8760 hours. CBC:  Recent Labs  05/14/14 0804 12/24/14  WBC  --  6.6  HGB 13.6 13.4  HCT 40.0 42  PLT  --  241   CBG: No results for input(s): GLUCAP in the last 8760 hours. TSH: No results for input(s): TSH in the last 8760 hours. A1C: Lab Results  Component Value Date   HGBA1C 5.7* 07/01/2012   Lipid Panel:  Recent Labs  12/24/14  CHOL 154  HDL 76*  LDLCALC 59  TRIG 94       Assessment/Plan  1) Gastroenteritis Encourage fluids F/U BMP  Continue Phenergan as needed  2) Bladder tenderness Obtain UA C and S    Cindi Carbon, ANP [facility] [phone]

## 2015-03-01 LAB — BASIC METABOLIC PANEL
BUN: 14 mg/dL (ref 4–21)
Creatinine: 0.8 mg/dL (ref 0.5–1.1)
GLUCOSE: 93 mg/dL
POTASSIUM: 4.1 mmol/L (ref 3.4–5.3)
Sodium: 134 mmol/L — AB (ref 137–147)

## 2015-03-03 ENCOUNTER — Encounter: Payer: Self-pay | Admitting: Internal Medicine

## 2015-03-03 ENCOUNTER — Non-Acute Institutional Stay: Payer: Medicare Other | Admitting: Internal Medicine

## 2015-03-03 VITALS — BP 104/84 | HR 60 | Temp 97.5°F

## 2015-03-03 DIAGNOSIS — E871 Hypo-osmolality and hyponatremia: Secondary | ICD-10-CM

## 2015-03-03 DIAGNOSIS — K529 Noninfective gastroenteritis and colitis, unspecified: Secondary | ICD-10-CM | POA: Diagnosis not present

## 2015-03-03 DIAGNOSIS — F0390 Unspecified dementia without behavioral disturbance: Secondary | ICD-10-CM | POA: Diagnosis not present

## 2015-03-03 DIAGNOSIS — R531 Weakness: Secondary | ICD-10-CM | POA: Diagnosis not present

## 2015-03-03 DIAGNOSIS — N390 Urinary tract infection, site not specified: Secondary | ICD-10-CM

## 2015-03-03 DIAGNOSIS — I10 Essential (primary) hypertension: Secondary | ICD-10-CM | POA: Diagnosis not present

## 2015-03-03 LAB — HEPATIC FUNCTION PANEL
ALT: 13 U/L (ref 7–35)
AST: 13 U/L (ref 13–35)
Alkaline Phosphatase: 44 U/L (ref 25–125)
BILIRUBIN, TOTAL: 0.4 mg/dL

## 2015-03-03 LAB — BASIC METABOLIC PANEL
BUN: 1 mg/dL — AB (ref 4–21)
Glucose: 88 mg/dL
Potassium: 3.9 mmol/L (ref 3.4–5.3)
SODIUM: 138 mmol/L (ref 137–147)

## 2015-03-03 NOTE — Progress Notes (Signed)
Patient ID: Alexa Rowe, female   DOB: 10-Oct-1917, 81 y.o.   MRN: [id number]   Location:  [facility]  Code Status: DNR  Goals of Care:Advanced Directive information Does patient have an advance directive?: Yes, Type of Advance Directive: [place];Living will;Out of facility DNR (pink MOST or yellow form), Pre-existing out of facility DNR order (yellow form or pink MOST form): Yellow form placed in chart (order not valid for inpatient use), Does patient want to make changes to advanced directive?: No - Patient declined  Chief Complaint  Patient presents with  . Acute Visit    feels weak, "like she's going to faint". Had diarrhea 2 days ago, 2 days after vomting stopped. Here with daughter-in-law Alexa Rowe, she has a poor appetite, afraid she's getting dehydrated    HPI: Patient is a 81 y.o.  seen in the [facility] today for an acute visit due to continued weakness, dizziness and poor intake.  Finishes cipro for UTI on 03/05/15.  Continues to feel weak, faint, dizzy on standing.  She has poor appetite and nausea.  Yesterday, attempted meals but only a few bites here and there.  Doing boost, gatorade but will only take a couple of sips.    Upon review of all of her visits and labs related to her current condition, it appears she most likely has had gastroenteritis but is taking a while to recover.    Review of Systems:  Review of Systems  Constitutional: Positive for malaise/fatigue. Negative for fever and chills.       Was too weak to weigh  HENT: Negative for congestion.   Respiratory: Negative for shortness of breath.   Cardiovascular: Negative for chest pain.  Gastrointestinal: Negative for nausea, vomiting, abdominal pain, diarrhea and constipation.       Appetite poor  Genitourinary: Negative for dysuria, urgency and frequency.  Musculoskeletal: Negative for myalgias and falls.  Neurological: Positive for dizziness and weakness. Negative for loss of  consciousness and headaches.  Psychiatric/Behavioral: Positive for depression and memory loss.    Past Medical History  Diagnosis Date  . Hypertension   . TIA (transient ischemic attack)   . Cognitive decline   . UTI (lower urinary tract infection)   . Debility   . GERD (gastroesophageal reflux disease)   . Wears glasses   . Cancer     ovarian,breast  . Stroke 1996    TIA 1996  . Hyperlipidemia   . Arthritis     rhumatoid, osteo,  . Adult failure to thrive   . Pyelonephritis   . Spondylolysis of cervical region   . Adenocarcinoma (epithelial) of ovary   . Mild aortic insufficiency   . RA (rheumatoid arthritis)   . Breast cancer     LEFT  . Insomnia   . Mitral regurgitation   . Osteopenia     Past Surgical History  Procedure Laterality Date  . Toe amputation Bilateral 05/26/12    bilateral second toe amputations  . Knee arthroscopy Left   . Abdominal hysterectomy  1989    tah/bso-cancer-radiation  . Breast lumpectomy      left  . Eye surgery Bilateral     cataracts  . Amputation Right 05/14/2014    Procedure: RIGHT THIRD TOE AMPUTATION ;  Surgeon: Wylene Simmer, MD;  Location: [facility];  Service: Orthopedics;  Laterality: Right;    Social History:   reports that she has never smoked. She has never used smokeless tobacco.  She reports that she does not drink alcohol or use illicit drugs.  Allergies  Allergen Reactions  . Methotrexate Derivatives     Medications: Patient's Medications  New Prescriptions   No medications on file  Previous Medications   ACETAMINOPHEN (TYLENOL) 325 MG TABLET    Take 2 tablets (650 mg total) by mouth every 6 (six) hours as needed for pain or fever.   ALENDRONATE (FOSAMAX) 70 MG TABLET    Take 70 mg by mouth once a week. Take with a full glass of water on an empty stomach.   ASPIRIN EC 81 MG EC TABLET    Take 1 tablet (81 mg total) by mouth daily.   CHOLECALCIFEROL (VITAMIN D) 1000 UNITS TABLET    Take 1,000 Units by  mouth. Take 2 tablets in morning and one tablet in evening   CIPROFLOXACIN (CIPRO) 250 MG TABLET    Take 250 mg by mouth 2 (two) times daily.   COD LIVER OIL PO    Take 1 tablet by mouth daily.    DONEPEZIL (ARICEPT) 10 MG TABLET    Take 10 mg by mouth at bedtime.   FEEDING SUPPLEMENT (ENSURE COMPLETE) LIQD    Take 237 mLs by mouth daily.   GABAPENTIN (NEURONTIN) 100 MG CAPSULE    Take 100 mg by mouth at bedtime.   HYDROCHLOROTHIAZIDE (HYDRODIURIL) 12.5 MG TABLET    Take 12.5 mg by mouth daily.   MEMANTINE (NAMENDA XR) 28 MG CP24 24 HR CAPSULE    Take 28 mg by mouth. Take one tablet daily for memory   METOPROLOL SUCCINATE (TOPROL-XL) 50 MG 24 HR TABLET    Take 50 mg by mouth daily. Take with or immediately following a meal.   MULTIPLE VITAMINS-MINERALS (MULTIVITAMIN ADULT PO)    Take by mouth. Take one daily   ONDANSETRON (ZOFRAN) 4 MG TABLET    Take 4 mg by mouth. Take one every 6 hours as needed for nausea   SACCHAROMYCES BOULARDII (FLORASTOR) 250 MG CAPSULE    Take 250 mg by mouth 2 (two) times daily.   SENNA-DOCUSATE (SENOKOT-S) 8.6-50 MG PER TABLET    Take 1 tablet by mouth at bedtime.   SIMVASTATIN (ZOCOR) 40 MG TABLET    Take 40 mg by mouth daily.   VERAPAMIL (VERELAN PM) 180 MG 24 HR CAPSULE    Take 180 mg by mouth daily.  Modified Medications   No medications on file  Discontinued Medications   No medications on file     Physical Exam: Filed Vitals:   03/03/15 1109  BP: 104/84  Pulse: 60  Temp: 97.5 F (36.4 C)  TempSrc: Oral  SpO2: 96%   There is no weight on file to calculate BMI. Physical Exam  Constitutional:  Thin white female came over in wheelchair due to weakness  Cardiovascular: Normal rate, regular rhythm, normal heart sounds and intact distal pulses.   Pulmonary/Chest: Effort normal and breath sounds normal. No respiratory distress.  Abdominal: Soft. Bowel sounds are normal. She exhibits no distension and no mass. There is no tenderness.  Musculoskeletal:  Normal range of motion.  Neurological: She is alert.  Skin: Skin is warm and dry.  Psychiatric: She has a normal mood and affect.     Labs reviewed: Basic Metabolic Panel:  Recent Labs  05/14/14 0804 09/01/14 12/24/14  NA 136* 137 135*  K 3.9 4.1 4.2  CL 101  --   --   GLUCOSE 94  --   --  BUN 17 15 17   CREATININE 0.70 0.7 0.8   Liver Function Tests:  Recent Labs  12/24/14  AST 18  ALT 13  ALKPHOS 50   No results for input(s): LIPASE, AMYLASE in the last 8760 hours. No results for input(s): AMMONIA in the last 8760 hours. CBC:  Recent Labs  05/14/14 0804 12/24/14  WBC  --  6.6  HGB 13.6 13.4  HCT 40.0 42  PLT  --  241   Lipid Panel:  Recent Labs  12/24/14  CHOL 154  HDL 76*  LDLCALC 59  TRIG 94   Lab Results  Component Value Date   HGBA1C 5.7* 07/01/2012    Procedures since last appt:  Patient Care Team: Gayland Curry, DO as PCP - General (Geriatric Medicine) Wylene Simmer, MD as Consulting Physician (Orthopedic Surgery) [facility] [place], NP as Nurse Practitioner (Nurse Practitioner)  Assessment/Plan 1. Gastroenteritis -resolved, but still weak and poor appetite  2. Dementia, without behavioral disturbance -had some acute delirium with phenergan use so was changed to zofran, but not requiring now -cont aricept and namenda  3. UTI (lower urinary tract infection) -completes cipro on 6/10 for this though it was probably contamination from skin flora  4. Essential hypertension -bp was low upon arrival -She was not orthostatic, but initial bp here was low when she was symptomatic BP unchanged at 132/74 sitting to standing with pulse 64 up to 72  5.  Generalized weakness -seems she is very slow to recover from gastroenteritis which is not unusual at 81 yo and frail -Place IV -Administer D51/2NS at 60cc/hr for 1 liter -Maintain IV -Obtain bmp and liver panel and amylase, lipase and inform me of results -May  need another liter with NS only if Na drops from this fluid  6.  Hyponatremia:   -Improving gradually--baseline is 137 -hopefully D51/2NS will not worsen this again, but I feel like she would benefit from some glucose in her fluids for energy  Labs/tests ordered:  Bmp, liver panel, amylase, lipase Next appt:  3 mos (but I may see her in her room next week if not improving or labs are off)  Seona Clemenson L. Berneita Sanagustin, D.O. [facility] [address], [place] [address] Cell Phone (Mon-Fri 8am-5pm):  [phone] On Call:  [phone] & follow prompts after 5pm & weekends Office Phone:  [phone] Office Fax:  [phone]

## 2015-03-04 NOTE — Addendum Note (Signed)
Addended by: Gayland Curry on: 03/04/2015 08:31 AM   Modules accepted: Level of Service

## 2015-03-15 NOTE — Telephone Encounter (Signed)
NO MESSAGE FOR DOCUMATATIO.Marland KitchenMarland KitchenMarland Kitchen

## 2015-03-31 ENCOUNTER — Encounter: Payer: Medicare Other | Admitting: Internal Medicine

## 2015-04-14 ENCOUNTER — Encounter: Payer: Self-pay | Admitting: Internal Medicine

## 2015-04-21 ENCOUNTER — Non-Acute Institutional Stay: Payer: Medicare Other | Admitting: Internal Medicine

## 2015-04-21 ENCOUNTER — Encounter: Payer: Self-pay | Admitting: Internal Medicine

## 2015-04-21 VITALS — BP 130/68 | HR 64 | Temp 97.7°F | Wt 128.0 lb

## 2015-04-21 DIAGNOSIS — I10 Essential (primary) hypertension: Secondary | ICD-10-CM | POA: Diagnosis not present

## 2015-04-21 DIAGNOSIS — N390 Urinary tract infection, site not specified: Secondary | ICD-10-CM | POA: Diagnosis not present

## 2015-04-21 DIAGNOSIS — F0390 Unspecified dementia without behavioral disturbance: Secondary | ICD-10-CM

## 2015-04-21 DIAGNOSIS — R531 Weakness: Secondary | ICD-10-CM

## 2015-04-21 DIAGNOSIS — E871 Hypo-osmolality and hyponatremia: Secondary | ICD-10-CM | POA: Diagnosis not present

## 2015-04-21 DIAGNOSIS — K529 Noninfective gastroenteritis and colitis, unspecified: Secondary | ICD-10-CM

## 2015-04-21 NOTE — Progress Notes (Signed)
Patient ID: Alexa Rowe, female   DOB: Jan 07, 1918, 81 y.o.   MRN: [id number]   Location:  [facility]  Code Status: DNR  Goals of Care:Advanced Directive information Does patient have an advance directive?: Yes, Type of Advance Directive: [place];Living will;Out of facility DNR (pink MOST or yellow form), Pre-existing out of facility DNR order (yellow form or pink MOST form): Yellow form placed in chart (order not valid for inpatient use)  Chief Complaint  Patient presents with  . Medical Management of Chronic Issues    blood pressure, dementia.  Here with son Eddie Dibbles    HPI: Patient is a 5 y.o. white female [place] resident seen in the [facility] today for med mgt of her chronic diseases.    Last I saw her in June, she'd had a UTI, gastroenteritis and hyponatremia.  Eddie Dibbles says her hydration could still be better.  Likes lemonade and coffee, low sodium V8, but has trouble drinking straight water.  Appetite is also now improved.   Just returned from a trip to the beach.  Seems happy and doing well.  Ate well at the beach for 2-3 wks before the trip.    HTN:  bp 130/68 today.  Is careful when she stands.  Not feeling dizzy when stands.    Memory continues to decline a bit.  Continues on namenda XR and aricept for this.  She does have some difficulty swallowing pills.    Takes juice-plus supplement gelatin capsules get stuck sometimes.  1/8 times approximately.  Tried putting it in applesauce, but that hasn't worked real well.  Her son tries to stay with her, but they will probably switch to a chewable.    Review of Systems:  Review of Systems  Constitutional: Positive for weight loss. Negative for fever and chills.       Improvement in weakness and fatigue  HENT: Negative for congestion.   Eyes: Negative for blurred vision.       Glasses  Respiratory: Negative for shortness of breath.   Cardiovascular: Negative for chest pain, palpitations and leg  swelling.  Gastrointestinal: Positive for constipation. Negative for nausea, vomiting, abdominal pain, diarrhea, blood in stool and melena.       Dysphagia  Genitourinary: Positive for urgency and frequency. Negative for dysuria.  Musculoskeletal: Negative for myalgias and falls.  Skin: Negative for rash.  Neurological: Negative for dizziness, loss of consciousness and headaches.  Endo/Heme/Allergies: Bruises/bleeds easily.  Psychiatric/Behavioral: Positive for memory loss. Negative for depression.    Past Medical History  Diagnosis Date  . Hypertension   . TIA (transient ischemic attack)   . Cognitive decline   . UTI (lower urinary tract infection)   . Debility   . GERD (gastroesophageal reflux disease)   . Wears glasses   . Cancer     ovarian,breast  . Stroke 1996    TIA 1996  . Hyperlipidemia   . Arthritis     rhumatoid, osteo,  . Adult failure to thrive   . Pyelonephritis   . Spondylolysis of cervical region   . Adenocarcinoma (epithelial) of ovary   . Mild aortic insufficiency   . RA (rheumatoid arthritis)   . Breast cancer     LEFT  . Insomnia   . Mitral regurgitation   . Osteopenia     Past Surgical History  Procedure Laterality Date  . Toe amputation Bilateral 05/26/12    bilateral second toe amputations  . Knee arthroscopy Left   .  Abdominal hysterectomy  1989    tah/bso-cancer-radiation  . Breast lumpectomy      left  . Eye surgery Bilateral     cataracts  . Amputation Right 05/14/2014    Procedure: RIGHT THIRD TOE AMPUTATION ;  Surgeon: Wylene Simmer, MD;  Location: [facility];  Service: Orthopedics;  Laterality: Right;    Social History:   reports that she has never smoked. She has never used smokeless tobacco. She reports that she does not drink alcohol or use illicit drugs.  Allergies  Allergen Reactions  . Methotrexate Derivatives     Medications: Patient's Medications  New Prescriptions   No medications on file  Previous  Medications   ACETAMINOPHEN (TYLENOL) 325 MG TABLET    Take 2 tablets (650 mg total) by mouth every 6 (six) hours as needed for pain or fever.   ALENDRONATE (FOSAMAX) 70 MG TABLET    Take 70 mg by mouth once a week. Take with a full glass of water on an empty stomach.   ASPIRIN EC 81 MG EC TABLET    Take 1 tablet (81 mg total) by mouth daily.   CHOLECALCIFEROL (VITAMIN D) 1000 UNITS TABLET    Take 1,000 Units by mouth. Take 2 tablets in morning and one tablet in evening   COD LIVER OIL PO    Take 1 tablet by mouth daily.    DONEPEZIL (ARICEPT) 10 MG TABLET    Take 10 mg by mouth at bedtime.   FEEDING SUPPLEMENT (ENSURE COMPLETE) LIQD    Take 237 mLs by mouth daily.   GABAPENTIN (NEURONTIN) 100 MG CAPSULE    Take 100 mg by mouth at bedtime.   HYDROCHLOROTHIAZIDE (HYDRODIURIL) 12.5 MG TABLET    Take 12.5 mg by mouth daily.   MEMANTINE (NAMENDA XR) 28 MG CP24 24 HR CAPSULE    Take 28 mg by mouth. Take one tablet daily for memory   METOPROLOL SUCCINATE (TOPROL-XL) 50 MG 24 HR TABLET    Take 50 mg by mouth daily. Take with or immediately following a meal.   MULTIPLE VITAMINS-MINERALS (MULTIVITAMIN ADULT PO)    Take by mouth. Take one daily   ONDANSETRON (ZOFRAN) 4 MG TABLET    Take 4 mg by mouth. Take one every 6 hours as needed for nausea   SENNA-DOCUSATE (SENOKOT-S) 8.6-50 MG PER TABLET    Take 1 tablet by mouth at bedtime.   SIMVASTATIN (ZOCOR) 40 MG TABLET    Take 40 mg by mouth daily.   VERAPAMIL (VERELAN PM) 180 MG 24 HR CAPSULE    Take 180 mg by mouth daily.  Modified Medications   No medications on file  Discontinued Medications   CIPROFLOXACIN (CIPRO) 250 MG TABLET    Take 250 mg by mouth 2 (two) times daily.   SACCHAROMYCES BOULARDII (FLORASTOR) 250 MG CAPSULE    Take 250 mg by mouth 2 (two) times daily.     Physical Exam: Filed Vitals:   04/21/15 1317  BP: 130/68  Pulse: 64  Temp: 97.7 F (36.5 C)  TempSrc: Oral  Weight: 128 lb (58.06 kg)  SpO2: 98%   Body mass index is  23.02 kg/(m^2). Physical Exam  Constitutional:  Thin white female sitting in transport wheelchair  Cardiovascular: Normal rate, regular rhythm, normal heart sounds and intact distal pulses.   Pulmonary/Chest: Effort normal and breath sounds normal. No respiratory distress.  Abdominal: Soft. Bowel sounds are normal. She exhibits no distension. There is no tenderness.  Musculoskeletal: Normal range  of motion.  Neurological: She is alert.  Oriented to person and place  Skin: Skin is warm and dry.     Labs reviewed: Basic Metabolic Panel:  Recent Labs  05/14/14 0804  12/24/14 02/24/15 03/01/15 03/03/15  NA 136*  < > 135* 132* 134* 138  K 3.9  < > 4.2 3.2* 4.1 3.9  CL 101  --   --   --   --   --   GLUCOSE 94  --   --   --   --   --   BUN 17  < > 17 12 14  1*  CREATININE 0.70  < > 0.8 0.8 0.8  --   < > = values in this interval not displayed. Liver Function Tests:  Recent Labs  12/24/14 03/03/15  AST 18 13  ALT 13 13  ALKPHOS 50 44   No results for input(s): LIPASE, AMYLASE in the last 8760 hours. No results for input(s): AMMONIA in the last 8760 hours. CBC:  Recent Labs  05/14/14 0804 12/24/14  WBC  --  6.6  HGB 13.6 13.4  HCT 40.0 42  PLT  --  241   Lipid Panel:  Recent Labs  12/24/14  CHOL 154  HDL 76*  LDLCALC 59  TRIG 94   Lab Results  Component Value Date   HGBA1C 5.7* 07/01/2012    Patient Care Team: Gayland Curry, DO as PCP - General (Geriatric Medicine) Wylene Simmer, MD as Consulting Physician (Orthopedic Surgery) [facility] [place], NP as Nurse Practitioner (Nurse Practitioner)  Assessment/Plan 1. Hyponatremia -resolved with hydration, recheck bmp for stability  2. Gastroenteritis -resolved  3. UTI (lower urinary tract infection) -resolved with abx  4. Generalized weakness -improved after abx and IVF, correction of sodium -requires some extra help in daytime and her son usually comes and spends time with  her, lives in [place]  5. Dementia, without behavioral disturbance Change vitamin supplement to chewable due to some increasing dysphagia Change namenda XR and aricept to namzaric to decrease pill burden due to difficulty swallowing capsules  6. Essential hypertension -bp well controlled with current regimen--verapamil, toprol, hctz   Labs/tests ordered:  Bmp next draw;  Cbc, cmp, flp before annual exam  Next appt: 6 mos for annual exam  Mammie Meras L. Dekendrick Uzelac, D.O. [facility] [address], [place] [address] Cell Phone (Mon-Fri 8am-5pm):  [phone] On Call:  [phone] & follow prompts after 5pm & weekends Office Phone:  [phone] Office Fax:  [phone]

## 2015-09-14 ENCOUNTER — Non-Acute Institutional Stay: Payer: Medicare Other | Admitting: Internal Medicine

## 2015-09-14 DIAGNOSIS — R634 Abnormal weight loss: Secondary | ICD-10-CM

## 2015-09-14 DIAGNOSIS — F0390 Unspecified dementia without behavioral disturbance: Secondary | ICD-10-CM

## 2015-09-14 DIAGNOSIS — J069 Acute upper respiratory infection, unspecified: Secondary | ICD-10-CM

## 2015-09-14 NOTE — Progress Notes (Signed)
Patient ID: Alexa Rowe, female   DOB: [birth year], 81 y.o.   MRN: [id number]   Location: [facility] Provider: Jonelle Sidle L. Mariea Clonts, D.O., C.M.D.  Code Status: DNR Goals of Care: Advanced Directive information Advanced Directives 04/25/2015  Does patient have an advance directive? Yes  Type of [place] of [place];Living will;Out of facility DNR (pink MOST or yellow form)  Does patient want to make changes to advanced directive? -  Copy of advanced directive(s) in chart? Yes  Pre-existing out of facility DNR order (yellow form or pink MOST form) Yellow form placed in chart (order not valid for inpatient use)     Chief Complaint  Patient presents with  . Acute Visit    congestion, cough, afebrile, getting robitussin; family requested I see her    HPI: Patient is a 81 y.o. female seen in her room today for an acute visit for cough, congestion at family request.  Nursing staff report she is doing well and has been afebrile and lungs clear.  She is alert and oriented.  CBGs have not been higher than usual.  She is eating and drinking the same as usual.    When seen, she does admit to her cough and congestion and was resting in bed taking an afternoon nap when I saw her.  Her cough began 5 days ago and has been gradually improving with robitussin dm.  bp wnl.  Review of Systems:  Review of Systems  Constitutional: Positive for malaise/fatigue. Negative for fever and chills.  HENT: Positive for congestion.   Respiratory: Positive for cough. Negative for shortness of breath.   Cardiovascular: Negative for chest pain.  Gastrointestinal: Negative for nausea, vomiting, abdominal pain and diarrhea.  Genitourinary: Negative for dysuria.  Musculoskeletal: Negative for falls.  Skin: Negative for itching and rash.  Neurological: Negative for dizziness, weakness and headaches.  Psychiatric/Behavioral: Positive for memory loss.    Past Medical History  Diagnosis Date    . Hypertension   . TIA (transient ischemic attack)   . Cognitive decline   . UTI (lower urinary tract infection)   . Debility   . GERD (gastroesophageal reflux disease)   . Wears glasses   . Cancer ([place])     ovarian,breast  . Stroke ([place]) 1996    TIA 1996  . Hyperlipidemia   . Arthritis     rhumatoid, osteo,  . Adult failure to thrive   . Pyelonephritis   . Spondylolysis of cervical region   . Adenocarcinoma (epithelial) of ovary ([place])   . Mild aortic insufficiency   . RA (rheumatoid arthritis) ([place])   . Breast cancer ([place])     LEFT  . Insomnia   . Mitral regurgitation   . Osteopenia     Past Surgical History  Procedure Laterality Date  . Toe amputation Bilateral 05/26/12    bilateral second toe amputations  . Knee arthroscopy Left   . Abdominal hysterectomy  1989    tah/bso-cancer-radiation  . Breast lumpectomy      left  . Eye surgery Bilateral     cataracts  . Amputation Right 05/14/2014    Procedure: RIGHT THIRD TOE AMPUTATION ;  Surgeon: Wylene Simmer, MD;  Location: [facility];  Service: Orthopedics;  Laterality: Right;    Allergies  Allergen Reactions  . Methotrexate Derivatives       Medication List       This list is accurate as of: 09/14/15 11:59 PM.  Always use your most  recent med list.               acetaminophen 325 MG tablet  Commonly known as:  TYLENOL  Take 2 tablets (650 mg total) by mouth every 6 (six) hours as needed for pain or fever.     alendronate 70 MG tablet  Commonly known as:  FOSAMAX  Take 70 mg by mouth once a week. Take with a full glass of water on an empty stomach.     aspirin 81 MG EC tablet  Take 1 tablet (81 mg total) by mouth daily.     cholecalciferol 1000 units tablet  Commonly known as:  VITAMIN D  Take 1,000 Units by mouth. Take 2 tablets in morning and one tablet in evening     COD LIVER OIL PO  Take 1 tablet by mouth daily.     donepezil 10 MG tablet  Commonly known as:  ARICEPT  Take  10 mg by mouth at bedtime.     feeding supplement (ENSURE COMPLETE) Liqd  Take 237 mLs by mouth daily.     gabapentin 100 MG capsule  Commonly known as:  NEURONTIN  Take 100 mg by mouth at bedtime.     hydrochlorothiazide 12.5 MG tablet  Commonly known as:  HYDRODIURIL  Take 12.5 mg by mouth daily.     memantine 28 MG Cp24 24 hr capsule  Commonly known as:  NAMENDA XR  Take 28 mg by mouth. Take one tablet daily for memory     metoprolol succinate 50 MG 24 hr tablet  Commonly known as:  TOPROL-XL  Take 50 mg by mouth daily. Take with or immediately following a meal.     MULTIVITAMIN ADULT PO  Take by mouth. Take one daily     senna-docusate 8.6-50 MG tablet  Commonly known as:  Senokot-S  Take 1 tablet by mouth at bedtime.     simvastatin 40 MG tablet  Commonly known as:  ZOCOR  Take 40 mg by mouth daily.     verapamil 180 MG 24 hr capsule  Commonly known as:  VERELAN PM  Take 180 mg by mouth daily.     ZOFRAN 4 MG tablet  Generic drug:  ondansetron  Take 4 mg by mouth. Take one every 6 hours as needed for nausea        Physical Exam: Filed Vitals:   09/14/15 1443  BP: 102/66  Pulse: 79  Temp: 97.5 F (36.4 C)  Resp: 20  Weight: 131 lb 12.8 oz (59.784 kg)  SpO2: 98%   Body mass index is 23.71 kg/(m^2). Physical Exam  Constitutional: No distress.  Cardiovascular: Normal rate and regular rhythm.   Murmur heard. Pulmonary/Chest: Effort normal and breath sounds normal. No respiratory distress. She has no wheezes. She has no rales.  Only a few upper airway congestive sounds, no rhonchi or wheezes, lungs cta  Abdominal: Soft. Bowel sounds are normal.  Skin: Skin is warm.  Psychiatric: She has a normal mood and affect.    Labs reviewed: Basic Metabolic Panel:  Recent Labs  12/24/14 02/24/15 03/01/15 03/03/15  NA 135* 132* 134* 138  K 4.2 3.2* 4.1 3.9  BUN 17 12 14  1*  CREATININE 0.8 0.8 0.8  --    Liver Function Tests:  Recent Labs  12/24/14  03/03/15  AST 18 13  ALT 13 13  ALKPHOS 50 44   No results for input(s): LIPASE, AMYLASE in the last 8760 hours. No results for input(s): AMMONIA  in the last 8760 hours. CBC:  Recent Labs  12/24/14  WBC 6.6  HGB 13.4  HCT 42  PLT 241   Lipid Panel:  Recent Labs  12/24/14  CHOL 154  HDL 76*  LDLCALC 59  TRIG 94   Lab Results  Component Value Date   HGBA1C 5.7* 07/01/2012    Assessment/Plan 1. Acute upper respiratory infection -push fluids, encourage po intake, requests tessalon perles instead of cough syrup so ordered 100mg  po bid for 7 days  2. Dementia, without behavioral disturbance -is on aricept and namenda XR -continue these--could change to namzaric if opt to keep both on board  3. Loss of weight -might d/c aricept at routine visit considering her weight loss unless there is another viable explanation -she has a notable ovarian and breast cancer history  Labs/tests ordered:  No new today Next appt:  10/27/2015   Lyal Husted L. Elliott Quade, D.O. [facility] [address], [place] [address] Cell Phone (Mon-Fri 8am-5pm):  [phone] On Call:  [phone] & follow prompts after 5pm & weekends Office Phone:  [phone] Office Fax:  [phone]

## 2015-10-04 HISTORY — PX: MOHS SURGERY: SHX181

## 2015-10-09 ENCOUNTER — Encounter: Payer: Self-pay | Admitting: Internal Medicine

## 2015-10-21 LAB — BASIC METABOLIC PANEL
BUN: 13 mg/dL (ref 4–21)
Creatinine: 0.8 mg/dL (ref 0.5–1.1)
Glucose: 87 mg/dL
Potassium: 4.3 mmol/L (ref 3.4–5.3)
Sodium: 131 mmol/L — AB (ref 137–147)

## 2015-10-21 LAB — LIPID PANEL
CHOLESTEROL: 134 mg/dL (ref 0–200)
HDL: 66 mg/dL (ref 35–70)
LDL Cholesterol: 54 mg/dL
Triglycerides: 71 mg/dL (ref 40–160)

## 2015-10-21 LAB — HEPATIC FUNCTION PANEL
ALT: 9 U/L (ref 7–35)
AST: 13 U/L (ref 13–35)
Alkaline Phosphatase: 72 U/L (ref 25–125)
Bilirubin, Total: 0.5 mg/dL

## 2015-10-21 LAB — CBC AND DIFFERENTIAL
HCT: 34 % — AB (ref 36–46)
HEMOGLOBIN: 11 g/dL — AB (ref 12.0–16.0)
Platelets: 397 10*3/uL (ref 150–399)
WBC: 7.5 10*3/mL

## 2015-10-27 ENCOUNTER — Encounter: Payer: Self-pay | Admitting: Internal Medicine

## 2015-10-27 ENCOUNTER — Non-Acute Institutional Stay: Payer: Medicare Other | Admitting: Internal Medicine

## 2015-10-27 VITALS — BP 110/64 | HR 68 | Temp 97.6°F | Ht 63.0 in | Wt 130.0 lb

## 2015-10-27 DIAGNOSIS — F0390 Unspecified dementia without behavioral disturbance: Secondary | ICD-10-CM

## 2015-10-27 DIAGNOSIS — I10 Essential (primary) hypertension: Secondary | ICD-10-CM | POA: Diagnosis not present

## 2015-10-27 DIAGNOSIS — E785 Hyperlipidemia, unspecified: Secondary | ICD-10-CM

## 2015-10-27 DIAGNOSIS — M899 Disorder of bone, unspecified: Secondary | ICD-10-CM

## 2015-10-27 DIAGNOSIS — R634 Abnormal weight loss: Secondary | ICD-10-CM

## 2015-10-27 DIAGNOSIS — Z0000 Encounter for general adult medical examination without abnormal findings: Secondary | ICD-10-CM | POA: Diagnosis not present

## 2015-10-27 DIAGNOSIS — M8580 Other specified disorders of bone density and structure, unspecified site: Secondary | ICD-10-CM

## 2015-10-27 DIAGNOSIS — E871 Hypo-osmolality and hyponatremia: Secondary | ICD-10-CM | POA: Diagnosis not present

## 2015-10-27 NOTE — Progress Notes (Signed)
Patient ID: Alexa Rowe, female   DOB: October 05, 1917, 82 y.o.   MRN: [id number] MMSE 27/30 passed clock drawing done 09/02/15 by AL

## 2015-10-27 NOTE — Progress Notes (Signed)
Patient ID: Alexa Rowe, female   DOB: Jan 11, 1918, 82 y.o.   MRN: [id number]    Location: [facility] Provider: Tynleigh Birt L. Mariea Clonts, D.O., C.M.D.  Code Status: DNR Goals of Care: Advanced Directive information Does patient have an advance directive?: Yes, Type of Advance Directive: [place];Out of facility DNR (pink MOST or yellow form), Pre-existing out of facility DNR order (yellow form or pink MOST form): Yellow form placed in chart (order not valid for inpatient use)  Chief Complaint  Patient presents with  . Annual Exam    Wellness exam  . Medical Management of Chronic Issues    blood pressure, dementia, CVA  . MMSE    done at [facility] 09/02/15 27/30 passed clock drawing    HPI: Patient is a 82 y.o. female seen in the office today for an annual wellness exam and med mgt of chronic diseases.   Depression screen [facility] 2/9 10/27/2015 10/07/2014  Decreased Interest 0 0  Down, Depressed, Hopeless 0 0  PHQ - 2 Score 0 0    Fall Risk  10/27/2015 10/07/2014 11/28/2012  Falls in the past year? No No -  Risk for fall due to : - - Impaired balance/gait  Risk for fall due to (comments): - - uses rolling walker, orthopedic shoe on right foot   MMSE - [place] Exam 10/27/2015  Orientation to time 2  Orientation to Place 5  Registration 3  Attention/ Calculation 5  Recall 3  Language- name 2 objects 2  Language- repeat 1  Language- follow 3 step command 3  Language- read & follow direction 1  Write a sentence 1  Copy design 1  Total score 27  passed clock drawing  Health Maintenance  Topic Date Due  . PNA vac Low Risk Adult (2 of 2 - PCV13) 10/14/2010  . INFLUENZA VACCINE  04/25/2016  . TETANUS/TDAP  10/15/2019  . DEXA SCAN  Completed  . ZOSTAVAX  Completed   Urinary incontinence?  Has to hurry to get to the bathroom.  Does make it but uses bedside commode.   Functional status?  Lives in [place].  Meds are provided.  She gets herself bathed, dressed, groomed.  A  little help putting her shoes on.   Exercise?  Goes to the exercise classes--3x a week.   Diet?  No special diet.   Vision:  Wears glasses.  20/25 vision today.  Seeing well. Reads a lot.   Hearing:  Denies hearing problems, but I've had to repeat a lot.  Hearing aides.   Dentition:  No difficulty chewing or swallowing.  Has all of her own teeth.  Goes to dentist regularly.   Pain:  Denies pain.   Down 2 lbs since last visit.  Says appetite is good.  Does drink ensure shakes--chocolate.    HTN;  Blood pressure normal.  Gets up carefully, but denies dizziness--a little balance trouble when she first gets up.    Constipation: bowels are moving well with medications.  2012--osteopenia on dexa--on ca with D and fosamax.  Had one year off fosamax from 2012-2013, is back on it currently.      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    Previously was followed by Dr. Ubaldo Glassing from ob/gyn due to prior breast and ovarian cancer.  No notes recently on consult section of her chart.  Will need breast exam next time as was  not done today (thinking she was still seeing him).   Review of Systems:  Review of Systems  Constitutional: Positive for weight loss. Negative for fever and chills.  HENT: Positive for hearing loss.        Bilateral hearing aides  Eyes: Negative for blurred vision.       Glasses  Respiratory: Negative for cough and shortness of breath.   Cardiovascular: Negative for chest pain and leg swelling.  Gastrointestinal: Negative for abdominal pain, constipation, blood in stool and melena.  Genitourinary: Positive for urgency. Negative for dysuria.       Wears depends so must have incontinence, but denies this   Musculoskeletal: Negative for myalgias, back pain, joint pain, falls and neck pain.  Skin: Negative for rash.       Multiple squamous cell ca and keratoses  Neurological: Positive for weakness. Negative for  dizziness and loss of consciousness.  Endo/Heme/Allergies: Bruises/bleeds easily.  Psychiatric/Behavioral: Positive for memory loss. Negative for depression.    Past Medical History  Diagnosis Date  . Hypertension   . TIA (transient ischemic attack)   . Cognitive decline   . UTI (lower urinary tract infection)   . Debility   . GERD (gastroesophageal reflux disease)   . Wears glasses   . Cancer ([place])     ovarian,breast  . Stroke ([place]) 1996    TIA 1996  . Hyperlipidemia   . Arthritis     rhumatoid, osteo,  . Adult failure to thrive   . Pyelonephritis   . Spondylolysis of cervical region   . 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 Mild aortic insufficiency   . RA (rheumatoid arthritis) ([place])   . Breast cancer ([place])     LEFT  . Insomnia   . Mitral regurgitation   . Osteopenia     Past Surgical History  Procedure Laterality Date  . Toe amputation Bilateral 05/26/12    bilateral second toe amputations  . Knee arthroscopy Left   . Abdominal hysterectomy  1989    tah/bso-cancer-radiation  . Breast lumpectomy      left  . Eye surgery Bilateral     cataracts  . Amputation Right 05/14/2014    Procedure: RIGHT THIRD TOE AMPUTATION ;  Surgeon: Wylene Simmer, MD;  Location: [facility];  Service: Orthopedics;  Laterality: Right;  . Mohs surgery  10/04/2015    squamous cell carcinoma left mandibular angle  Dr. Harvel Quale    Allergies  Allergen Reactions  . Methotrexate Derivatives       Medication List       This list is accurate as of: 10/27/15  3:11 PM.  Always use your most recent med list.               acetaminophen 325 MG tablet  Commonly known as:  TYLENOL  Take 2 tablets (650 mg total) by mouth every 6 (six) hours as needed for pain or fever.     alendronate 70 MG tablet  Commonly known as:  FOSAMAX  Take 70 mg by mouth once a week. Take with a full glass of water on an empty stomach.     aspirin 81 MG EC tablet  Take 1 tablet (81 mg total)  by mouth daily.     cholecalciferol 1000 units tablet  Commonly known as:  VITAMIN D  Take 1,000 Units by mouth. Take 2 tablets in morning and one tablet in evening     COD LIVER OIL PO  Take 1 tablet by mouth daily.     feeding supplement (ENSURE COMPLETE) Liqd  Take 237 mLs by mouth daily.     gabapentin 100 MG capsule  Commonly known as:  NEURONTIN  Take 100 mg by mouth at bedtime.     hydrochlorothiazide 12.5 MG tablet  Commonly known as:  HYDRODIURIL  Take 12.5 mg by mouth daily.     metoprolol succinate 50 MG 24 hr tablet  Commonly known as:  TOPROL-XL  Take 50 mg by mouth daily. Take with or immediately following a meal.     MULTIVITAMIN ADULT PO  Take by mouth. Take one daily     NAMZARIC 28-10 MG Cp24  Generic drug:  Memantine HCl-Donepezil HCl  Take one tablet daily for memory     senna-docusate 8.6-50 MG tablet  Commonly known as:  Senokot-S  Take 1 tablet by mouth at bedtime.     simvastatin 40 MG tablet  Commonly known as:  ZOCOR  Take 40 mg by mouth daily.     verapamil 180 MG 24 hr capsule  Commonly known as:  VERELAN PM  Take 180 mg by mouth daily.     ZOFRAN 4 MG tablet  Generic drug:  ondansetron  Take 4 mg by mouth. Take one every 6 hours as needed for nausea        Physical Exam: Filed Vitals:   10/27/15 1458  BP: 110/64  Pulse: 68  Temp: 97.6 F (36.4 C)  TempSrc: Oral  Height: 5\' 3"  (1.6 m)  Weight: 130 lb (58.968 kg)  SpO2: 98%   Body mass index is 23.03 kg/(m^2). Physical Exam  Constitutional: No distress.  HENT:  Head: Normocephalic and atraumatic.  Right Ear: External ear normal.  Left Ear: External ear normal.  Nose: Nose normal.  Mouth/Throat: Oropharynx is clear and moist. No oropharyngeal exudate.  Bilateral hearing aides  Eyes: Conjunctivae and EOM are normal. Pupils are equal, round, and reactive to light.  Glasses with bifocals  Neck: Normal range of motion. Neck supple. No JVD present. No thyromegaly present.    Cardiovascular: Normal rate, regular rhythm, normal heart sounds and intact distal pulses.   Pulmonary/Chest: Effort normal and breath sounds normal. No respiratory distress.  Abdominal: Soft. Bowel sounds are normal. She exhibits no distension. There is no tenderness.  Musculoskeletal: Normal range of motion. She exhibits no edema or tenderness.  Walks with rollator walker  Lymphadenopathy:    She has no cervical adenopathy.  Neurological: She is alert. She has normal reflexes. No cranial nerve deficit.  Skin: Skin is warm and dry.  Psychiatric: She has a normal mood and affect. Her behavior is normal.    Labs reviewed: Basic Metabolic Panel:  Recent Labs  02/24/15 03/01/15 03/03/15 10/21/15  NA 132* 134* 138 131*  K 3.2* 4.1 3.9 4.3  BUN 12 14 1* 13  CREATININE 0.8 0.8  --  0.8   Liver Function Tests:  Recent Labs  12/24/14 03/03/15 10/21/15  AST 18 13 13   ALT 13 13 9   ALKPHOS 50 44 72   No results for input(s): LIPASE, AMYLASE in the last 8760 hours. No results for input(s): AMMONIA in the last 8760 hours. CBC:  Recent Labs  12/24/14 10/21/15  WBC 6.6 7.5  HGB 13.4 11.0*  HCT 42 34*  PLT 241 397   Lipid Panel:  Recent Labs  12/24/14 10/21/15  CHOL 154 134  HDL 76* 66  LDLCALC 59 54  TRIG 94 71  Lab Results  Component Value Date   HGBA1C 5.7* 07/01/2012    Assessment/Plan 1. Medicare annual wellness visit, initial -see hpi  2. Dementia, without behavioral disturbance -see mmse above, cont namzaric  3. Loss of weight -only down 2 lbs this visit, says her appetite is good, cont to encourage intake and ensure supplement  4. Hyponatremia -cont to monitor, is only mild and consistent  5. Essential hypertension -bp well controlled with verapamil, toprol xl hctz, cont same  6. Hyperlipidemia -well controlled, cont cod liver oil, zocor  7. Senile osteopenia -cont vitamin D, fosamax -last bone density I see was 11/08/10 with osteopenia (unsure if  she previously had osteoporosis) -she would have some difficulty getting on the table for another bone density and would require transportation for that  Labs/tests ordered:  No new Next appt:  4 mos med mgt and breast exam (did not do thinking she had with gyn, but cannot find where she actually did as I close note)  Nuh Lipton L. Shalina Norfolk, D.O. [facility] [address], [place] [address] Cell Phone (Mon-Fri 8am-5pm):  [phone] On Call:  [phone] & follow prompts after 5pm & weekends Office Phone:  [phone] Office Fax:  [phone]

## 2016-03-01 ENCOUNTER — Non-Acute Institutional Stay: Payer: Medicare Other | Admitting: Internal Medicine

## 2016-03-01 ENCOUNTER — Encounter: Payer: Self-pay | Admitting: Internal Medicine

## 2016-03-01 VITALS — BP 100/60 | HR 62 | Temp 97.8°F | Wt 128.0 lb

## 2016-03-01 DIAGNOSIS — I10 Essential (primary) hypertension: Secondary | ICD-10-CM | POA: Diagnosis not present

## 2016-03-01 DIAGNOSIS — R531 Weakness: Secondary | ICD-10-CM

## 2016-03-01 DIAGNOSIS — F0390 Unspecified dementia without behavioral disturbance: Secondary | ICD-10-CM | POA: Diagnosis not present

## 2016-03-01 DIAGNOSIS — M899 Disorder of bone, unspecified: Secondary | ICD-10-CM

## 2016-03-01 DIAGNOSIS — M8580 Other specified disorders of bone density and structure, unspecified site: Secondary | ICD-10-CM

## 2016-03-01 DIAGNOSIS — E785 Hyperlipidemia, unspecified: Secondary | ICD-10-CM | POA: Diagnosis not present

## 2016-03-01 NOTE — Progress Notes (Signed)
Location:  [facility] Service:  Clinic (12)  Provider: Maxx Calaway L. Mariea Clonts, D.O., C.M.D.  Code Status: DNR Goals of Care:  Advanced Directives 03/01/2016  Does patient have an advance directive? Yes  Type of Paramedic of [place];Out of facility DNR (pink MOST or yellow form)  Copy of advanced directive(s) in chart? Yes  Pre-existing out of facility DNR order (yellow form or pink MOST form) Yellow form placed in chart (order not valid for inpatient use)     Chief Complaint  Patient presents with  . Medical Management of Chronic Issues    42mth follow-up    HPI: Patient is a 82 y.o. female seen today for medical management of chronic diseases.    Lost 2 lbs since last appt in feb.  Not dramatic.  Is eating well.    Alexa Rowe has no concerns herself.    Has memory loss which is reportedly stable.  BP at goal with current meds.    Remains on statin therapy for cholesterol and PVD. Past Medical History  Diagnosis Date  . Hypertension   . TIA (transient ischemic attack)   . Cognitive decline   . UTI (lower urinary tract infection)   . Debility   . GERD (gastroesophageal reflux disease)   . Wears glasses   . Cancer ([place])     ovarian,breast  . Stroke ([place]) 1996    TIA 1996  . Hyperlipidemia   . Arthritis     rhumatoid, osteo,  . Adult failure to thrive   . Pyelonephritis   . Spondylolysis of cervical region   . 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 Mild aortic insufficiency   . RA (rheumatoid arthritis) ([place])   . Breast cancer ([place])     LEFT  . Insomnia   . Mitral regurgitation   . Osteopenia     Past Surgical History  Procedure Laterality Date  . Toe amputation Bilateral 05/26/12    bilateral second toe amputations  . Knee arthroscopy Left   . Abdominal hysterectomy  1989    tah/bso-cancer-radiation  . Breast lumpectomy      left  . Eye surgery Bilateral     cataracts  . Amputation Right  05/14/2014    Procedure: RIGHT THIRD TOE AMPUTATION ;  Surgeon: Wylene Simmer, MD;  Location: [facility];  Service: Orthopedics;  Laterality: Right;  . Mohs surgery  10/04/2015    squamous cell carcinoma left mandibular angle  Dr. Harvel Quale    Allergies  Allergen Reactions  . Methotrexate Derivatives       Medication List       This list is accurate as of: 03/01/16  2:26 PM.  Always use your most recent med list.               alendronate 70 MG tablet  Commonly known as:  FOSAMAX  Take 70 mg by mouth once a week. Take with a full glass of water on an empty stomach.     aspirin 81 MG EC tablet  Take 1 tablet (81 mg total) by mouth daily.     cholecalciferol 1000 units tablet  Commonly known as:  VITAMIN D  Take 1,000 Units by mouth. Take 2 tablets in morning and one tablet in evening     COD LIVER OIL PO  Take 1 tablet by mouth daily.     gabapentin 100 MG capsule  Commonly known as:  NEURONTIN  Take  100 mg by mouth at bedtime.     hydrochlorothiazide 12.5 MG tablet  Commonly known as:  HYDRODIURIL  Take 12.5 mg by mouth daily.     metoprolol succinate 50 MG 24 hr tablet  Commonly known as:  TOPROL-XL  Take 50 mg by mouth daily. Take with or immediately following a meal.     MULTIVITAMIN ADULT PO  Take by mouth. Take one daily     NAMZARIC 28-10 MG Cp24  Generic drug:  Memantine HCl-Donepezil HCl  Take one tablet daily for memory     senna-docusate 8.6-50 MG tablet  Commonly known as:  Senokot-S  Take 1 tablet by mouth at bedtime.     simvastatin 10 MG tablet  Commonly known as:  ZOCOR  Take 10 mg by mouth daily at 6 PM.     verapamil 180 MG 24 hr capsule  Commonly known as:  VERELAN PM  Take 180 mg by mouth daily.        Review of Systems:  Review of Systems  Constitutional: Positive for weight loss. Negative for fever and chills.  HENT: Negative for congestion.   Eyes: Negative for blurred vision.  Respiratory: Negative for shortness of  breath.   Cardiovascular: Negative for chest pain.  Gastrointestinal: Positive for constipation. Negative for abdominal pain, blood in stool and melena.  Genitourinary: Negative for dysuria.  Musculoskeletal: Negative for falls.  Skin: Negative for rash.  Neurological: Negative for dizziness.  Endo/Heme/Allergies: Does not bruise/bleed easily.  Psychiatric/Behavioral: Positive for memory loss. Negative for depression. The patient is not nervous/anxious and does not have insomnia.     Health Maintenance  Topic Date Due  . PNA vac Low Risk Adult (2 of 2 - PCV13) 10/14/2010  . INFLUENZA VACCINE  04/25/2016  . TETANUS/TDAP  10/15/2019  . DEXA SCAN  Completed  . ZOSTAVAX  Completed    Physical Exam: Filed Vitals:   03/01/16 1401  BP: 100/60  Pulse: 62  Temp: 97.8 F (36.6 C)  TempSrc: Oral  Weight: 128 lb (58.06 kg)  SpO2: 95%   Body mass index is 22.68 kg/(m^2). Physical Exam  Constitutional: Alexa Rowe is oriented to person, place, and time. No distress.  Cardiovascular: Normal rate, regular rhythm, normal heart sounds and intact distal pulses.   Pulmonary/Chest: Effort normal and breath sounds normal. No respiratory distress.  Abdominal: Soft. Bowel sounds are normal. Alexa Rowe exhibits no distension. There is no tenderness.  Musculoskeletal: Normal range of motion.  Neurological: Alexa Rowe is alert and oriented to person, place, and time. No cranial nerve deficit.  But short term memory loss evident during conversation  Skin: Skin is warm and dry.  Psychiatric: Alexa Rowe has a normal mood and affect. Her behavior is normal.    Labs reviewed: Basic Metabolic Panel:  Recent Labs  03/03/15 10/21/15  NA 138 131*  K 3.9 4.3  BUN 1* 13  CREATININE  --  0.8   Liver Function Tests:  Recent Labs  03/03/15 10/21/15  AST 13 13  ALT 13 9  ALKPHOS 44 72   No results for input(s): LIPASE, AMYLASE in the last 8760 hours. No results for input(s): AMMONIA in the last 8760 hours. CBC:  Recent  Labs  10/21/15  WBC 7.5  HGB 11.0*  HCT 34*  PLT 397   Lipid Panel:  Recent Labs  10/21/15  CHOL 134  HDL 66  LDLCALC 54  TRIG 71   Lab Results  Component Value Date   HGBA1C 5.7* 07/01/2012  Assessment/Plan 1. Dementia, without behavioral disturbance -cont namzaric therapy  2. Essential hypertension -bp at goal with current meds, maintain fluid intake  3. Hyperlipidemia -cont statin therapy due to this (at goal), but primarily due to her PAD and prior amputations  4. Senile osteopenia -cont fosamax and vitamin D--I guess Alexa Rowe really had osteoporosis if Alexa Rowe's on fosamax or a Gyn started it for osteopenia back when insurance actually covered that  5. Generalized weakness -is weakening over time -cont to participate in exercise programs to maintain strength  Next appt:  6 mos med mgt  Alexa Rowe L. Aiyana Stegmann, D.O. [facility] [address], [place] [address] Cell Phone (Mon-Fri 8am-5pm):  [phone] On Call:  [phone] & follow prompts after 5pm & weekends Office Phone:  [phone] Office Fax:  [phone]

## 2016-08-29 LAB — CBC AND DIFFERENTIAL
HCT: 43 % (ref 36–46)
Hemoglobin: 13.9 g/dL (ref 12.0–16.0)
Platelets: 201 10*3/uL (ref 150–399)
WBC: 6.4 10^3/mL

## 2016-08-29 LAB — BASIC METABOLIC PANEL
BUN: 17 mg/dL (ref 4–21)
Creatinine: 0.8 mg/dL (ref 0.5–1.1)
Glucose: 97 mg/dL
Potassium: 4.1 mmol/L (ref 3.4–5.3)
Sodium: 138 mmol/L (ref 137–147)

## 2016-08-30 ENCOUNTER — Non-Acute Institutional Stay: Payer: Medicare Other | Admitting: Internal Medicine

## 2016-08-30 ENCOUNTER — Encounter: Payer: Self-pay | Admitting: Internal Medicine

## 2016-08-30 VITALS — BP 128/68 | HR 69 | Temp 97.5°F | Wt 130.0 lb

## 2016-08-30 DIAGNOSIS — I10 Essential (primary) hypertension: Secondary | ICD-10-CM

## 2016-08-30 DIAGNOSIS — I080 Rheumatic disorders of both mitral and aortic valves: Secondary | ICD-10-CM | POA: Diagnosis not present

## 2016-08-30 DIAGNOSIS — R531 Weakness: Secondary | ICD-10-CM | POA: Diagnosis not present

## 2016-08-30 DIAGNOSIS — L84 Corns and callosities: Secondary | ICD-10-CM | POA: Diagnosis not present

## 2016-08-30 DIAGNOSIS — M810 Age-related osteoporosis without current pathological fracture: Secondary | ICD-10-CM | POA: Diagnosis not present

## 2016-08-30 DIAGNOSIS — F0150 Vascular dementia without behavioral disturbance: Secondary | ICD-10-CM

## 2016-08-30 NOTE — Progress Notes (Signed)
Location:  [facility] of Service:  Clinic (12)  Provider: Kinley Ferrentino L. Mariea Clonts, D.O., C.M.D.  Code Status: DNR Goals of Care:  Advanced Directives 08/30/2016  Does Patient Have a Medical Advance Directive? Yes  Type of Advance Directive Out of facility DNR (pink MOST or yellow form);Healthcare Power of Attorney  Does patient want to make changes to medical advance directive? -  Copy of [place] in Chart? Yes  Pre-existing out of facility DNR order (yellow form or pink MOST form) Yellow form placed in chart (order not valid for inpatient use)     Chief Complaint  Patient presents with  . Medical Management of Chronic Issues    75mth follow-up, right big toe painful    HPI: Patient is a 82 y.o. female seen today for medical management of chronic diseases.    She is doing well.  Just had mmse done which did show a decline to 23/30.  Short term memory loss notably worse during visit--we reviewed mgt of her toe 3-4 times over the course of the appt.  She remains very pleasant.    Right big toe is painful--it bothers her when she puts pressure on it to stand or walk.  She's previously had her second and third toes on that foot amputated.  It appears she does not follow with podiatry at this time (no order or consult notes in chart).  Labs including cbc and bmp were reviewed with her and normal.      See ROS.  Pt will need prevnar next visit.  Previously had pneumovax in 2011.  Past Medical History:  Diagnosis Date  . Adenocarcinoma (epithelial) of ovary ([place])   . Adult failure to thrive   . Arthritis    rhumatoid, osteo,  . Breast cancer ([place])    LEFT  . Cancer (HCC)    ovarian,breast  . Cognitive decline   . Debility   . GERD (gastroesophageal reflux disease)   . Hyperlipidemia   . Hypertension   . Insomnia   . Mild aortic insufficiency   . Mitral regurgitation   . Osteopenia   . Pyelonephritis   . RA (rheumatoid  arthritis) ([place])   . Spondylolysis of cervical region   . Stroke [facility]) 1996   TIA 1996  . TIA (transient ischemic attack)   . UTI (lower urinary tract infection)   . Wears glasses     Past Surgical History:  Procedure Laterality Date  . ABDOMINAL HYSTERECTOMY  1989   tah/bso-cancer-radiation  . AMPUTATION Right 05/14/2014   Procedure: RIGHT THIRD TOE AMPUTATION ;  Surgeon: Wylene Simmer, MD;  Location: [facility];  Service: Orthopedics;  Laterality: Right;  . BREAST LUMPECTOMY     left  . EYE SURGERY Bilateral    cataracts  . KNEE ARTHROSCOPY Left   . MOHS SURGERY  10/04/2015   squamous cell carcinoma left mandibular angle  Dr. Harvel Quale  . TOE AMPUTATION Bilateral 05/26/12   bilateral second toe amputations    Allergies  Allergen Reactions  . Methotrexate Derivatives       Medication List       Accurate as of 08/30/16  1:41 PM. Always use your most recent med list.          alendronate 70 MG tablet Commonly known as:  FOSAMAX Take 70 mg by mouth once a week. Take with a full glass of water on an empty stomach.   aspirin 81 MG EC  tablet Take 1 tablet (81 mg total) by mouth daily.   cholecalciferol 1000 units tablet Commonly known as:  VITAMIN D Take 1,000 Units by mouth. Take 2 tablets in morning and one tablet in evening   COD LIVER OIL PO Take 1 tablet by mouth daily.   gabapentin 100 MG capsule Commonly known as:  NEURONTIN Take 100 mg by mouth at bedtime.   hydrochlorothiazide 12.5 MG tablet Commonly known as:  HYDRODIURIL Take 12.5 mg by mouth daily.   metoprolol succinate 50 MG 24 hr tablet Commonly known as:  TOPROL-XL Take 50 mg by mouth daily. Take with or immediately following a meal.   MULTIVITAMIN ADULT PO Take by mouth. Take one daily   NAMZARIC 28-10 MG Cp24 Generic drug:  Memantine HCl-Donepezil HCl Take one tablet daily for memory   senna-docusate 8.6-50 MG tablet Commonly known as:  Senokot-S Take 1 tablet by mouth at  bedtime.   simvastatin 10 MG tablet Commonly known as:  ZOCOR Take 10 mg by mouth daily at 6 PM.   verapamil 180 MG 24 hr capsule Commonly known as:  VERELAN PM Take 180 mg by mouth daily.       Review of Systems:  Review of Systems  Constitutional: Negative for chills and fever.  HENT: Positive for hearing loss.        Wearing hearing aides, also has difficulty with cerumen impaction  Eyes: Negative for blurred vision.       Seeing ok with her glasses  Respiratory: Negative for cough and shortness of breath.   Cardiovascular: Negative for chest pain and palpitations.  Gastrointestinal: Negative for abdominal pain, blood in stool, constipation and melena.  Genitourinary: Positive for urgency. Negative for dysuria.       Denies incontinence  Musculoskeletal: Negative for falls.  Skin: Negative for itching and rash.       Great toe pain  Neurological: Negative for dizziness and loss of consciousness.  Psychiatric/Behavioral: Positive for memory loss. Negative for depression and suicidal ideas.    Health Maintenance  Topic Date Due  . PNA vac Low Risk Adult (2 of 2 - PCV13) 10/14/2010  . TETANUS/TDAP  10/15/2019  . INFLUENZA VACCINE  Completed  . DEXA SCAN  Completed  . ZOSTAVAX  Completed    Physical Exam: Vitals:   08/30/16 1331  BP: 128/68  Pulse: 69  Temp: 97.5 F (36.4 C)  TempSrc: Oral  SpO2: 97%  Weight: 130 lb (59 kg)   Body mass index is 23.03 kg/m. Physical Exam  Constitutional: No distress.  Thin white female  Cardiovascular: Normal rate, regular rhythm and intact distal pulses.   Murmur heard. Pulmonary/Chest: Effort normal and breath sounds normal. No respiratory distress.  Musculoskeletal: Normal range of motion.  Neurological: She is alert.  Poor short term memory; last mmse 23/30  Skin: Skin is warm and dry.  Callous on right great toe; tender where great toe meets 4th toe; 2nd and 3rd toes have been amputated historically; has powder on  feet; wearing socks with sandals; came to clinic in wheelchair, uses walker   Psychiatric: She has a normal mood and affect.    Labs reviewed: Basic Metabolic Panel:  Recent Labs  10/21/15 08/29/16 0300  NA 131* 138  K 4.3 4.1  BUN 13 17  CREATININE 0.8 0.8   Liver Function Tests:  Recent Labs  10/21/15  AST 13  ALT 9  ALKPHOS 72   No results for input(s): LIPASE, AMYLASE in the last  8760 hours. No results for input(s): AMMONIA in the last 8760 hours. CBC:  Recent Labs  10/21/15 08/29/16 0300  WBC 7.5 6.4  HGB 11.0* 13.9  HCT 34* 43  PLT 397 201   Lipid Panel:  Recent Labs  10/21/15  CHOL 134  HDL 66  LDLCALC 54  TRIG 71   Lab Results  Component Value Date   HGBA1C 5.7 (H) 07/01/2012    Assessment/Plan 1. Pre-ulcerative corn or callous -toe sleeve -podiatry  2. Vascular dementia without behavioral disturbance -cont namzaric  -expect she will soon need more ADL assistance  3. Essential hypertension -bp well controlled, cont same regimen, might consider d/c hctz for urinary urgency next time  4. Weakness generalized -ongoing, uses walker, comes to clinic in wheelchair  5. Mitral valve insufficiency and aortic valve insufficiency -has murmurs, no other symptoms or related problems   6. Senile osteoporosis -on fosamax---need to research duration of therapy--should be no more than 2 years--appears it was added to her meds in January of 2016 so can be continued thru next month, then would reassess bone density if possible and consider change to prolia--last bone density on file in epic is 2012  Labs/tests ordered: no new Next appt:  11/29/2016 med mgt  Alexa Rowe L. Nayson Traweek, D.O. [facility] [address], [place] [address] Cell Phone (Mon-Fri 8am-5pm):  [phone] On Call:  [phone] & follow prompts after 5pm & weekends Office Phone:  [phone] Office Fax:  [phone]

## 2016-10-07 LAB — BASIC METABOLIC PANEL
BUN: 33 mg/dL — AB (ref 4–21)
Creatinine: 1.1 mg/dL (ref 0.5–1.1)
Glucose: 74 mg/dL
Potassium: 3.8 mmol/L (ref 3.4–5.3)
Sodium: 136 mmol/L — AB (ref 137–147)

## 2016-10-09 LAB — BASIC METABOLIC PANEL
BUN: 22 mg/dL — AB (ref 4–21)
Creatinine: 0.7 mg/dL (ref 0.5–1.1)
Glucose: 88 mg/dL
Potassium: 3.5 mmol/L (ref 3.4–5.3)
Sodium: 136 mmol/L — AB (ref 137–147)

## 2016-11-29 ENCOUNTER — Encounter: Payer: Self-pay | Admitting: Internal Medicine

## 2016-11-29 ENCOUNTER — Non-Acute Institutional Stay: Payer: Medicare Other | Admitting: Internal Medicine

## 2016-11-29 VITALS — BP 130/70 | HR 71 | Temp 98.1°F | Wt 126.0 lb

## 2016-11-29 DIAGNOSIS — F0150 Vascular dementia without behavioral disturbance: Secondary | ICD-10-CM

## 2016-11-29 DIAGNOSIS — I10 Essential (primary) hypertension: Secondary | ICD-10-CM | POA: Diagnosis not present

## 2016-11-29 DIAGNOSIS — R634 Abnormal weight loss: Secondary | ICD-10-CM

## 2016-11-29 DIAGNOSIS — R531 Weakness: Secondary | ICD-10-CM | POA: Diagnosis not present

## 2016-11-29 DIAGNOSIS — L84 Corns and callosities: Secondary | ICD-10-CM | POA: Diagnosis not present

## 2016-11-29 DIAGNOSIS — M810 Age-related osteoporosis without current pathological fracture: Secondary | ICD-10-CM

## 2016-11-29 NOTE — Progress Notes (Signed)
Location:  Occupational psychologist of Service:  Clinic (12)  Provider: Lova Urbieta L. Mariea Clonts, D.O., C.M.D.  Code Status: DNR Goals of Care:  Advanced Directives 11/29/2016  Does Patient Have a Medical Advance Directive? Yes  Type of Advance Directive Out of facility DNR (pink MOST or yellow form);Healthcare Power of Attorney  Does patient want to make changes to medical advance directive? -  Copy of [place] in Chart? Yes  Pre-existing out of facility DNR order (yellow form or pink MOST form) Yellow form placed in chart (order not valid for inpatient use)     Chief Complaint  Patient presents with  . Medical Management of Chronic Issues    74mth follow-up    HPI: Patient is a 83 y.o. female seen today for medical management of chronic diseases.   Is alone for her visit today, denies any problems or concerns. Has history of vascular dementia, living in [place]. Uses wheelchair, denies any falls. Right toe is better, but still has some pain to right great toe. Doesn't think she sees a foot doctor. She doesn't do much walking. Has vascular dementia, with MMSE 23/30 as of last time. Denies new problems with memory. BP at goal with medications.   Had labs in December that were good. Has lost 4 lbs since December. Denies problems swallowing but has chronic cough.     Past Medical History:  Diagnosis Date  . Adenocarcinoma (epithelial) of ovary ([place])   . Adult failure to thrive   . Arthritis    rhumatoid, osteo,  . Breast cancer ([place])    LEFT  . Cancer (HCC)    ovarian,breast  . Cognitive decline   . Debility   . GERD (gastroesophageal reflux disease)   . Hyperlipidemia   . Hypertension   . Insomnia   . Mild aortic insufficiency   . Mitral regurgitation   . Osteopenia   . Pyelonephritis   . RA (rheumatoid arthritis) ([place])   . Spondylolysis of cervical region   . Stroke [facility]) 1996   TIA 1996  . TIA (transient ischemic attack)   . UTI (lower  urinary tract infection)   . Wears glasses     Past Surgical History:  Procedure Laterality Date  . ABDOMINAL HYSTERECTOMY  1989   tah/bso-cancer-radiation  . AMPUTATION Right 05/14/2014   Procedure: RIGHT THIRD TOE AMPUTATION ;  Surgeon: Wylene Simmer, MD;  Location: [facility];  Service: Orthopedics;  Laterality: Right;  . BREAST LUMPECTOMY     left  . EYE SURGERY Bilateral    cataracts  . KNEE ARTHROSCOPY Left   . MOHS SURGERY  10/04/2015   squamous cell carcinoma left mandibular angle  Dr. Harvel Quale  . TOE AMPUTATION Bilateral 05/26/12   bilateral second toe amputations    Allergies  Allergen Reactions  . Methotrexate Derivatives     Allergies as of 11/29/2016      Reactions   Methotrexate Derivatives       Medication List       Accurate as of 11/29/16  2:31 PM. Always use your most recent med list.          alendronate 70 MG tablet Commonly known as:  FOSAMAX Take 70 mg by mouth once a week. Take with a full glass of water on an empty stomach.   aspirin 81 MG EC tablet Take 1 tablet (81 mg total) by mouth daily.   cholecalciferol 1000 units tablet Commonly known as:  VITAMIN D Take 1,000 Units by mouth. Take 2 tablets in morning and one tablet in evening   COD LIVER OIL PO Take 1 tablet by mouth daily.   gabapentin 100 MG capsule Commonly known as:  NEURONTIN Take 100 mg by mouth at bedtime.   hydrochlorothiazide 12.5 MG tablet Commonly known as:  HYDRODIURIL Take 12.5 mg by mouth daily.   metoprolol succinate 50 MG 24 hr tablet Commonly known as:  TOPROL-XL Take 50 mg by mouth daily. Take with or immediately following a meal.   MULTIVITAMIN ADULT PO Take by mouth. Take one daily   NAMZARIC 28-10 MG Cp24 Generic drug:  Memantine HCl-Donepezil HCl Take one tablet daily for memory   senna-docusate 8.6-50 MG tablet Commonly known as:  Senokot-S Take 1 tablet by mouth at bedtime.   simvastatin 10 MG tablet Commonly known as:  ZOCOR Take  10 mg by mouth daily at 6 PM.   verapamil 180 MG 24 hr capsule Commonly known as:  VERELAN PM Take 180 mg by mouth daily.       Review of Systems:  Review of Systems  Constitutional: Positive for weight loss. Negative for chills, fever and malaise/fatigue.  HENT: Positive for hearing loss.   Eyes: Negative for blurred vision.       Glasses  Respiratory: Positive for cough. Negative for sputum production, shortness of breath and wheezing.   Cardiovascular: Negative for chest pain, palpitations and leg swelling.  Gastrointestinal: Negative for abdominal pain, blood in stool, constipation and melena.  Genitourinary: Negative for dysuria.  Musculoskeletal: Negative for falls.  Skin: Negative for itching and rash.       Toe callous improved  Neurological: Positive for weakness. Negative for dizziness and loss of consciousness.  Endo/Heme/Allergies: Bruises/bleeds easily.  Psychiatric/Behavioral: Positive for memory loss.    Health Maintenance  Topic Date Due  . PNA vac Low Risk Adult (2 of 2 - PCV13) 10/14/2010  . TETANUS/TDAP  10/15/2019  . INFLUENZA VACCINE  Completed  . DEXA SCAN  Completed    Physical Exam: Vitals:   11/29/16 1400  BP: 130/70  Pulse: 71  Temp: 98.1 F (36.7 C)  TempSrc: Oral  SpO2: 95%  Weight: 126 lb (57.2 kg)   Body mass index is 22.32 kg/m. Physical Exam  Constitutional: No distress.  Frail white female seated in wheelchair  Cardiovascular: Normal rate and regular rhythm.   Murmur heard. Pulmonary/Chest: Effort normal and breath sounds normal. No respiratory distress.  Abdominal: Soft. Bowel sounds are normal. She exhibits no distension. There is no tenderness.  Musculoskeletal: Normal range of motion.  Neurological: She is alert. She displays normal reflexes. No sensory deficit. She exhibits normal muscle tone. Coordination normal.  Short term memory loss, pleasant lady  Skin: Skin is warm and dry.  Purplish blue feet, prior amputation of  toes, callous healing right foot  Psychiatric: She has a normal mood and affect.    Labs reviewed: Basic Metabolic Panel:  Recent Labs  08/29/16 0300  NA 138  K 4.1  BUN 17  CREATININE 0.8   Liver Function Tests: No results for input(s): AST, ALT, ALKPHOS, BILITOT, PROT, ALBUMIN in the last 8760 hours. No results for input(s): LIPASE, AMYLASE in the last 8760 hours. No results for input(s): AMMONIA in the last 8760 hours. CBC:  Recent Labs  08/29/16 0300  WBC 6.4  HGB 13.9  HCT 43  PLT 201   Lipid Panel: No results for input(s): CHOL, HDL, LDLCALC, TRIG, CHOLHDL, LDLDIRECT  in the last 8760 hours. Lab Results  Component Value Date   HGBA1C 5.7 (H) 07/01/2012    Assessment/Plan 1. Vascular dementia without behavioral disturbance -cont AL support, namzaric, vascular risk reduction with baby asa, statin, bp control  2. Pre-ulcerative corn or callous -improved since last visit, pt feels better, seeing podiatry  3. Weakness generalized -using wheelchair more, PT eval and tx  4. Essential hypertension -bp at goal with current therapy with hctz, verapamil, toprol, cont same and monitor -could consider stopped diuretic to prevent falls and dehydation in future  5. Senile osteoporosis -cont fosamax, vitamin D supplement--ideally 2000 units  6. Loss of weight -suspect some degree of aspiration with chronic coughing, ST eval previously done -no nursing notes about this or her cough so far this month  Labs/tests ordered:  No orders of the defined types were placed in this encounter.  Next appt:  04/04/2017   Odelle Kosier L. Jhane Lorio, D.O. [facility] [address], [place] [address] Cell Phone (Mon-Fri 8am-5pm):  [phone] On Call:  [phone] & follow prompts after 5pm & weekends Office Phone:  [phone] Office Fax:  [phone]

## 2016-12-20 LAB — BASIC METABOLIC PANEL
BUN: 18 mg/dL (ref 4–21)
Creatinine: 0.7 mg/dL (ref 0.5–1.1)
Glucose: 100 mg/dL
Potassium: 3.6 mmol/L (ref 3.4–5.3)
Sodium: 128 mmol/L — AB (ref 137–147)

## 2016-12-22 LAB — BASIC METABOLIC PANEL WITH GFR
BUN: 20 mg/dL (ref 4–21)
Creatinine: 0.6 mg/dL (ref 0.5–1.1)
Glucose: 82 mg/dL
Potassium: 3.9 mmol/L (ref 3.4–5.3)
Sodium: 129 mmol/L — AB (ref 137–147)

## 2016-12-23 LAB — BASIC METABOLIC PANEL
BUN: 14 mg/dL (ref 4–21)
Creatinine: 0.6 mg/dL (ref 0.5–1.1)
Glucose: 85 mg/dL
Potassium: 3.1 mmol/L — AB (ref 3.4–5.3)
Sodium: 134 mmol/L — AB (ref 137–147)

## 2016-12-25 LAB — BASIC METABOLIC PANEL
BUN: 14 (ref 4–21)
Creatinine: 0.6 (ref 0.5–1.1)
Glucose: 93
Potassium: 3.7 (ref 3.4–5.3)
Sodium: 136 — AB (ref 137–147)

## 2016-12-26 ENCOUNTER — Encounter: Payer: Self-pay | Admitting: Internal Medicine

## 2017-03-21 ENCOUNTER — Non-Acute Institutional Stay: Payer: Medicare Other

## 2017-03-21 DIAGNOSIS — Z0000 Encounter for general adult medical examination without abnormal findings: Secondary | ICD-10-CM

## 2017-03-21 NOTE — Patient Instructions (Addendum)
Alexa Rowe , Thank you for taking time to come for your Medicare Wellness Visit. I appreciate your ongoing commitment to your health goals. Please review the following plan we discussed and let me know if I can assist you in the future.   Screening recommendations/referrals: Colonoscopy up to date, long term pt Mammogram up to date, long term pt Bone Density up to date Recommended yearly ophthalmology/optometry visit for glaucoma screening and checkup Recommended yearly dental visit for hygiene and checkup  Vaccinations: Influenza vaccine up to date. Due 07/13/17 Pneumococcal vaccine up to date Tdap vaccine up to date. Due 10/15/2019 Shingles vaccine not in records  Advanced directives: In Chart  Conditions/risks identified: none  Next appointment: 04/04/2017 @ 1:30pm Dr. Mariea Clonts   Preventive Care 65 Years and Older, Female Preventive care refers to lifestyle choices and visits with your health care provider that can promote health and wellness. What does preventive care include?  A yearly physical exam. This is also called an annual well check.  Dental exams once or twice a year.  Routine eye exams. Ask your health care provider how often you should have your eyes checked.  Personal lifestyle choices, including:  Daily care of your teeth and gums.  Regular physical activity.  Eating a healthy diet.  Avoiding tobacco and drug use.  Limiting alcohol use.  Practicing safe sex.  Taking low-dose aspirin every day.  Taking vitamin and mineral supplements as recommended by your health care provider. What happens during an annual well check? The services and screenings done by your health care provider during your annual well check will depend on your age, overall health, lifestyle risk factors, and family history of disease. Counseling  Your health care provider may ask you questions about your:  Alcohol use.  Tobacco use.  Drug use.  Emotional well-being.  Home  and relationship well-being.  Sexual activity.  Eating habits.  History of falls.  Memory and ability to understand (cognition).  Work and work Statistician.  Reproductive health. Screening  You may have the following tests or measurements:  Height, weight, and BMI.  Blood pressure.  Lipid and cholesterol levels. These may be checked every 5 years, or more frequently if you are over 71 years old.  Skin check.  Lung cancer screening. You may have this screening every year starting at age 54 if you have a 30-pack-year history of smoking and currently smoke or have quit within the past 15 years.  Fecal occult blood test (FOBT) of the stool. You may have this test every year starting at age 69.  Flexible sigmoidoscopy or colonoscopy. You may have a sigmoidoscopy every 5 years or a colonoscopy every 10 years starting at age 14.  Hepatitis C blood test.  Hepatitis B blood test.  Sexually transmitted disease (STD) testing.  Diabetes screening. This is done by checking your blood sugar (glucose) after you have not eaten for a while (fasting). You may have this done every 1-3 years.  Bone density scan. This is done to screen for osteoporosis. You may have this done starting at age 41.  Mammogram. This may be done every 1-2 years. Talk to your health care provider about how often you should have regular mammograms. Talk with your health care provider about your test results, treatment options, and if necessary, the need for more tests. Vaccines  Your health care provider may recommend certain vaccines, such as:  Influenza vaccine. This is recommended every year.  Tetanus, diphtheria, and acellular pertussis (Tdap, Td)  vaccine. You may need a Td booster every 10 years.  Zoster vaccine. You may need this after age 74.  Pneumococcal 13-valent conjugate (PCV13) vaccine. One dose is recommended after age 27.  Pneumococcal polysaccharide (PPSV23) vaccine. One dose is recommended  after age 14. Talk to your health care provider about which screenings and vaccines you need and how often you need them. This information is not intended to replace advice given to you by your health care provider. Make sure you discuss any questions you have with your health care provider. Document Released: 10/08/2015 Document Revised: 05/31/2016 Document Reviewed: 07/13/2015 Elsevier Interactive Patient Education  2017 [place] Prevention in the Home Falls can cause injuries. They can happen to people of all ages. There are many things you can do to make your home safe and to help prevent falls. What can I do on the outside of my home?  Regularly fix the edges of walkways and driveways and fix any cracks.  Remove anything that might make you trip as you walk through a door, such as a raised step or threshold.  Trim any bushes or trees on the path to your home.  Use bright outdoor lighting.  Clear any walking paths of anything that might make someone trip, such as rocks or tools.  Regularly check to see if handrails are loose or broken. Make sure that both sides of any steps have handrails.  Any raised decks and porches should have guardrails on the edges.  Have any leaves, snow, or ice cleared regularly.  Use sand or salt on walking paths during winter.  Clean up any spills in your garage right away. This includes oil or grease spills. What can I do in the bathroom?  Use night lights.  Install grab bars by the toilet and in the tub and shower. Do not use towel bars as grab bars.  Use non-skid mats or decals in the tub or shower.  If you need to sit down in the shower, use a plastic, non-slip stool.  Keep the floor dry. Clean up any water that spills on the floor as soon as it happens.  Remove soap buildup in the tub or shower regularly.  Attach bath mats securely with double-sided non-slip rug tape.  Do not have throw rugs and other things on the floor  that can make you trip. What can I do in the bedroom?  Use night lights.  Make sure that you have a light by your bed that is easy to reach.  Do not use any sheets or blankets that are too big for your bed. They should not hang down onto the floor.  Have a firm chair that has side arms. You can use this for support while you get dressed.  Do not have throw rugs and other things on the floor that can make you trip. What can I do in the kitchen?  Clean up any spills right away.  Avoid walking on wet floors.  Keep items that you use a lot in easy-to-reach places.  If you need to reach something above you, use a strong step stool that has a grab bar.  Keep electrical cords out of the way.  Do not use floor polish or wax that makes floors slippery. If you must use wax, use non-skid floor wax.  Do not have throw rugs and other things on the floor that can make you trip. What can I do with my stairs?  Do not leave  any items on the stairs.  Make sure that there are handrails on both sides of the stairs and use them. Fix handrails that are broken or loose. Make sure that handrails are as long as the stairways.  Check any carpeting to make sure that it is firmly attached to the stairs. Fix any carpet that is loose or worn.  Avoid having throw rugs at the top or bottom of the stairs. If you do have throw rugs, attach them to the floor with carpet tape.  Make sure that you have a light switch at the top of the stairs and the bottom of the stairs. If you do not have them, ask someone to add them for you. What else can I do to help prevent falls?  Wear shoes that:  Do not have high heels.  Have rubber bottoms.  Are comfortable and fit you well.  Are closed at the toe. Do not wear sandals.  If you use a stepladder:  Make sure that it is fully opened. Do not climb a closed stepladder.  Make sure that both sides of the stepladder are locked into place.  Ask someone to hold it  for you, if possible.  Clearly mark and make sure that you can see:  Any grab bars or handrails.  First and last steps.  Where the edge of each step is.  Use tools that help you move around (mobility aids) if they are needed. These include:  Canes.  Walkers.  Scooters.  Crutches.  Turn on the lights when you go into a dark area. Replace any light bulbs as soon as they burn out.  Set up your furniture so you have a clear path. Avoid moving your furniture around.  If any of your floors are uneven, fix them.  If there are any pets around you, be aware of where they are.  Review your medicines with your doctor. Some medicines can make you feel dizzy. This can increase your chance of falling. Ask your doctor what other things that you can do to help prevent falls. This information is not intended to replace advice given to you by your health care provider. Make sure you discuss any questions you have with your health care provider. Document Released: 07/08/2009 Document Revised: 02/17/2016 Document Reviewed: 10/16/2014 Elsevier Interactive Patient Education  2017 [employer].

## 2017-03-21 NOTE — Progress Notes (Signed)
Subjective:   Alexa Rowe is a 83 y.o. female who presents for an Initial Medicare Annual Wellness Visit at [facility]       Objective:    Today's Vitals   03/21/17 1315  BP: 128/70  Pulse: 66  Temp: 97.4 F (36.3 C)  TempSrc: Oral  SpO2: 96%  Weight: 126 lb (57.2 kg)  Height: 5\' 3"  (1.6 m)   Body mass index is 22.32 kg/m.   Current Medications (verified) Outpatient Encounter Prescriptions as of 03/21/2017  Medication Sig  . alendronate (FOSAMAX) 70 MG tablet Take 70 mg by mouth once a week. Take with a full glass of water on an empty stomach.  Marland Kitchen aspirin EC 81 MG EC tablet Take 1 tablet (81 mg total) by mouth daily.  . cholecalciferol (VITAMIN D) 1000 UNITS tablet Take 1,000 Units by mouth. Take 2 tablets in morning and one tablet in evening  . COD LIVER OIL PO Take 1 tablet by mouth daily.   Marland Kitchen gabapentin (NEURONTIN) 100 MG capsule Take 100 mg by mouth at bedtime.  . metoprolol succinate (TOPROL-XL) 50 MG 24 hr tablet Take 50 mg by mouth daily. Take with or immediately following a meal.  . Multiple Vitamins-Minerals (MULTIVITAMIN ADULT PO) Take by mouth. Take one daily  . NAMZARIC 28-10 MG CP24 Take one tablet daily for memory  . senna-docusate (SENOKOT-S) 8.6-50 MG per tablet Take 1 tablet by mouth at bedtime.  . simvastatin (ZOCOR) 10 MG tablet Take 10 mg by mouth daily at 6 PM.   . verapamil (VERELAN PM) 180 MG 24 hr capsule Take 180 mg by mouth daily.  . hydrochlorothiazide (HYDRODIURIL) 12.5 MG tablet Take 12.5 mg by mouth daily.   No facility-administered encounter medications on file as of 03/21/2017.     Allergies (verified) Methotrexate derivatives   History: Past Medical History:  Diagnosis Date  . Adenocarcinoma (epithelial) of ovary ([place])   . Adult failure to thrive   . Arthritis    rhumatoid, osteo,  . Breast cancer ([place])    LEFT  . Cancer (HCC)    ovarian,breast  . Cognitive decline   . Debility   . GERD  (gastroesophageal reflux disease)   . Hyperlipidemia   . Hypertension   . Insomnia   . Mild aortic insufficiency   . Mitral regurgitation   . Osteopenia   . Pyelonephritis   . RA (rheumatoid arthritis) ([place])   . Spondylolysis of cervical region   . Stroke [facility]) 1996   TIA 1996  . TIA (transient ischemic attack)   . UTI (lower urinary tract infection)   . Wears glasses    Past Surgical History:  Procedure Laterality Date  . ABDOMINAL HYSTERECTOMY  1989   tah/bso-cancer-radiation  . AMPUTATION Right 05/14/2014   Procedure: RIGHT THIRD TOE AMPUTATION ;  Surgeon: Wylene Simmer, MD;  Location: [place];  Service: Orthopedics;  Laterality: Right;  . BREAST LUMPECTOMY     left  . EYE SURGERY Bilateral    cataracts  . KNEE ARTHROSCOPY Left   . MOHS SURGERY  10/04/2015   squamous cell carcinoma left mandibular angle  Dr. Harvel Quale  . TOE AMPUTATION Bilateral 05/26/12   bilateral second toe amputations   Family History  Problem Relation Age of Onset  . Heart disease Father    Social History   Occupational History  . retired Product manager    Social History Main Topics  . Smoking status: Never Smoker  .  Smokeless tobacco: Never Used  . Alcohol use No  . Drug use: No  . Sexual activity: No    Tobacco Counseling Counseling given: Not Answered   Activities of Daily Living In your present state of health, do you have any difficulty performing the following activities: 03/21/2017  Hearing? Y  Vision? N  Difficulty concentrating or making decisions? Y  Walking or climbing stairs? Y  Dressing or bathing? Y  Doing errands, shopping? Y  Preparing Food and eating ? Y  Using the Toilet? Y  In the past six months, have you accidently leaked urine? N  Do you have problems with loss of bowel control? N  Managing your Medications? Y  Managing your Finances? Y  Housekeeping or managing your Housekeeping? Y  Some recent data might be hidden    Immunizations and  Health Maintenance Immunization History  Administered Date(s) Administered  . Influenza Inj Mdck Quad Pf 07/13/2016  . Influenza-Unspecified 06/20/2010, 05/27/2012, 07/08/2015  . Pneumococcal-Unspecified 10/14/2009  . Td 10/14/2009  . Zoster 10/14/2009   There are no preventive care reminders to display for this patient.  Patient Care Team: Gayland Curry, DO as PCP - General (Geriatric Medicine) Wylene Simmer, MD as Consulting Physician (Orthopedic Surgery) Community, [facility], Carlos American, NP as Nurse Practitioner (Nurse Practitioner)  Indicate any recent Medical Services you may have received from other than [facility] providers in the past year (date may be approximate).     Assessment:   This is a routine wellness examination for [facility].   Hearing/Vision screen No exam data present  Dietary issues and exercise activities discussed: Current Exercise Habits: Structured exercise class, Time (Minutes): 60, Frequency (Times/Week): 3, Weekly Exercise (Minutes/Week): 180, Intensity: Mild, Exercise limited by: None identified  Goals    . Maintain Lifestyle          Pt will maintain lifestyle.       Depression Screen PHQ 2/9 Scores 03/21/2017 08/30/2016 10/27/2015 10/07/2014  PHQ - 2 Score 0 0 0 0    Fall Risk Fall Risk  03/21/2017 08/30/2016 10/27/2015 10/07/2014 11/28/2012  Falls in the past year? No No No No -  Risk for fall due to : - - - - Impaired balance/gait  Risk for fall due to (comments): - - - - uses rolling walker, orthopedic shoe on right foot    Cognitive Function: MMSE - Mini Mental State Exam 02/15/2017 10/27/2015  Orientation to time 1 2  Orientation to Place 5 5  Registration 3 3  Attention/ Calculation 5 5  Recall 3 3  Language- name 2 objects 2 2  Language- repeat 1 1  Language- follow 3 step command 3 3  Language- read & follow direction 1 1  Write a sentence 1 1  Copy design 1 1  Total score 26 27        Screening Tests Health  Maintenance  Topic Date Due  . INFLUENZA VACCINE  04/25/2017  . TETANUS/TDAP  10/15/2019  . DEXA SCAN  Completed  . PNA vac Low Risk Adult  Completed      Plan:    I have personally reviewed and addressed the Medicare Annual Wellness questionnaire and have noted the following in the patient's chart:  A. Medical and social history B. Use of alcohol, tobacco or illicit drugs  C. Current medications and supplements D. Functional ability and status E.  Nutritional status F.  Physical activity G. Advance directives H. List of other physicians I.  Hospitalizations,  surgeries, and ER visits in previous 12 months J.  [place] to include hearing, vision, cognitive, depression L. Referrals and appointments - none  In addition, I have reviewed and discussed with patient certain preventive protocols, quality metrics, and best practice recommendations. A written personalized care plan for preventive services as well as general preventive health recommendations were provided to patient.  See attached scanned questionnaire for additional information.   Signed,   Rich Reining, RN Nurse Health Advisor   Quick Notes   Health Maintenance: Up to date     Abnormal Screen: MMSE 26/30 on 03/21/17. Passed clock     Patient Concerns: None     Nurse Concerns: None

## 2017-04-04 ENCOUNTER — Non-Acute Institutional Stay: Payer: Medicare Other | Admitting: Internal Medicine

## 2017-04-04 ENCOUNTER — Encounter: Payer: Self-pay | Admitting: Internal Medicine

## 2017-04-04 VITALS — BP 140/80 | HR 63 | Temp 98.0°F | Wt 121.0 lb

## 2017-04-04 DIAGNOSIS — R634 Abnormal weight loss: Secondary | ICD-10-CM

## 2017-04-04 DIAGNOSIS — M069 Rheumatoid arthritis, unspecified: Secondary | ICD-10-CM | POA: Diagnosis not present

## 2017-04-04 DIAGNOSIS — F0150 Vascular dementia without behavioral disturbance: Secondary | ICD-10-CM | POA: Diagnosis not present

## 2017-04-04 DIAGNOSIS — I10 Essential (primary) hypertension: Secondary | ICD-10-CM | POA: Diagnosis not present

## 2017-04-04 DIAGNOSIS — I080 Rheumatic disorders of both mitral and aortic valves: Secondary | ICD-10-CM

## 2017-04-04 DIAGNOSIS — Z7189 Other specified counseling: Secondary | ICD-10-CM | POA: Diagnosis not present

## 2017-04-04 DIAGNOSIS — R531 Weakness: Secondary | ICD-10-CM | POA: Diagnosis not present

## 2017-04-04 NOTE — Progress Notes (Signed)
Location:  [facility] clinic Provider:  Minnah Llamas L. Mariea Clonts, D.O., C.M.D.  Code Status: DNR, MOST--30 mins spent reviewing her goals of care and completing a MOST form for her chart.  Her son, Broadus John, was present for the visit also.  We determined DNR, limited interventions with hospitalization in reversible conditions that require hospitalization like broken bones, but keeping in house for life-threatening conditions and maintaining comfort, fluids and abx for a defined trial period, no feeding tube  Goals of Care:  Advanced Directives 04/04/2017  Does Patient Have a Medical Advance Directive? Yes  Type of Advance Directive Out of facility DNR (pink MOST or yellow form);Healthcare Power of Attorney  Does patient want to make changes to medical advance directive? -  Copy of [place] in Chart? Yes  Pre-existing out of facility DNR order (yellow form or pink MOST form) Yellow form placed in chart (order not valid for inpatient use)   Chief Complaint  Patient presents with  . Medical Management of Chronic Issues    30mth follow-up    HPI: Patient is a 83 y.o. female seen today for medical management of chronic diseases.    Her son is with her due to concerns with her understanding him on the phone.  Her last MMSE was 26/30.   MMSE - Mini Mental State Exam 02/15/2017 10/27/2015  Orientation to time 1 2  Orientation to Place 5 5  Registration 3 3  Attention/ Calculation 5 5  Recall 3 3  Language- name 2 objects 2 2  Language- repeat 1 1  Language- follow 3 step command 3 3  Language- read & follow direction 1 1  Write a sentence 1 1  Copy design 1 1  Total score 26 27   Weight is down 5 lbs since last appt June 27th.  Weighed 118 lbs on 7/1 in [place].  She has been gradually losing weight, but eats well.  She reports a good appetite.  They are not seeking an aggressive workup for her weight loss.  She has requested to be comfortable--son agrees.  Past Medical History:  Diagnosis  Date  . Adenocarcinoma (epithelial) of ovary ([place])   . Adult failure to thrive   . Arthritis    rhumatoid, osteo,  . Breast cancer ([place])    LEFT  . Cancer (HCC)    ovarian,breast  . Cognitive decline   . Debility   . GERD (gastroesophageal reflux disease)   . Hyperlipidemia   . Hypertension   . Insomnia   . Mild aortic insufficiency   . Mitral regurgitation   . Osteopenia   . Pyelonephritis   . RA (rheumatoid arthritis) ([place])   . Spondylolysis of cervical region   . Stroke [facility]) 1996   TIA 1996  . TIA (transient ischemic attack)   . UTI (lower urinary tract infection)   . Wears glasses     Past Surgical History:  Procedure Laterality Date  . ABDOMINAL HYSTERECTOMY  1989   tah/bso-cancer-radiation  . AMPUTATION Right 05/14/2014   Procedure: RIGHT THIRD TOE AMPUTATION ;  Surgeon: Wylene Simmer, MD;  Location: [place];  Service: Orthopedics;  Laterality: Right;  . BREAST LUMPECTOMY     left  . EYE SURGERY Bilateral    cataracts  . KNEE ARTHROSCOPY Left   . MOHS SURGERY  10/04/2015   squamous cell carcinoma left mandibular angle  Dr. Harvel Quale  . TOE AMPUTATION Bilateral 05/26/12   bilateral second toe amputations  Allergies  Allergen Reactions  . Methotrexate Derivatives     Allergies as of 04/04/2017      Reactions   Methotrexate Derivatives       Medication List       Accurate as of 04/04/17  1:55 PM. Always use your most recent med list.          alendronate 70 MG tablet Commonly known as:  FOSAMAX Take 70 mg by mouth once a week. Take with a full glass of water on an empty stomach.   aspirin 81 MG EC tablet Take 1 tablet (81 mg total) by mouth daily.   carbamide peroxide 6.5 % OTIC solution Commonly known as:  DEBROX Place 5 drops into both ears 2 (two) times daily.   cholecalciferol 1000 units tablet Commonly known as:  VITAMIN D Take 1,000 Units by mouth. Take 2 tablets in morning and one tablet in evening   COD LIVER OIL  PO Take 1 tablet by mouth daily.   feeding supplement Liqd Take 1 Container by mouth 2 (two) times daily between meals.   gabapentin 100 MG capsule Commonly known as:  NEURONTIN Take 100 mg by mouth at bedtime.   metoprolol succinate 50 MG 24 hr tablet Commonly known as:  TOPROL-XL Take 50 mg by mouth daily. Take with or immediately following a meal.   MULTIVITAMIN ADULT PO Take by mouth. Take one daily   NAMZARIC 28-10 MG Cp24 Generic drug:  Memantine HCl-Donepezil HCl Take one tablet daily for memory   senna-docusate 8.6-50 MG tablet Commonly known as:  Senokot-S Take 1 tablet by mouth at bedtime.   simvastatin 10 MG tablet Commonly known as:  ZOCOR Take 10 mg by mouth daily at 6 PM.   verapamil 180 MG 24 hr capsule Commonly known as:  VERELAN PM Take 180 mg by mouth daily.       Review of Systems:  Review of Systems  Constitutional: Positive for weight loss. Negative for chills, fever and malaise/fatigue.  HENT: Positive for hearing loss. Negative for congestion.   Eyes: Positive for blurred vision.       Glasses, able to read  Respiratory: Negative for shortness of breath.   Cardiovascular: Positive for leg swelling. Negative for chest pain and palpitations.       Poor arterial circulation, venous insufficiency  Gastrointestinal: Negative for abdominal pain, blood in stool, constipation, diarrhea and melena.  Genitourinary: Negative for dysuria.  Musculoskeletal: Positive for joint pain. Negative for falls.  Skin: Negative for itching and rash.  Neurological: Positive for weakness. Negative for dizziness and loss of consciousness.  Endo/Heme/Allergies: Bruises/bleeds easily.  Psychiatric/Behavioral: Positive for memory loss. Negative for depression. The patient is not nervous/anxious.     Health Maintenance  Topic Date Due  . INFLUENZA VACCINE  04/25/2017  . TETANUS/TDAP  10/15/2019  . DEXA SCAN  Completed  . PNA vac Low Risk Adult  Completed     Physical Exam: Vitals:   04/04/17 1344  BP: 140/80  Pulse: 63  Temp: 98 F (36.7 C)  TempSrc: Oral  SpO2: 98%  Weight: 121 lb (54.9 kg)   Body mass index is 21.43 kg/m. Physical Exam  Constitutional: No distress.  Frail white female in wheelchair  HENT:  Very HOH, has tiny auditory canals   Cardiovascular: Normal rate and regular rhythm.   Murmur heard. Diminished pedal pulses, purple discoloration to swollen feet  Pulmonary/Chest: Effort normal and breath sounds normal. No respiratory distress.  Abdominal: Soft. Bowel sounds  are normal. She exhibits no distension. There is no tenderness.  Musculoskeletal: Normal range of motion. She exhibits no tenderness.  Neurological: She is alert.  Oriented to person, place, not time  Skin: Skin is dry. No rash noted.  Psychiatric: She has a normal mood and affect.    Labs reviewed: Basic Metabolic Panel:  Recent Labs  12/22/16 0600 12/23/16 0600 12/25/16 0609  NA 129* 134* 136*  K 3.9 3.1* 3.7  BUN 20 14 14   CREATININE 0.6 0.6 0.6   Liver Function Tests: No results for input(s): AST, ALT, ALKPHOS, BILITOT, PROT, ALBUMIN in the last 8760 hours. No results for input(s): LIPASE, AMYLASE in the last 8760 hours. No results for input(s): AMMONIA in the last 8760 hours. CBC:  Recent Labs  08/29/16 0300  WBC 6.4  HGB 13.9  HCT 43  PLT 201   Lipid Panel: No results for input(s): CHOL, HDL, LDLCALC, TRIG, CHOLHDL, LDLDIRECT in the last 8760 hours. Lab Results  Component Value Date   HGBA1C 5.7 (H) 07/01/2012   Assessment/Plan 1. Rheumatoid arthritis involving multiple sites, unspecified rheumatoid factor presence (HCC) -chronic, involves primarily feet and hands, cont same pain regimen  2. Vascular dementia without behavioral disturbance -is progressing gradually, in AL, MOST completed today as above and copy made for chart to be scanned at office, cont secondary prevention, but not aggressive at age 64  3.  Essential hypertension -bp controlled, no changes needed  4. Weakness generalized -uses walker and wheelchair  5. Loss of weight -ongoing, reviewed with her son as in hpi, cont to encourage po intake and supplements  6. Mitral valve insufficiency and aortic valve insufficiency -stable, not aggressively pursuing more testing here, goals are comfort focused  7.  ACP held for 30 mins as at top of note; 25 mins spent on med mgt  Labs/tests ordered:   Orders Placed This Encounter  Procedures  . Basic metabolic panel    This external order was created through the Results Console.   Next appt:  08/08/2017  Myrle Wanek L. Aalyssa Elderkin, D.O. [facility] [address], [place] [address] Cell Phone (Mon-Fri 8am-5pm):  [phone] On Call:  [phone] & follow prompts after 5pm & weekends Office Phone:  [phone] Office Fax:  [phone]

## 2017-04-26 LAB — VITAMIN B12: VITAMIN B 12: 343

## 2017-08-08 ENCOUNTER — Non-Acute Institutional Stay: Payer: Medicare Other | Admitting: Internal Medicine

## 2017-08-08 ENCOUNTER — Encounter: Payer: Self-pay | Admitting: Internal Medicine

## 2017-08-08 VITALS — BP 128/60 | HR 70 | Temp 98.5°F | Wt 121.0 lb

## 2017-08-08 DIAGNOSIS — E538 Deficiency of other specified B group vitamins: Secondary | ICD-10-CM | POA: Diagnosis not present

## 2017-08-08 DIAGNOSIS — M069 Rheumatoid arthritis, unspecified: Secondary | ICD-10-CM

## 2017-08-08 DIAGNOSIS — I10 Essential (primary) hypertension: Secondary | ICD-10-CM | POA: Diagnosis not present

## 2017-08-08 DIAGNOSIS — F0150 Vascular dementia without behavioral disturbance: Secondary | ICD-10-CM | POA: Insufficient documentation

## 2017-08-08 NOTE — Progress Notes (Signed)
Location:  Occupational psychologist of Service:  Clinic (12)  Provider: Takeysha Bonk L. Mariea Clonts, D.O., C.M.D.  Code Status: DNR, MOST Goals of Care:  Advanced Directives 08/08/2017  Does Patient Have a Medical Advance Directive? Yes  Type of Advance Directive Out of facility DNR (pink MOST or yellow form);Healthcare Power of Attorney  Does patient want to make changes to medical advance directive? No - Patient declined  Copy of [place] in Chart? Yes  Pre-existing out of facility DNR order (yellow form or pink MOST form) Yellow form placed in chart (order not valid for inpatient use);Pink MOST form placed in chart (order not valid for inpatient use)     Chief Complaint  Patient presents with  . Medical Management of Chronic Issues    40mth follow-up    HPI: Patient is a 83 y.o. female seen today for medical management of chronic diseases.  Her son is here with her.    Weight stable.  Identical to last visit in July.  Had been trending down before that.  On B12. Has not had repeat level done.    RA:  Denies any pain whatsoever.  Not on DMARDS--did not tolerate methotrexate.  Deformities of feet are severe.    Son here with her.  He has no concerns.  She's coming to their home for Thanksgiving.  Staff have not reported any changes with her.    She uses her manual wheelchair primarily.  Last visit, she had a tremendous amount of edema, but none today in feet.    Past Medical History:  Diagnosis Date  . Adenocarcinoma (epithelial) of ovary ([place])   . Adult failure to thrive   . Arthritis    rhumatoid, osteo,  . Breast cancer ([place])    LEFT  . Cancer (HCC)    ovarian,breast  . Cognitive decline   . Debility   . GERD (gastroesophageal reflux disease)   . Hyperlipidemia   . Hypertension   . Insomnia   . Mild aortic insufficiency   . Mitral regurgitation   . Osteopenia   . Pyelonephritis   . RA (rheumatoid arthritis) ([place])   .  Spondylolysis of cervical region   . Stroke [facility]) 1996   TIA 1996  . TIA (transient ischemic attack)   . UTI (lower urinary tract infection)   . Wears glasses     Past Surgical History:  Procedure Laterality Date  . ABDOMINAL HYSTERECTOMY  1989   tah/bso-cancer-radiation  . BREAST LUMPECTOMY     left  . EYE SURGERY Bilateral    cataracts  . KNEE ARTHROSCOPY Left   . MOHS SURGERY  10/04/2015   squamous cell carcinoma left mandibular angle  Dr. Harvel Quale  . TOE AMPUTATION Bilateral 05/26/12   bilateral second toe amputations    Allergies  Allergen Reactions  . Methotrexate Derivatives     Outpatient Encounter Medications as of 08/08/2017  Medication Sig  . alendronate (FOSAMAX) 70 MG tablet Take 70 mg by mouth once a week. Take with a full glass of water on an empty stomach.  Marland Kitchen aspirin EC 81 MG EC tablet Take 1 tablet (81 mg total) by mouth daily.  . carbamide peroxide (DEBROX) 6.5 % OTIC solution Place 5 drops into both ears 2 (two) times daily.  . cholecalciferol (VITAMIN D) 1000 UNITS tablet Take 1,000 Units by mouth. Take 2 tablets in morning and one tablet in evening  . COD LIVER OIL PO Take 1 tablet by  mouth daily.   . feeding supplement (BOOST HIGH PROTEIN) LIQD Take 1 Container by mouth 2 (two) times daily between meals.  . gabapentin (NEURONTIN) 100 MG capsule Take 100 mg by mouth at bedtime.  . metoprolol succinate (TOPROL-XL) 50 MG 24 hr tablet Take 50 mg by mouth daily. Take with or immediately following a meal.  . Multiple Vitamins-Minerals (MULTIVITAMIN ADULT PO) Take by mouth. Take one daily  . NAMZARIC 28-10 MG CP24 Take one tablet daily for memory  . senna-docusate (SENOKOT-S) 8.6-50 MG per tablet Take 1 tablet by mouth at bedtime.  . simvastatin (ZOCOR) 10 MG tablet Take 10 mg by mouth daily at 6 PM.   . verapamil (VERELAN PM) 180 MG 24 hr capsule Take 180 mg by mouth daily.  . vitamin B-12 (CYANOCOBALAMIN) 1000 MCG tablet Take 1,000 mcg daily by mouth.   No  facility-administered encounter medications on file as of 08/08/2017.     Review of Systems:  Review of Systems  Constitutional: Negative for chills, fever, malaise/fatigue and weight loss.  HENT: Positive for hearing loss. Negative for congestion.   Eyes: Negative for blurred vision.       Glasses  Respiratory: Negative for cough and shortness of breath.   Cardiovascular: Negative for chest pain, palpitations and leg swelling.  Gastrointestinal: Positive for constipation. Negative for abdominal pain, blood in stool and melena.  Genitourinary: Negative for dysuria.  Musculoskeletal: Negative for falls.  Neurological: Positive for weakness. Negative for dizziness and loss of consciousness.  Psychiatric/Behavioral: Positive for memory loss. Negative for depression. The patient is not nervous/anxious and does not have insomnia.     Health Maintenance  Topic Date Due  . INFLUENZA VACCINE  04/25/2017  . TETANUS/TDAP  10/15/2019  . DEXA SCAN  Completed  . PNA vac Low Risk Adult  Completed    Physical Exam: Vitals:   08/08/17 1044  BP: 128/60  Pulse: 70  Temp: 98.5 F (36.9 C)  TempSrc: Oral  SpO2: 97%  Weight: 121 lb (54.9 kg)   Body mass index is 21.43 kg/m. Physical Exam  Constitutional: No distress.  Frail white female seated in wheelchair, here with her son  HENT:  Head: Normocephalic and atraumatic.  Eyes:  glasses  Cardiovascular: Normal rate, regular rhythm and intact distal pulses.  Murmur heard. Pulmonary/Chest: Effort normal and breath sounds normal. No respiratory distress. She has no rales.  Abdominal: Soft. Bowel sounds are normal. She exhibits no distension. There is no tenderness.  Musculoskeletal: Normal range of motion. She exhibits deformity.  Severe deformities of toes with hammertoes and bunions, arthritic changes  Neurological: She is alert. A sensory deficit is present. No cranial nerve deficit.  Oriented to person and place  Skin: Skin is warm  and dry.  Purple discoloration and edema improved vs last visit in feet  Psychiatric: She has a normal mood and affect.    Labs reviewed: Basic Metabolic Panel: Recent Labs    12/22/16 0600 12/23/16 0600 12/25/16 0609  NA 129* 134* 136*  K 3.9 3.1* 3.7  BUN 20 14 14   CREATININE 0.6 0.6 0.6   Liver Function Tests: No results for input(s): AST, ALT, ALKPHOS, BILITOT, PROT, ALBUMIN in the last 8760 hours. No results for input(s): LIPASE, AMYLASE in the last 8760 hours. No results for input(s): AMMONIA in the last 8760 hours. CBC: Recent Labs    08/29/16 0300  WBC 6.4  HGB 13.9  HCT 43  PLT 201   Lipid Panel: No results for  input(s): CHOL, HDL, LDLCALC, TRIG, CHOLHDL, LDLDIRECT in the last 8760 hours. Lab Results  Component Value Date   HGBA1C 5.7 (H) 07/01/2012   Assessment/Plan .1. B12 deficiency -f/u lab for level, cont supplement  2. Rheumatoid arthritis involving multiple sites, unspecified rheumatoid factor presence ([place]) -ongoing, not painful recently, she's not physically active, does go to activities, but wheelchair bound -has gabapentin for associated neuropathy  3. Vascular dementia without behavioral disturbance -continues on namzaric, lives in [place], staff have not reported concerns about need for higher level of care  4. Essential hypertension -bp well controlled with verapamil, metoprolol  Labs/tests ordered:  Cbc, bmp, B12 level next draw Next appt:  Visit date not found  Jaimin Krupka L. Kynzlie Hilleary, D.O. [facility] [address], [place] [address] Cell Phone (Mon-Fri 8am-5pm):  [phone] On Call:  [phone] & follow prompts after 5pm & weekends Office Phone:  [phone] Office Fax:  [phone]

## 2017-08-10 LAB — BASIC METABOLIC PANEL
BUN: 17 (ref 4–21)
Creatinine: 0.6 (ref 0.5–1.1)
Glucose: 89
Potassium: 3.9 (ref 3.4–5.3)
Sodium: 139 (ref 137–147)

## 2017-08-10 LAB — CBC AND DIFFERENTIAL
HCT: 40 (ref 36–46)
Hemoglobin: 13.5 (ref 12.0–16.0)
Platelets: 184 (ref 150–399)
WBC: 5.6

## 2017-08-10 LAB — VITAMIN B12: Vitamin B-12: 1393

## 2017-08-14 ENCOUNTER — Encounter: Payer: Self-pay | Admitting: Internal Medicine

## 2017-11-22 ENCOUNTER — Telehealth: Payer: Self-pay | Admitting: *Deleted

## 2017-11-22 NOTE — Telephone Encounter (Signed)
Son calling asking that a copy of the referral for hearing evaluation be [facility] to [facility]  [phone]. I asked [facility] to fax me a copy of the referral to send to [facility]. Referral sent  (will be copied and scanned also)

## 2017-12-12 ENCOUNTER — Encounter: Payer: Medicare Other | Admitting: Internal Medicine

## 2017-12-19 ENCOUNTER — Encounter: Payer: Self-pay | Admitting: Internal Medicine

## 2017-12-19 ENCOUNTER — Non-Acute Institutional Stay: Payer: Medicare Other | Admitting: Internal Medicine

## 2017-12-19 VITALS — BP 120/70 | HR 60 | Temp 98.0°F | Ht 63.0 in | Wt 121.0 lb

## 2017-12-19 DIAGNOSIS — R230 Cyanosis: Secondary | ICD-10-CM | POA: Diagnosis not present

## 2017-12-19 DIAGNOSIS — I10 Essential (primary) hypertension: Secondary | ICD-10-CM

## 2017-12-19 DIAGNOSIS — R531 Weakness: Secondary | ICD-10-CM | POA: Diagnosis not present

## 2017-12-19 DIAGNOSIS — F0150 Vascular dementia without behavioral disturbance: Secondary | ICD-10-CM | POA: Diagnosis not present

## 2017-12-19 DIAGNOSIS — M069 Rheumatoid arthritis, unspecified: Secondary | ICD-10-CM | POA: Diagnosis not present

## 2017-12-19 NOTE — Progress Notes (Signed)
Location:  Occupational psychologist of Service:  Clinic (12)  Provider: Maralee Higuchi L. Mariea Clonts, D.O., C.M.D.  Code Status: DNR Goals of Care:  Advanced Directives 12/19/2017  Does Patient Have a Medical Advance Directive? Yes  Type of Paramedic of [place];Living will;Out of facility DNR (pink MOST or yellow form)  Does patient want to make changes to medical advance directive? No - Patient declined  Copy of [place] in Chart? Yes  Pre-existing out of facility DNR order (yellow form or pink MOST form) Yellow form placed in chart (order not valid for inpatient use);Pink MOST form placed in chart (order not valid for inpatient use)   Chief Complaint  Patient presents with  . Medical Management of Chronic Issues    69mth follow-up    HPI: Patient is a 84 y.o. female seen today for medical management of chronic diseases.    She reports doing fine.  She denies pain.   She continues to have poor circulation in her feet, left with bluish toes.  She is not a candidate for a vascular surgery due to her dementia and frailty.   Her weight is stable.  Says her appetite is fine.  She sleeps well.  Staff did not report any concerns about her whatsoever.  [place] and gets some assistance with her bathing and dressing, but is able to feed herself indpendently.  She uses her manual wheelchair primarily for mobility.   She has a MOST form on file that I completed with her son Broadus John in July.  It is scanned in the Document List.    Past Medical History:  Diagnosis Date  . Adenocarcinoma (epithelial) of ovary ([place])   . Adult failure to thrive   . Arthritis    rhumatoid, osteo,  . Breast cancer ([place])    LEFT  . Cancer (HCC)    ovarian,breast  . Cognitive decline   . Debility   . GERD (gastroesophageal reflux disease)   . Hyperlipidemia   . Hypertension   . Insomnia   . Mild aortic insufficiency   . Mitral regurgitation    . Osteopenia   . Pyelonephritis   . RA (rheumatoid arthritis) ([place])   . Spondylolysis of cervical region   . Stroke [facility]) 1996   TIA 1996  . TIA (transient ischemic attack)   . UTI (lower urinary tract infection)   . Wears glasses     Past Surgical History:  Procedure Laterality Date  . ABDOMINAL HYSTERECTOMY  1989   tah/bso-cancer-radiation  . AMPUTATION Right 05/14/2014   Procedure: RIGHT THIRD TOE AMPUTATION ;  Surgeon: Wylene Simmer, MD;  Location: [facility];  Service: Orthopedics;  Laterality: Right;  . BREAST LUMPECTOMY     left  . EYE SURGERY Bilateral    cataracts  . KNEE ARTHROSCOPY Left   . MOHS SURGERY  10/04/2015   squamous cell carcinoma left mandibular angle  Dr. Harvel Quale  . TOE AMPUTATION Bilateral 05/26/12   bilateral second toe amputations    Allergies  Allergen Reactions  . Methotrexate Derivatives     Outpatient Encounter Medications as of 12/19/2017  Medication Sig  . alendronate (FOSAMAX) 70 MG tablet Take 70 mg by mouth once a week. Take with a full glass of water on an empty stomach.  Marland Kitchen aspirin EC 81 MG EC tablet Take 1 tablet (81 mg total) by mouth daily.  . carbamide peroxide (DEBROX) 6.5 % OTIC  solution Place 5 drops into both ears 2 (two) times daily.  . cholecalciferol (VITAMIN D) 1000 UNITS tablet Take 1,000 Units by mouth. Take 2 tablets in morning and one tablet in evening  . COD LIVER OIL PO Take 1 tablet by mouth daily.   . feeding supplement (BOOST HIGH PROTEIN) LIQD Take 1 Container by mouth 2 (two) times daily between meals.  . gabapentin (NEURONTIN) 100 MG capsule Take 100 mg by mouth at bedtime.  . metoprolol succinate (TOPROL-XL) 50 MG 24 hr tablet Take 50 mg by mouth daily. Take with or immediately following a meal.  . Multiple Vitamins-Minerals (MULTIVITAMIN ADULT PO) Take by mouth. Take one daily  . NAMZARIC 28-10 MG CP24 Take one tablet daily for memory  . senna-docusate (SENOKOT-S) 8.6-50 MG per tablet Take 1 tablet by  mouth at bedtime.  . simvastatin (ZOCOR) 10 MG tablet Take 10 mg by mouth daily at 6 PM.   . verapamil (VERELAN PM) 180 MG 24 hr capsule Take 180 mg by mouth daily.  . vitamin B-12 (CYANOCOBALAMIN) 1000 MCG tablet Take 1,000 mcg daily by mouth.   No facility-administered encounter medications on file as of 12/19/2017.     Review of Systems:  Review of Systems  Constitutional: Positive for malaise/fatigue. Negative for chills, diaphoresis, fever and weight loss.  HENT: Positive for hearing loss.   Eyes: Negative for blurred vision.       Glasses  Respiratory: Negative for cough and shortness of breath.   Cardiovascular: Negative for chest pain and palpitations.  Gastrointestinal: Negative for abdominal pain, blood in stool, constipation and melena.  Genitourinary: Negative for dysuria.  Musculoskeletal: Negative for falls and joint pain.       Deformity of feet  Skin: Negative for itching and rash.  Neurological: Negative for dizziness and loss of consciousness.  Endo/Heme/Allergies: Bruises/bleeds easily.  Psychiatric/Behavioral: Positive for memory loss. Negative for depression. The patient is not nervous/anxious and does not have insomnia.     Health Maintenance  Topic Date Due  . TETANUS/TDAP  10/15/2019  . INFLUENZA VACCINE  Completed  . DEXA SCAN  Completed  . PNA vac Low Risk Adult  Completed    Physical Exam: Vitals:   12/19/17 0936  BP: 120/70  Pulse: 60  Temp: 98 F (36.7 C)  TempSrc: Oral  SpO2: 96%  Weight: 121 lb (54.9 kg)  Height: 5\' 3"  (1.6 m)   Body mass index is 21.43 kg/m. Physical Exam  Constitutional: No distress.  Frail female resting in wheelchair  HENT:  Head: Normocephalic and atraumatic.  Hearing aids  Eyes:  glasses  Cardiovascular: Normal rate and regular rhythm.  Murmur heard. Pulmonary/Chest: Effort normal and breath sounds normal. No respiratory distress. She has no rales.  Abdominal: Soft. Bowel sounds are normal.    Musculoskeletal: Normal range of motion.  Neurological: She is alert.  Skin:  Seborrheic keratoses of face and dry scaly skin on nose; left foot bluer and cooler than right; both with extensive varicosities; fungal toenails, deformity of toes  Psychiatric: She has a normal mood and affect.  Very pleasant, repeatedly says thank you    Labs reviewed: Basic Metabolic Panel: Recent Labs    12/23/16 0600 12/25/16 0609 08/10/17 0700  NA 134* 136* 139  K 3.1* 3.7 3.9  BUN 14 14 17   CREATININE 0.6 0.6 0.6   Liver Function Tests: No results for input(s): AST, ALT, ALKPHOS, BILITOT, PROT, ALBUMIN in the last 8760 hours. No results for input(s): LIPASE,  AMYLASE in the last 8760 hours. No results for input(s): AMMONIA in the last 8760 hours. CBC: Recent Labs    08/10/17 0700  WBC 5.6  HGB 13.5  HCT 40  PLT 184   Lipid Panel: No results for input(s): CHOL, HDL, LDLCALC, TRIG, CHOLHDL, LDLDIRECT in the last 8760 hours. Lab Results  Component Value Date   HGBA1C 5.7 (H) 07/01/2012    Assessment/Plan 1. Vascular dementia without behavioral disturbance -progressing gradually, likely some mix with AD, very pleasant, no behaviors  2. Rheumatoid arthritis involving multiple sites, unspecified rheumatoid factor presence ([place]) -denies any pain whatsoever, appears comfortable, cont current regimen  3. Peripheral cyanosis -not a candidate for vascular surgery--comfort goals and no pain  4. Weakness generalized -cont wheelchair use due to weakness and assistance with adls  5. Essential hypertension -bp is well controlled on current regimen, cont same and monitor    Labs/tests ordered:  No new Next appt:  4 mos med mgt  Izear Pine L. Renan Danese, D.O. [facility] [address], [place] [address] Cell Phone (Mon-Fri 8am-5pm):  [phone] On Call:  [phone] & follow prompts after 5pm & weekends Office Phone:   [phone] Office Fax:  [phone]

## 2018-04-03 ENCOUNTER — Non-Acute Institutional Stay: Payer: Medicare Other

## 2018-04-03 DIAGNOSIS — Z0000 Encounter for general adult medical examination without abnormal findings: Secondary | ICD-10-CM | POA: Diagnosis not present

## 2018-04-03 NOTE — Progress Notes (Signed)
Subjective:   Alexa Rowe is a 84 y.o. female who presents for Medicare Annual (Subsequent) preventive examination at [place] [facility]-03/21/2017    Objective:     Vitals: BP 118/64 (BP Location: Right Arm, Patient Position: Sitting)   Pulse 68   Temp (!) 97.5 F (36.4 C) (Oral)   Ht 5\' 3"  (1.6 m)   Wt 121 lb (54.9 kg)   SpO2 97%   BMI 21.43 kg/m   Body mass index is 21.43 kg/m.  Advanced Directives 12/19/2017 08/08/2017 04/04/2017 03/21/2017 11/29/2016 08/30/2016 03/01/2016  Does Patient Have a Medical Advance Directive? Yes Yes Yes Yes Yes Yes Yes  Type of Paramedic of [place];Living will;Out of facility DNR (pink MOST or yellow form) Out of facility DNR (pink MOST or yellow form);Healthcare Power of [employer] of facility DNR (pink MOST or yellow form);Healthcare Power of [place];Living will;Out of facility DNR (pink MOST or yellow form) Out of facility DNR (pink MOST or yellow form);Healthcare Power of [employer] of facility DNR (pink MOST or yellow form);Healthcare Power of [place];Out of facility DNR (pink MOST or yellow form)  Does patient want to make changes to medical advance directive? No - Patient declined No - Patient declined - No - Patient declined - - -  Copy of [place] in Chart? Yes Yes Yes Yes Yes Yes Yes  Pre-existing out of facility DNR order (yellow form or pink MOST form) Yellow form placed in chart (order not valid for inpatient use);Pink MOST form placed in chart (order not valid for inpatient use) Yellow form placed in chart (order not valid for inpatient use);Pink MOST form placed in chart (order not valid for inpatient use) Yellow form placed in chart (order not valid for inpatient use) Yellow form placed in chart (order not valid for inpatient use);Pink MOST form placed in chart (order not valid for inpatient use) Yellow form placed in  chart (order not valid for inpatient use) Yellow form placed in chart (order not valid for inpatient use) Yellow form placed in chart (order not valid for inpatient use)    Tobacco Social History   Tobacco Use  Smoking Status Never Smoker  Smokeless Tobacco Never Used     Counseling given: Not Answered   Clinical Intake:  Pre-visit preparation completed: No  Pain : No/denies pain     Nutritional Risks: None Diabetes: No  How often do you need to have someone help you when you read instructions, pamphlets, or other written materials from your doctor or pharmacy?: 1 - Never What is the last grade level you completed in school?: College  Interpreter Needed?: No  Information entered by :: Tyson Dense, RN  Past Medical History:  Diagnosis Date  . Adenocarcinoma (epithelial) of ovary ([place])   . Adult failure to thrive   . Arthritis    rhumatoid, osteo,  . Breast cancer ([place])    LEFT  . Cancer (HCC)    ovarian,breast  . Cognitive decline   . Debility   . GERD (gastroesophageal reflux disease)   . Hyperlipidemia   . Hypertension   . Insomnia   . Mild aortic insufficiency   . Mitral regurgitation   . Osteopenia   . Pyelonephritis   . RA (rheumatoid arthritis) ([place])   . Spondylolysis of cervical region   . Stroke [facility]) 1996   TIA 1996  . TIA (transient ischemic attack)   .  UTI (lower urinary tract infection)   . Wears glasses    Past Surgical History:  Procedure Laterality Date  . ABDOMINAL HYSTERECTOMY  1989   tah/bso-cancer-radiation  . AMPUTATION Right 05/14/2014   Procedure: RIGHT THIRD TOE AMPUTATION ;  Surgeon: Wylene Simmer, MD;  Location: [facility];  Service: Orthopedics;  Laterality: Right;  . BREAST LUMPECTOMY     left  . EYE SURGERY Bilateral    cataracts  . KNEE ARTHROSCOPY Left   . MOHS SURGERY  10/04/2015   squamous cell carcinoma left mandibular angle  Dr. Harvel Quale  . TOE AMPUTATION Bilateral 05/26/12   bilateral second toe  amputations   Family History  Problem Relation Age of Onset  . Heart disease Father    Social History   Socioeconomic History  . Marital status: Married    Spouse name: Not on file  . Number of children: Not on file  . Years of education: Not on file  . Highest education level: Not on file  Occupational History  . Occupation: retired Product manager  Social Needs  . Financial resource strain: Not hard at all  . Food insecurity:    Worry: Never true    Inability: Never true  . Transportation needs:    Medical: No    Non-medical: No  Tobacco Use  . Smoking status: Never Smoker  . Smokeless tobacco: Never Used  Substance and Sexual Activity  . Alcohol use: No  . Drug use: No  . Sexual activity: Never  Lifestyle  . Physical activity:    Days per week: 0 days    Minutes per session: 0 min  . Stress: Not at all  Relationships  . Social connections:    Talks on phone: More than three times a week    Gets together: More than three times a week    Attends religious service: Never    Active member of club or organization: No    Attends meetings of clubs or organizations: Never    Relationship status: Married  Other Topics Concern  . 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 Marland Kitchen Attends [place] [employer] meeting. Therapy three times weekly        Patient has Advanced planning document: DNR, POA, HCPOA             Outpatient Encounter Medications as of 04/03/2018  Medication Sig  . alendronate (FOSAMAX) 70 MG tablet Take 70 mg by mouth once a week. Take with a full glass of water on an empty stomach.  Marland Kitchen aspirin EC 81 MG EC tablet Take 1 tablet (81 mg total) by mouth  daily.  . carbamide peroxide (DEBROX) 6.5 % OTIC solution Place 5 drops into both ears 2 (two) times daily.  . cholecalciferol (VITAMIN D) 1000 UNITS tablet Take 1,000 Units by mouth. Take 2 tablets in morning and one tablet in evening  . COD LIVER OIL PO Take 1 tablet by mouth daily.   . feeding supplement (BOOST HIGH PROTEIN) LIQD Take 1 Container by mouth 2 (two) times daily between meals.  . gabapentin (NEURONTIN) 100 MG capsule Take 100 mg by mouth at bedtime.  . metoprolol  succinate (TOPROL-XL) 50 MG 24 hr tablet Take 50 mg by mouth daily. Take with or immediately following a meal.  . Multiple Vitamins-Minerals (MULTIVITAMIN ADULT PO) Take by mouth. Take one daily  . NAMZARIC 28-10 MG CP24 Take one tablet daily for memory  . senna-docusate (SENOKOT-S) 8.6-50 MG per tablet Take 1 tablet by mouth at bedtime.  . simvastatin (ZOCOR) 10 MG tablet Take 10 mg by mouth daily at 6 PM.   . verapamil (VERELAN PM) 180 MG 24 hr capsule Take 180 mg by mouth daily.  . vitamin B-12 (CYANOCOBALAMIN) 1000 MCG tablet Take 1,000 mcg daily by mouth.   No facility-administered encounter medications on file as of 04/03/2018.     Activities of Daily Living In your present state of health, do you have any difficulty performing the following activities: 04/03/2018  Hearing? Y  Vision? N  Difficulty concentrating or making decisions? Y  Walking or climbing stairs? Y  Dressing or bathing? Y  Doing errands, shopping? Y  Preparing Food and eating ? Y  Using the Toilet? Y  In the past six months, have you accidently leaked urine? N  Do you have problems with loss of bowel control? N  Managing your Medications? Y  Managing your Finances? Y  Housekeeping or managing your Housekeeping? Y  Some recent data might be hidden    Patient Care Team: Gayland Curry, DO as PCP - General (Geriatric Medicine) Wylene Simmer, MD as Consulting Physician (Orthopedic Surgery) [facility],  Carlos American, NP as Nurse Practitioner (Nurse Practitioner)    Assessment:   This is a routine wellness examination for [facility].  Exercise Activities and Dietary recommendations Current Exercise Habits: The patient does not participate in regular exercise at present, Exercise limited by: orthopedic condition(s)  Goals    None      Fall Risk Fall Risk  04/03/2018 08/08/2017 03/21/2017 08/30/2016 10/27/2015  Falls in the past year? No No No No No  Risk for fall due to : - - - - -  Risk for fall due to: Comment - - - - -   Is the patient's home free of loose throw rugs in walkways, pet beds, electrical cords, etc?   yes      Grab bars in the bathroom? yes      Handrails on the stairs?   yes      Adequate lighting?   yes   Depression Screen PHQ 2/9 Scores 04/03/2018 08/08/2017 03/21/2017 08/30/2016  PHQ - 2 Score 0 0 0 0     Cognitive Function completed within last year MMSE - Mini Mental State Exam 02/15/2017 10/27/2015  Orientation to time 1 2  Orientation to Place 5 5  Registration 3 3  Attention/ Calculation 5 5  Recall 3 3  Language- name 2 objects 2 2  Language- repeat 1 1  Language- follow 3 step command 3 3  Language- read & follow direction 1 1  Write a sentence 1 1  Copy design 1 1  Total score 26 27        Immunization History  Administered Date(s) Administered  . Influenza Inj Mdck Quad Pf 07/13/2016  . Influenza-Unspecified 06/20/2010, 05/27/2012, 07/08/2015, 07/19/2017  . Pneumococcal-Unspecified 10/14/2009  . Td 10/14/2009  . Zoster 10/14/2009  . Zoster Recombinat (Shingrix) 10/19/2017    Qualifies for Shingles Vaccine? Waiting for second shot  Screening Tests Health Maintenance  Topic Date Due  . INFLUENZA VACCINE  04/25/2018  . TETANUS/TDAP  10/15/2019  .  DEXA SCAN  Completed  . PNA vac Low Risk Adult  Completed    Cancer Screenings: Lung: Low Dose CT Chest recommended if Age 64-80 years, 30 pack-year currently smoking OR have quit w/in 15years.  Patient does not qualify. Breast:  Up to date on Mammogram? Yes   Up to date of Bone Density/Dexa? Yes Colorectal: up to date  Additional Screenings:  Hepatitis C Screening: not able to accept or decline     Plan:    I have personally reviewed and addressed the Medicare Annual Wellness questionnaire and have noted the following in the patient's chart:  A. Medical and social history B. Use of alcohol, tobacco or illicit drugs  C. Current medications and supplements D. Functional ability and status E.  Nutritional status F.  Physical activity G. Advance directives H. List of other physicians I.  Hospitalizations, surgeries, and ER visits in previous 12 months J.  [place] to include hearing, vision, cognitive, depression L. Referrals and appointments - none  In addition, I have reviewed and discussed with patient certain preventive protocols, quality metrics, and best practice recommendations. A written personalized care plan for preventive services as well as general preventive health recommendations were provided to patient.  See attached scanned questionnaire for additional information.   Signed,   Tyson Dense, RN Nurse Health Advisor  Patent Concerns: None

## 2018-04-03 NOTE — Patient Instructions (Addendum)
Alexa Rowe , Thank you for taking time to come for your Medicare Wellness Visit. I appreciate your ongoing commitment to your health goals. Please review the following plan we discussed and let me know if I can assist you in the future.   Screening recommendations/referrals: Colonoscopy excluded, over age 82 Mammogram excluded, over age 59 Bone Density up to date Recommended yearly ophthalmology/optometry visit for glaucoma screening and checkup Recommended yearly dental visit for hygiene and checkup  Vaccinations: Influenza vaccine up to date, due 2019 fall season Pneumococcal vaccine up to date, completed Tdap vaccine up to date, due 10/15/2019 Shingles vaccine waiting for second shot    Advanced directives: in chart  Conditions/risks identified: none  Next appointment: Dr. Mariea Clonts 04/17/2018 @ 9:30am   Preventive Care 34 Years and Older, Female Preventive care refers to lifestyle choices and visits with your health care provider that can promote health and wellness. What does preventive care include?  A yearly physical exam. This is also called an annual well check.  Dental exams once or twice a year.  Routine eye exams. Ask your health care provider how often you should have your eyes checked.  Personal lifestyle choices, including:  Daily care of your teeth and gums.  Regular physical activity.  Eating a healthy diet.  Avoiding tobacco and drug use.  Limiting alcohol use.  Practicing safe sex.  Taking low-dose aspirin every day.  Taking vitamin and mineral supplements as recommended by your health care provider. What happens during an annual well check? The services and screenings done by your health care provider during your annual well check will depend on your age, overall health, lifestyle risk factors, and family history of disease. Counseling  Your health care provider may ask you questions about your:  Alcohol use.  Tobacco use.  Drug  use.  Emotional well-being.  Home and relationship well-being.  Sexual activity.  Eating habits.  History of falls.  Memory and ability to understand (cognition).  Work and work Statistician.  Reproductive health. Screening  You may have the following tests or measurements:  Height, weight, and BMI.  Blood pressure.  Lipid and cholesterol levels. These may be checked every 5 years, or more frequently if you are over 1 years old.  Skin check.  Lung cancer screening. You may have this screening every year starting at age 85 if you have a 30-pack-year history of smoking and currently smoke or have quit within the past 15 years.  Fecal occult blood test (FOBT) of the stool. You may have this test every year starting at age 53.  Flexible sigmoidoscopy or colonoscopy. You may have a sigmoidoscopy every 5 years or a colonoscopy every 10 years starting at age 34.  Hepatitis C blood test.  Hepatitis B blood test.  Sexually transmitted disease (STD) testing.  Diabetes screening. This is done by checking your blood sugar (glucose) after you have not eaten for a while (fasting). You may have this done every 1-3 years.  Bone density scan. This is done to screen for osteoporosis. You may have this done starting at age 101.  Mammogram. This may be done every 1-2 years. Talk to your health care provider about how often you should have regular mammograms. Talk with your health care provider about your test results, treatment options, and if necessary, the need for more tests. Vaccines  Your health care provider may recommend certain vaccines, such as:  Influenza vaccine. This is recommended every year.  Tetanus, diphtheria, and acellular pertussis (  Tdap, Td) vaccine. You may need a Td booster every 10 years.  Zoster vaccine. You may need this after age 82.  Pneumococcal 13-valent conjugate (PCV13) vaccine. One dose is recommended after age 33.  Pneumococcal polysaccharide  (PPSV23) vaccine. One dose is recommended after age 34. Talk to your health care provider about which screenings and vaccines you need and how often you need them. This information is not intended to replace advice given to you by your health care provider. Make sure you discuss any questions you have with your health care provider. Document Released: 10/08/2015 Document Revised: 05/31/2016 Document Reviewed: 07/13/2015 Elsevier Interactive Patient Education  2017 [place] Prevention in the Home Falls can cause injuries. They can happen to people of all ages. There are many things you can do to make your home safe and to help prevent falls. What can I do on the outside of my home?  Regularly fix the edges of walkways and driveways and fix any cracks.  Remove anything that might make you trip as you walk through a door, such as a raised step or threshold.  Trim any bushes or trees on the path to your home.  Use bright outdoor lighting.  Clear any walking paths of anything that might make someone trip, such as rocks or tools.  Regularly check to see if handrails are loose or broken. Make sure that both sides of any steps have handrails.  Any raised decks and porches should have guardrails on the edges.  Have any leaves, snow, or ice cleared regularly.  Use sand or salt on walking paths during winter.  Clean up any spills in your garage right away. This includes oil or grease spills. What can I do in the bathroom?  Use night lights.  Install grab bars by the toilet and in the tub and shower. Do not use towel bars as grab bars.  Use non-skid mats or decals in the tub or shower.  If you need to sit down in the shower, use a plastic, non-slip stool.  Keep the floor dry. Clean up any water that spills on the floor as soon as it happens.  Remove soap buildup in the tub or shower regularly.  Attach bath mats securely with double-sided non-slip rug tape.  Do not have  throw rugs and other things on the floor that can make you trip. What can I do in the bedroom?  Use night lights.  Make sure that you have a light by your bed that is easy to reach.  Do not use any sheets or blankets that are too big for your bed. They should not hang down onto the floor.  Have a firm chair that has side arms. You can use this for support while you get dressed.  Do not have throw rugs and other things on the floor that can make you trip. What can I do in the kitchen?  Clean up any spills right away.  Avoid walking on wet floors.  Keep items that you use a lot in easy-to-reach places.  If you need to reach something above you, use a strong step stool that has a grab bar.  Keep electrical cords out of the way.  Do not use floor polish or wax that makes floors slippery. If you must use wax, use non-skid floor wax.  Do not have throw rugs and other things on the floor that can make you trip. What can I do with my stairs?  Do  not leave any items on the stairs.  Make sure that there are handrails on both sides of the stairs and use them. Fix handrails that are broken or loose. Make sure that handrails are as long as the stairways.  Check any carpeting to make sure that it is firmly attached to the stairs. Fix any carpet that is loose or worn.  Avoid having throw rugs at the top or bottom of the stairs. If you do have throw rugs, attach them to the floor with carpet tape.  Make sure that you have a light switch at the top of the stairs and the bottom of the stairs. If you do not have them, ask someone to add them for you. What else can I do to help prevent falls?  Wear shoes that:  Do not have high heels.  Have rubber bottoms.  Are comfortable and fit you well.  Are closed at the toe. Do not wear sandals.  If you use a stepladder:  Make sure that it is fully opened. Do not climb a closed stepladder.  Make sure that both sides of the stepladder are  locked into place.  Ask someone to hold it for you, if possible.  Clearly mark and make sure that you can see:  Any grab bars or handrails.  First and last steps.  Where the edge of each step is.  Use tools that help you move around (mobility aids) if they are needed. These include:  Canes.  Walkers.  Scooters.  Crutches.  Turn on the lights when you go into a dark area. Replace any light bulbs as soon as they burn out.  Set up your furniture so you have a clear path. Avoid moving your furniture around.  If any of your floors are uneven, fix them.  If there are any pets around you, be aware of where they are.  Review your medicines with your doctor. Some medicines can make you feel dizzy. This can increase your chance of falling. Ask your doctor what other things that you can do to help prevent falls. This information is not intended to replace advice given to you by your health care provider. Make sure you discuss any questions you have with your health care provider. Document Released: 07/08/2009 Document Revised: 02/17/2016 Document Reviewed: 10/16/2014 Elsevier Interactive Patient Education  2017 [employer].

## 2018-04-17 ENCOUNTER — Encounter: Payer: Self-pay | Admitting: Internal Medicine

## 2018-04-17 ENCOUNTER — Non-Acute Institutional Stay: Payer: Medicare Other | Admitting: Internal Medicine

## 2018-04-17 VITALS — BP 130/70 | HR 68 | Temp 98.0°F | Ht 63.0 in | Wt 123.0 lb

## 2018-04-17 DIAGNOSIS — I080 Rheumatic disorders of both mitral and aortic valves: Secondary | ICD-10-CM

## 2018-04-17 DIAGNOSIS — I10 Essential (primary) hypertension: Secondary | ICD-10-CM | POA: Diagnosis not present

## 2018-04-17 DIAGNOSIS — R531 Weakness: Secondary | ICD-10-CM

## 2018-04-17 DIAGNOSIS — F0150 Vascular dementia without behavioral disturbance: Secondary | ICD-10-CM

## 2018-04-17 DIAGNOSIS — M069 Rheumatoid arthritis, unspecified: Secondary | ICD-10-CM | POA: Diagnosis not present

## 2018-04-17 NOTE — Progress Notes (Signed)
Location:   [facility]   Place of Service:   clinic  Provider: Maudell Stanbrough L. Mariea Clonts, D.O., C.M.D.  Code Status: DNR Goals of Care:  Advanced Directives 12/19/2017  Does Patient Have a Medical Advance Directive? Yes  Type of Paramedic of [place];Living will;Out of facility DNR (pink MOST or yellow form)  Does patient want to make changes to medical advance directive? No - Patient declined  Copy of [place] in Chart? Yes  Pre-existing out of facility DNR order (yellow form or pink MOST form) Yellow form placed in chart (order not valid for inpatient use);Pink MOST form placed in chart (order not valid for inpatient use)   Chief Complaint  Patient presents with  . Medical Management of Chronic Issues    108mth follow-up    HPI: Patient is a 84 y.o. female seen today for medical management of chronic diseases.    Staff did not provide any information of concern about resident.  She is doing well.  She denies pain, bowels are moving with senokot s, sleeps well. Has caregiver assistance with adls. Lives in [place].    She has osteoporosis on fosamax and vitamin D.  She had a fall in May.     She gets her ears flushed regularly due to cerumen impaction.   She remains on namzaric for her dementia.    Past Medical History:  Diagnosis Date  . 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 Adult failure to thrive   . Arthritis    rhumatoid, osteo,  . Breast cancer ([place])    LEFT  . Cancer (HCC)    ovarian,breast  . Cognitive decline   . Debility   . GERD (gastroesophageal reflux disease)   . Hyperlipidemia   . Hypertension   . Insomnia   . Mild aortic insufficiency   . Mitral regurgitation   . Osteopenia   . Pyelonephritis   . RA (rheumatoid arthritis) ([place])   . Spondylolysis of cervical region   . Stroke [facility]) 1996   TIA 1996  . TIA (transient ischemic attack)   . UTI (lower urinary tract infection)   . Wears glasses     Past Surgical  History:  Procedure Laterality Date  . ABDOMINAL HYSTERECTOMY  1989   tah/bso-cancer-radiation  . AMPUTATION Right 05/14/2014   Procedure: RIGHT THIRD TOE AMPUTATION ;  Surgeon: Wylene Simmer, MD;  Location: [facility];  Service: Orthopedics;  Laterality: Right;  . BREAST LUMPECTOMY     left  . EYE SURGERY Bilateral    cataracts  . KNEE ARTHROSCOPY Left   . MOHS SURGERY  10/04/2015   squamous cell carcinoma left mandibular angle  Dr. Harvel Quale  . TOE AMPUTATION Bilateral 05/26/12   bilateral second toe amputations    Allergies  Allergen Reactions  . Methotrexate Derivatives     Outpatient Encounter Medications as of 04/17/2018  Medication Sig  . alendronate (FOSAMAX) 70 MG tablet Take 70 mg by mouth once a week. Take with a full glass of water on an empty stomach.  Marland Kitchen aspirin EC 81 MG EC tablet Take 1 tablet (81 mg total) by mouth daily.  . carbamide peroxide (DEBROX) 6.5 % OTIC solution Place 5 drops into both ears 2 (two) times daily.  . cholecalciferol (VITAMIN D) 1000 UNITS tablet Take 1,000 Units by mouth. Take 2 tablets in morning and one tablet in evening  . COD LIVER OIL PO Take 1 tablet by mouth daily.   Marland Kitchen  feeding supplement (BOOST HIGH PROTEIN) LIQD Take 1 Container by mouth 2 (two) times daily between meals.  . gabapentin (NEURONTIN) 100 MG capsule Take 100 mg by mouth at bedtime.  . metoprolol succinate (TOPROL-XL) 50 MG 24 hr tablet Take 50 mg by mouth daily. Take with or immediately following a meal.  . Multiple Vitamins-Minerals (MULTIVITAMIN ADULT PO) Take by mouth. Take one daily  . NAMZARIC 28-10 MG CP24 Take one tablet daily for memory  . senna-docusate (SENOKOT-S) 8.6-50 MG per tablet Take 1 tablet by mouth at bedtime.  . simvastatin (ZOCOR) 10 MG tablet Take 10 mg by mouth daily at 6 PM.   . verapamil (VERELAN PM) 180 MG 24 hr capsule Take 180 mg by mouth daily.  . vitamin B-12 (CYANOCOBALAMIN) 1000 MCG tablet Take 1,000 mcg daily by mouth.   No  facility-administered encounter medications on file as of 04/17/2018.     Review of Systems:  Review of Systems  Constitutional: Negative for chills, fever and malaise/fatigue.  HENT: Positive for hearing loss. Negative for congestion.   Eyes:       Glasses  Respiratory: Negative for shortness of breath.   Cardiovascular: Negative for chest pain, palpitations and leg swelling.  Gastrointestinal: Negative for abdominal pain, blood in stool, constipation and melena.  Genitourinary: Negative for dysuria.  Musculoskeletal: Positive for joint pain. Negative for falls.       But denies pain  Neurological: Negative for dizziness and loss of consciousness.  Endo/Heme/Allergies: Bruises/bleeds easily.  Psychiatric/Behavioral: Positive for memory loss. Negative for depression. The patient is not nervous/anxious and does not have insomnia.     Health Maintenance  Topic Date Due  . INFLUENZA VACCINE  04/25/2018  . TETANUS/TDAP  10/15/2019  . DEXA SCAN  Completed  . PNA vac Low Risk Adult  Completed    Physical Exam: Vitals:   04/17/18 0924  BP: 130/70  Pulse: 68  Temp: 98 F (36.7 C)  TempSrc: Oral  SpO2: 95%  Weight: 123 lb (55.8 kg)  Height: 5\' 3"  (1.6 m)   Body mass index is 21.79 kg/m. Physical Exam  Constitutional: No distress.  Cardiovascular: Normal rate and regular rhythm.  Murmur heard. Pulmonary/Chest: Effort normal and breath sounds normal. No respiratory distress.  Abdominal: Bowel sounds are normal.  Musculoskeletal: Normal range of motion.  Deformity from RA  Neurological: She is alert.  Skin: Skin is warm and dry.  Psychiatric: She has a normal mood and affect.    Labs reviewed: Basic Metabolic Panel: Recent Labs    08/10/17 0700  NA 139  K 3.9  BUN 17  CREATININE 0.6   Liver Function Tests: No results for input(s): AST, ALT, ALKPHOS, BILITOT, PROT, ALBUMIN in the last 8760 hours. No results for input(s): LIPASE, AMYLASE in the last 8760 hours. No  results for input(s): AMMONIA in the last 8760 hours. CBC: Recent Labs    08/10/17 0700  WBC 5.6  HGB 13.5  HCT 40  PLT 184   Lipid Panel: No results for input(s): CHOL, HDL, LDLCALC, TRIG, CHOLHDL, LDLDIRECT in the last 8760 hours. Lab Results  Component Value Date   HGBA1C 5.7 (H) 07/01/2012   Assessment/Plan 1. Vascular dementia without behavioral disturbance -progressing gradually, remains pleasant, but memory poor -has some caregiver assistance and gets brought to appts in wheelchair  2. Rheumatoid arthritis involving multiple sites, unspecified rheumatoid factor presence (HCC) -not on DMARD at 84 yo with progressive dementia -goals are comfort-based, has MOST form and does not  c/o pain  3. Essential hypertension -bp at goal, no changes needed, cont same regimen  4. Weakness generalized -continues to use walker short distances and wheelchair for long distances  5. Mitral valve insufficiency and aortic valve insufficiency -asymptomatic, no sob, chest pain or syncope -monitor  Labs/tests ordered:  Cbc, cmp before Next appt:  4 mos med mgt  Venetia Prewitt L. Elishah Ashmore, D.O. [facility] [address], [place] [address] Cell Phone (Mon-Fri 8am-5pm):  [phone] On Call:  [phone] & follow prompts after 5pm & weekends Office Phone:  [phone] Office Fax:  [phone]

## 2018-06-28 ENCOUNTER — Encounter: Payer: Self-pay | Admitting: Plastic Surgery

## 2018-06-28 ENCOUNTER — Ambulatory Visit (INDEPENDENT_AMBULATORY_CARE_PROVIDER_SITE_OTHER): Payer: Medicare Other | Admitting: Plastic Surgery

## 2018-06-28 VITALS — BP 115/65 | HR 50 | Resp 16 | Ht 66.0 in | Wt 125.0 lb

## 2018-06-28 DIAGNOSIS — C44329 Squamous cell carcinoma of skin of other parts of face: Secondary | ICD-10-CM

## 2018-06-28 DIAGNOSIS — C44320 Squamous cell carcinoma of skin of unspecified parts of face: Secondary | ICD-10-CM | POA: Insufficient documentation

## 2018-06-28 DIAGNOSIS — C44529 Squamous cell carcinoma of skin of other part of trunk: Secondary | ICD-10-CM | POA: Diagnosis not present

## 2018-06-28 NOTE — Progress Notes (Signed)
Patient ID: Alexa Rowe, female    DOB: Jul 14, 1918, 84 y.o.   MRN: [id number]   Chief Complaint  Patient presents with  . 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  This was biopsied a few months ago by Dr. Harvel Quale and was positive.  At the time it was very inflamed.  It looks much better now.  It is 1.5 x 1.5 cm and abuts the brow of the left eyebrow.  There is also a 2 x 2 centimeter squamous cell carcinoma of the chest at the superior aspect.  This is slightly sore and tender for the patient.  There is no sign of infection.  No drainage.  The patient and the family are interested in having both areas excised.   Review of Systems  Constitutional: Negative.  Negative for activity change and appetite change.  HENT: Negative.   Eyes: Negative.   Respiratory: Negative.   Cardiovascular: Negative.   Endocrine: Negative.   Genitourinary: Negative.   Skin: Positive for wound.  Neurological: Negative.   Hematological: Negative.     Past Medical History:  Diagnosis Date  . 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 Adult failure to thrive   . Arthritis    rhumatoid, osteo,  . Breast cancer ([place])    LEFT  . Cancer (HCC)    ovarian,breast  . Cognitive decline   . Debility   . GERD (gastroesophageal reflux disease)   . Hyperlipidemia   . Hypertension   . Insomnia   . Mild aortic insufficiency   . Mitral regurgitation   . Osteopenia   . Pyelonephritis   . RA (rheumatoid arthritis) ([place])   . Spondylolysis of cervical region   . Stroke [facility]) 1996   TIA 1996  . TIA (transient ischemic attack)   . UTI (lower urinary tract infection)   . Wears glasses     Past Surgical History:  Procedure Laterality Date  . ABDOMINAL HYSTERECTOMY  1989   tah/bso-cancer-radiation  . AMPUTATION Right 05/14/2014   Procedure: RIGHT THIRD TOE AMPUTATION ;  Surgeon: Wylene Simmer, MD;  Location:  [facility];  Service: Orthopedics;  Laterality: Right;  . BREAST LUMPECTOMY     left  . EYE SURGERY Bilateral    cataracts  . KNEE ARTHROSCOPY Left   . MOHS SURGERY  10/04/2015   squamous cell carcinoma left mandibular angle  Dr. Harvel Quale  . TOE AMPUTATION Bilateral 05/26/12   bilateral second toe amputations      Current Outpatient Medications:  .  alendronate (FOSAMAX) 70 MG tablet, Take 70 mg by mouth once a week. Take with a full glass of water on an empty stomach., Disp: , Rfl:  .  aspirin EC 81 MG EC tablet, Take 1 tablet (81 mg total) by mouth daily., Disp: , Rfl:  .  carbamide peroxide (DEBROX) 6.5 % OTIC solution, Place 5 drops into both ears 2 (two) times daily., Disp: , Rfl:  .  cholecalciferol (VITAMIN D) 1000 UNITS tablet, Take 1,000 Units by mouth. Take 2 tablets in morning and one tablet in evening, Disp: , Rfl:  .  COD LIVER OIL PO, Take 1 tablet by mouth daily. , Disp: , Rfl:  .  feeding supplement (BOOST HIGH PROTEIN) LIQD, Take 1 Container by mouth 2 (two) times daily between meals., Disp: , Rfl:  .  gabapentin (NEURONTIN) 100 MG capsule, Take 100 mg by mouth at bedtime., Disp: , Rfl:  .  metoprolol succinate (TOPROL-XL) 50 MG 24 hr tablet, Take 50 mg by mouth daily. Take with or immediately following a meal., Disp: , Rfl:  .  Multiple Vitamins-Minerals (MULTIVITAMIN ADULT PO), Take by mouth. Take one daily, Disp: , Rfl:  .  NAMZARIC 28-10 MG CP24, Take one tablet daily for memory, Disp: , Rfl:  .  senna-docusate (SENOKOT-S) 8.6-50 MG per tablet, Take 1 tablet by mouth at bedtime., Disp: , Rfl:  .  simvastatin (ZOCOR) 10 MG tablet, Take 10 mg by mouth daily at 6 PM. , Disp: , Rfl:  .  verapamil (VERELAN PM) 180 MG 24 hr capsule, Take 180 mg by mouth daily., Disp: , Rfl:  .  vitamin B-12 (CYANOCOBALAMIN) 1000 MCG tablet, Take 1,000 mcg daily by mouth., Disp: , Rfl:    Objective:   Vitals:   06/28/18 1201  BP: 115/65  Pulse: (!) 50  Resp: 16    Physical  Exam  Constitutional: She appears well-developed and well-nourished.  HENT:  Head: Normocephalic and atraumatic.    Eyes: Pupils are equal, round, and reactive to light.  Pulmonary/Chest: Effort normal.    Neurological: She is alert.  Skin: Skin is warm.    Assessment & Plan:  Squamous cell carcinoma of forehead  Squamous cell carcinoma of skin of chest  Excision of the squamous cell carcinoma of the left forehead and chest with possible ACell placement (500 mg and 5 x 5 cm sheet).  [place], DO

## 2018-07-30 LAB — BASIC METABOLIC PANEL
BUN: 20 (ref 4–21)
Creatinine: 0.8 (ref 0.5–1.1)
Glucose: 110
Potassium: 3.6 (ref 3.4–5.3)
Sodium: 136 — AB (ref 137–147)

## 2018-07-30 LAB — CBC AND DIFFERENTIAL
HCT: 41 (ref 36–46)
Hemoglobin: 13.9 (ref 12.0–16.0)
Platelets: 173 (ref 150–399)
WBC: 5.3

## 2018-08-02 ENCOUNTER — Other Ambulatory Visit: Payer: Self-pay

## 2018-08-02 ENCOUNTER — Encounter (HOSPITAL_BASED_OUTPATIENT_CLINIC_OR_DEPARTMENT_OTHER): Payer: Self-pay | Admitting: *Deleted

## 2018-08-02 NOTE — Progress Notes (Signed)
Patient is current resident of [facility] assisted [facility]. She will be arriving by personal vehicle with sons Broadus John and Shawntrice Salle. Spoke with Ms Owens Shark at nurses desk at [employer] and instructed her that pt is to be NPO after mn, give pt her Verapamil and Metoprolol with sip of water DOS.

## 2018-08-06 ENCOUNTER — Encounter: Payer: Self-pay | Admitting: Internal Medicine

## 2018-08-07 ENCOUNTER — Ambulatory Visit (HOSPITAL_BASED_OUTPATIENT_CLINIC_OR_DEPARTMENT_OTHER): Payer: Medicare Other | Admitting: Anesthesiology

## 2018-08-07 ENCOUNTER — Encounter (HOSPITAL_BASED_OUTPATIENT_CLINIC_OR_DEPARTMENT_OTHER)
Admission: RE | Disposition: A | Discharge status: Home / Self Care | Payer: Self-pay | Source: Ambulatory Visit | Attending: Plastic Surgery

## 2018-08-07 ENCOUNTER — Ambulatory Visit (HOSPITAL_BASED_OUTPATIENT_CLINIC_OR_DEPARTMENT_OTHER)
Admission: RE | Admit: 2018-08-07 | Discharge: 2018-08-07 | Disposition: A | Discharge status: Home / Self Care | Payer: Medicare Other | Source: Ambulatory Visit | Attending: Plastic Surgery | Admitting: Plastic Surgery

## 2018-08-07 ENCOUNTER — Encounter (HOSPITAL_BASED_OUTPATIENT_CLINIC_OR_DEPARTMENT_OTHER): Payer: Self-pay | Admitting: Plastic Surgery

## 2018-08-07 ENCOUNTER — Other Ambulatory Visit: Payer: Self-pay

## 2018-08-07 DIAGNOSIS — G4700 Insomnia, unspecified: Secondary | ICD-10-CM | POA: Insufficient documentation

## 2018-08-07 DIAGNOSIS — F0390 Unspecified dementia without behavioral disturbance: Secondary | ICD-10-CM | POA: Insufficient documentation

## 2018-08-07 DIAGNOSIS — K219 Gastro-esophageal reflux disease without esophagitis: Secondary | ICD-10-CM | POA: Insufficient documentation

## 2018-08-07 DIAGNOSIS — I10 Essential (primary) hypertension: Secondary | ICD-10-CM | POA: Diagnosis not present

## 2018-08-07 DIAGNOSIS — M069 Rheumatoid arthritis, unspecified: Secondary | ICD-10-CM | POA: Diagnosis not present

## 2018-08-07 DIAGNOSIS — M8580 Other specified disorders of bone density and structure, unspecified site: Secondary | ICD-10-CM | POA: Diagnosis not present

## 2018-08-07 DIAGNOSIS — C44529 Squamous cell carcinoma of skin of other part of trunk: Secondary | ICD-10-CM | POA: Insufficient documentation

## 2018-08-07 DIAGNOSIS — Z8673 Personal history of transient ischemic attack (TIA), and cerebral infarction without residual deficits: Secondary | ICD-10-CM | POA: Insufficient documentation

## 2018-08-07 DIAGNOSIS — Z8543 Personal history of malignant neoplasm of ovary: Secondary | ICD-10-CM | POA: Insufficient documentation

## 2018-08-07 DIAGNOSIS — Z853 Personal history of malignant neoplasm of breast: Secondary | ICD-10-CM | POA: Insufficient documentation

## 2018-08-07 DIAGNOSIS — Z7982 Long term (current) use of aspirin: Secondary | ICD-10-CM | POA: Diagnosis not present

## 2018-08-07 DIAGNOSIS — E785 Hyperlipidemia, unspecified: Secondary | ICD-10-CM | POA: Insufficient documentation

## 2018-08-07 DIAGNOSIS — C44329 Squamous cell carcinoma of skin of other parts of face: Secondary | ICD-10-CM | POA: Diagnosis not present

## 2018-08-07 DIAGNOSIS — Z79899 Other long term (current) drug therapy: Secondary | ICD-10-CM | POA: Insufficient documentation

## 2018-08-07 HISTORY — DX: Carcinoma in situ, unspecified: D09.9

## 2018-08-07 HISTORY — DX: Personal history of other diseases of the digestive system: Z87.19

## 2018-08-07 HISTORY — DX: Unspecified dementia, unspecified severity, without behavioral disturbance, psychotic disturbance, mood disturbance, and anxiety: F03.90

## 2018-08-07 HISTORY — PX: LESION EXCISION WITH COMPLEX REPAIR: SHX6700

## 2018-08-07 SURGERY — LESION EXCISION WITH COMPLEX REPAIR
Anesthesia: General | Site: Chest

## 2018-08-07 MED ORDER — EPHEDRINE SULFATE 50 MG/ML IJ SOLN
INTRAMUSCULAR | Status: DC | PRN
  Administered 2018-08-07: 15 mg via INTRAVENOUS

## 2018-08-07 MED ORDER — ACETAMINOPHEN 650 MG RE SUPP: 650.0000 mg | RECTAL | Status: DC | PRN

## 2018-08-07 MED ORDER — ONDANSETRON HCL 4 MG/2ML IJ SOLN
INTRAMUSCULAR | Status: DC | PRN
  Administered 2018-08-07: 4 mg via INTRAVENOUS

## 2018-08-07 MED ORDER — SODIUM CHLORIDE 0.9 % IV SOLN: 250.0000 mL | INTRAVENOUS | Status: DC | PRN

## 2018-08-07 MED ORDER — SCOPOLAMINE 1 MG/3DAYS TD PT72: 1.0000 | MEDICATED_PATCH | Freq: Once | TRANSDERMAL | Status: DC | PRN

## 2018-08-07 MED ORDER — PROPOFOL 500 MG/50ML IV EMUL
INTRAVENOUS | Status: DC | PRN
  Administered 2018-08-07: 75 ug/kg/min via INTRAVENOUS

## 2018-08-07 MED ORDER — MIDAZOLAM HCL 2 MG/2ML IJ SOLN: 1.0000 mg | INTRAMUSCULAR | Status: DC | PRN

## 2018-08-07 MED ORDER — SODIUM CHLORIDE 0.9% FLUSH: 3.0000 mL | INTRAVENOUS | Status: DC | PRN

## 2018-08-07 MED ORDER — PROPOFOL 10 MG/ML IV BOLUS
INTRAVENOUS | Status: DC | PRN
  Administered 2018-08-07: 70 mg via INTRAVENOUS

## 2018-08-07 MED ORDER — FENTANYL CITRATE (PF) 100 MCG/2ML IJ SOLN
INTRAMUSCULAR | Status: AC
  Filled 2018-08-07: qty 2

## 2018-08-07 MED ORDER — CEFAZOLIN SODIUM-DEXTROSE 1-4 GM/50ML-% IV SOLN
INTRAVENOUS | Status: AC
  Filled 2018-08-07: qty 50

## 2018-08-07 MED ORDER — OXYCODONE HCL 5 MG PO TABS: 5.0000 mg | ORAL_TABLET | ORAL | Status: DC | PRN

## 2018-08-07 MED ORDER — SODIUM CHLORIDE 0.9% FLUSH: 3.0000 mL | Freq: Two times a day (BID) | INTRAVENOUS | Status: DC

## 2018-08-07 MED ORDER — LIDOCAINE-EPINEPHRINE 1 %-1:100000 IJ SOLN
INTRAMUSCULAR | Status: DC | PRN
  Administered 2018-08-07: 10 mL

## 2018-08-07 MED ORDER — ACETAMINOPHEN 500 MG PO TABS: 500.0000 mg | ORAL_TABLET | ORAL | Status: DC | PRN

## 2018-08-07 MED ORDER — SODIUM CHLORIDE 0.9 % IV SOLN
INTRAVENOUS | Status: DC | PRN
  Administered 2018-08-07: 40 ug/min via INTRAVENOUS

## 2018-08-07 MED ORDER — LACTATED RINGERS IV SOLN
INTRAVENOUS | Status: DC
  Administered 2018-08-07 (×2): via INTRAVENOUS

## 2018-08-07 MED ORDER — CEFAZOLIN SODIUM-DEXTROSE 2-4 GM/100ML-% IV SOLN
2.0000 g | INTRAVENOUS | Status: AC
  Administered 2018-08-07: 1 g via INTRAVENOUS

## 2018-08-07 MED ORDER — FENTANYL CITRATE (PF) 100 MCG/2ML IJ SOLN: 25.0000 ug | INTRAMUSCULAR | Status: DC | PRN

## 2018-08-07 MED ORDER — ONDANSETRON HCL 4 MG/2ML IJ SOLN: 4.0000 mg | Freq: Once | INTRAMUSCULAR | Status: DC | PRN

## 2018-08-07 MED ORDER — FENTANYL CITRATE (PF) 100 MCG/2ML IJ SOLN
50.0000 ug | INTRAMUSCULAR | Status: DC | PRN
  Administered 2018-08-07: 25 ug via INTRAVENOUS

## 2018-08-07 SURGICAL SUPPLY — 49 items
BAG DECANTER FOR FLEXI CONT (MISCELLANEOUS) ×4 IMPLANT
BLADE SURG 15 STRL LF DISP TIS (BLADE) ×2 IMPLANT
BLADE SURG 15 STRL SS (BLADE) ×4
CANISTER SUCT 1200ML W/VALVE (MISCELLANEOUS) IMPLANT
COVER BACK TABLE 60X90IN (DRAPES) ×4 IMPLANT
DECANTER SPIKE VIAL GLASS SM (MISCELLANEOUS) ×3 IMPLANT
DRAPE U-SHAPE 76X120 STRL (DRAPES) ×4 IMPLANT
DRSG ADAPTIC 3X8 NADH LF (GAUZE/BANDAGES/DRESSINGS) IMPLANT
DRSG EMULSION OIL 3X3 NADH (GAUZE/BANDAGES/DRESSINGS) IMPLANT
DRSG PAD ABDOMINAL 8X10 ST (GAUZE/BANDAGES/DRESSINGS) IMPLANT
ELECT REM PT RETURN 9FT ADLT (ELECTROSURGICAL) ×4
ELECTRODE REM PT RTRN 9FT ADLT (ELECTROSURGICAL) ×2 IMPLANT
GAUZE SPONGE 4X4 12PLY STRL (GAUZE/BANDAGES/DRESSINGS) ×4 IMPLANT
GAUZE SPONGE 4X4 12PLY STRL LF (GAUZE/BANDAGES/DRESSINGS) IMPLANT
GLOVE BIO SURGEON STRL SZ 6.5 (GLOVE) ×6 IMPLANT
GLOVE BIO SURGEON STRL SZ7 (GLOVE) ×3 IMPLANT
GLOVE BIO SURGEONS STRL SZ 6.5 (GLOVE) ×2
GLOVE BIOGEL PI IND STRL 7.0 (GLOVE) ×1 IMPLANT
GLOVE BIOGEL PI INDICATOR 7.0 (GLOVE) ×2
GOWN STRL REUS W/ TWL LRG LVL3 (GOWN DISPOSABLE) ×4 IMPLANT
GOWN STRL REUS W/TWL LRG LVL3 (GOWN DISPOSABLE) ×8
NDL HYPO 25X1 1.5 SAFETY (NEEDLE) IMPLANT
NDL PRECISIONGLIDE 27X1.5 (NEEDLE) IMPLANT
NEEDLE HYPO 25X1 1.5 SAFETY (NEEDLE) IMPLANT
NEEDLE PRECISIONGLIDE 27X1.5 (NEEDLE) ×4 IMPLANT
NS IRRIG 1000ML POUR BTL (IV SOLUTION) ×4 IMPLANT
PACK BASIN DAY SURGERY FS (CUSTOM PROCEDURE TRAY) ×4 IMPLANT
PENCIL BUTTON HOLSTER BLD 10FT (ELECTRODE) ×3 IMPLANT
SHEET MEDIUM DRAPE 40X70 STRL (DRAPES) ×1 IMPLANT
SLEEVE SCD COMPRESS KNEE MED (MISCELLANEOUS) ×3 IMPLANT
SPONGE LAP 18X18 RF (DISPOSABLE) ×4 IMPLANT
STAPLER VISISTAT 35W (STAPLE) IMPLANT
SURGILUBE 2OZ TUBE FLIPTOP (MISCELLANEOUS) IMPLANT
SUT MNCRL 6-0 UNDY P1 1X18 (SUTURE) ×1 IMPLANT
SUT MNCRL AB 4-0 PS2 18 (SUTURE) ×3 IMPLANT
SUT MON AB 5-0 P3 18 (SUTURE) ×3 IMPLANT
SUT MON AB 5-0 PS2 18 (SUTURE) ×3 IMPLANT
SUT MONOCRYL 6-0 P1 1X18 (SUTURE) ×2
SUT SILK 3 0 PS 1 (SUTURE) IMPLANT
SUT SILK 4 0 P 3 (SUTURE) ×6 IMPLANT
SUT VIC AB 5-0 PS2 18 (SUTURE) ×1 IMPLANT
SYR BULB IRRIGATION 50ML (SYRINGE) ×4 IMPLANT
SYR CONTROL 10ML LL (SYRINGE) ×3 IMPLANT
TOWEL GREEN STERILE FF (TOWEL DISPOSABLE) ×4 IMPLANT
TRAY DSU PREP LF (CUSTOM PROCEDURE TRAY) ×4 IMPLANT
TUBE CONNECTING 20'X1/4 (TUBING) ×1
TUBE CONNECTING 20X1/4 (TUBING) ×3 IMPLANT
UNDERPAD 30X30 (UNDERPADS AND DIAPERS) ×4 IMPLANT
YANKAUER SUCT BULB TIP NO VENT (SUCTIONS) ×4 IMPLANT

## 2018-08-07 NOTE — Anesthesia Procedure Notes (Signed)
Procedure Name: LMA Insertion Date/Time: 08/07/2018 10:27 AM Performed by: Willa Frater, CRNA Pre-anesthesia Checklist: Patient identified, Emergency Drugs available, Suction available and Patient being monitored Patient Re-evaluated:Patient Re-evaluated prior to induction Oxygen Delivery Method: Circle system utilized Preoxygenation: Pre-oxygenation with 100% oxygen Induction Type: IV induction Ventilation: Mask ventilation without difficulty LMA: LMA inserted LMA Size: 3.0 Number of attempts: 1 Airway Equipment and Method: Bite block Placement Confirmation: positive ETCO2 Tube secured with: Tape Dental Injury: Teeth and Oropharynx as per pre-operative assessment

## 2018-08-07 NOTE — Op Note (Signed)
DATE OF OPERATION: 08/07/2018  LOCATION: [facility]  PREOPERATIVE DIAGNOSIS: Squamous cell carcinoma left temple  2 x 3.5 cm and chest 4 x 6 cm  POSTOPERATIVE DIAGNOSIS: Same  PROCEDURE: excision of quamous cell carcinoma left temple  2 x 3.5 cm and chest 4 x 6 cm  SURGEON: Claire H. J. Heinz, DO  ASSISTANT: Carmen Mayo, PA  EBL: none  CONDITION: Stable  COMPLICATIONS: None  INDICATION: The patient, Alexa Rowe, is a 84 y.o. female born on 03/01/1918, is here for treatment of squamous cell carcinoma of the left temple and chest area.   PROCEDURE DETAILS:  The patient was seen prior to surgery and marked.  The IV antibiotics were given. The patient was taken to the operating room and given a general anesthetic. A standard time out was performed and all information was confirmed by those in the room. SCDs were placed.   The face and chest were prepped and draped.  Local with epinepherine was injected into the skin around both lesion for intraoperative hemostasis and post operative pain control.   Temple: The #15 blade was to excise the 2 x 3.5 cm skin lesion from the left temple.  It was to fascia but did not seem to violate the fascia or bone.  A short stitch was placed superior and long stitch lateral.  Hemostasis was achieved with electrocautery.  The deep layers were closed with the 4-0 Monocryl and the skin with the 5-0  And 6-0 Monocryl.    The chest;  The bovie was used to used to excise the 4 x 6 cm lesion.   A short stitch was placed superior and long stitch lateral.  Hemostasis was achieved with electrocautery.  Undermining was performed for 1 cm on each side to achieve a tension free closure. The deep layers were closed with the 4-0 Monocryl. The skin was closed with the 4-0 Monocryl.  Derma bond and steri strips were applied to each site.  The patient was allowed to wake up and taken to recovery room in stable condition at the end of the case. The family  was notified at the end of the case.

## 2018-08-07 NOTE — Transfer of Care (Signed)
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  Last Pain:  Vitals:   08/07/18 0944  TempSrc: Oral  PainSc: 0-No pain         Complications: No apparent anesthesia complications

## 2018-08-07 NOTE — Anesthesia Preprocedure Evaluation (Addendum)
Anesthesia Evaluation  Patient identified by MRN, date of birth, ID band Patient awake    Reviewed: Allergy & Precautions, NPO status , Patient's Chart, lab work & pertinent test results  Airway Mallampati: II  TM Distance: >3 FB Neck ROM: Full    Dental  (+) Teeth Intact, Dental Advisory Given   Pulmonary neg pulmonary ROS,    Pulmonary exam normal breath sounds clear to auscultation       Cardiovascular hypertension, Pt. on home beta blockers and Pt. on medications Normal cardiovascular exam+ Valvular Problems/Murmurs MR  Rhythm:Regular Rate:Normal  Echo 10/13: Study Conclusions  - Left ventricle: The cavity size was normal. There was mildconcentric hypertrophy. Systolic function was normal. Theestimated ejection fraction was in the range of 55% to60%. Wall motion was normal; there were no regional wallmotion abnormalities. - Aortic valve: Mildly to moderately calcified annulus. Mildly calcified leaflets. Mild regurgitation. - Mitral valve: Calcified annulus. - Atrial septum: There was an atrial septal aneurysm.   Neuro/Psych PSYCHIATRIC DISORDERS Dementia TIA   GI/Hepatic Neg liver ROS, hiatal hernia, GERD  ,  Endo/Other  negative endocrine ROS  Renal/GU negative Renal ROS     Musculoskeletal  (+) Arthritis , Osteoarthritis and Rheumatoid disorders,    Abdominal   Peds  Hematology negative hematology ROS (+)   Anesthesia Other Findings Day of surgery medications reviewed with the patient.  Squamous cell carcinoma of skin of chest  Reproductive/Obstetrics                             Anesthesia Physical Anesthesia Plan  ASA: III  Anesthesia Plan: General   Post-op Pain Management:    Induction: Intravenous  PONV Risk Score and Plan: 3 and Treatment may vary due to age or medical condition, Ondansetron and TIVA  Airway Management Planned: LMA  Additional Equipment:    Intra-op Plan:   Post-operative Plan: Extubation in OR  Informed Consent: I have reviewed the patients History and Physical, chart, labs and discussed the procedure including the risks, benefits and alternatives for the proposed anesthesia with the patient or authorized representative who has indicated his/her understanding and acceptance.   Dental advisory given  Plan Discussed with: CRNA  Anesthesia Plan Comments: (DNR: suspend in perioperative period per POA.)      Anesthesia Quick Evaluation

## 2018-08-07 NOTE — H&P (Signed)
Alexa Rowe is an 84 y.o. female.   Chief Complaint: skin cancer HPI: The patient is a 84 yrs old wf here for treatment of a squamous cell carcinoma of her left temple and chest area.  A long discussion was had with the family about the options and they are in agreement for conservative excision. The DNR is on hold for the case.  The family wants everything done to revive her if something should happen during the procedure.  Past Medical History:  Diagnosis Date  . 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 Adult failure to thrive   . Arthritis    rhumatoid, osteo,  . Breast cancer ([place])    LEFT  . Cancer (HCC)    ovarian,breast  . Cognitive decline   . Debility   . Dementia ([place])   . GERD (gastroesophageal reflux disease)   . Hyperlipidemia   . Hypertension   . Insomnia   . Mild aortic insufficiency   . Mitral regurgitation   . Osteopenia   . Pyelonephritis   . RA (rheumatoid arthritis) ([place])   . Spondylolysis of cervical region   . Squamous cell carcinoma in situ    left temple and mid chest  . Stroke ([place]) 1996   TIA 1996  . TIA (transient ischemic attack)   . UTI (lower urinary tract infection)   . Wears glasses     Past Surgical History:  Procedure Laterality Date  . ABDOMINAL HYSTERECTOMY  1989   tah/bso-cancer-radiation  . AMPUTATION Right 05/14/2014   Procedure: RIGHT THIRD TOE AMPUTATION ;  Surgeon: Wylene Simmer, MD;  Location: [facility];  Service: Orthopedics;  Laterality: Right;  . BREAST LUMPECTOMY     left  . EYE SURGERY Bilateral    cataracts  . KNEE ARTHROSCOPY Left   . MOHS SURGERY  10/04/2015   squamous cell carcinoma left mandibular angle  Dr. Harvel Quale  . TOE AMPUTATION Bilateral 05/26/12   bilateral second toe amputations    Family History  Problem Relation Age of Onset  . Heart disease Father    Social History:  reports that she has never smoked. She has never used smokeless tobacco. She reports that she does not drink alcohol  or use drugs.  Allergies:  Allergies  Allergen Reactions  . Methotrexate Derivatives     Medications Prior to Admission  Medication Sig Dispense Refill  . alendronate (FOSAMAX) 70 MG tablet Take 70 mg by mouth once a week. Take with a full glass of water on an empty stomach.    Marland Kitchen aspirin EC 81 MG EC tablet Take 1 tablet (81 mg total) by mouth daily.    . cholecalciferol (VITAMIN D) 1000 UNITS tablet Take 1,000 Units by mouth. Take 2 tablets in morning and one tablet in evening    . COD LIVER OIL PO Take 1 tablet by mouth daily.     Marland Kitchen gabapentin (NEURONTIN) 100 MG capsule Take 100 mg by mouth at bedtime.    . metoprolol succinate (TOPROL-XL) 50 MG 24 hr tablet Take 50 mg by mouth 2 (two) times daily. Take with or immediately following a meal.     . Multiple Vitamins-Minerals (MULTIVITAMIN ADULT PO) Take by mouth. Take one daily    . NAMZARIC 28-10 MG CP24 Take one tablet daily for memory    . senna-docusate (SENOKOT-S) 8.6-50 MG per tablet Take 1 tablet by mouth at bedtime.    . simvastatin (ZOCOR) 10 MG tablet Take 10 mg by  mouth daily at 6 PM.     . verapamil (CALAN) 80 MG tablet Take 80 mg by mouth 2 (two) times daily.    . vitamin B-12 (CYANOCOBALAMIN) 1000 MCG tablet Take 1,000 mcg daily by mouth.    . carbamide peroxide (DEBROX) 6.5 % OTIC solution Place 5 drops into both ears 2 (two) times daily.    . feeding supplement (BOOST HIGH PROTEIN) LIQD Take 1 Container by mouth 2 (two) times daily between meals.      No results found for this or any previous visit (from the past 48 hour(s)). No results found.  Review of Systems  Constitutional: Negative.   HENT: Negative.   Eyes: Negative.   Respiratory: Negative.   Cardiovascular: Negative.   Gastrointestinal: Negative.   Genitourinary: Negative.   Musculoskeletal: Negative.   Skin: Negative.   Psychiatric/Behavioral: Negative.     Height 5\' 3"  (1.6 m), weight 54.4 kg. Physical Exam  Constitutional: She is oriented to  person, place, and time. She appears well-developed and well-nourished.  HENT:  Head: Normocephalic and atraumatic.    Eyes: Pupils are equal, round, and reactive to light. EOM are normal.  Cardiovascular: Normal rate.  Respiratory:    GI: Soft.  Neurological: She is alert and oriented to person, place, and time.  Skin: Skin is warm.  Psychiatric: She has a normal mood and affect. Her behavior is normal.     Assessment/Plan Plan for excision of left temple and chest skin cancers.  Will send to path for evaluation.  [place], DO 08/07/2018, 9:31 AM

## 2018-08-07 NOTE — Discharge Instructions (Signed)
Will need someone with her this evening. No heavy lifting. Head of the bed elevated tonight. Ice as tolerated to the left temple.   Post Anesthesia Home Care Instructions  Activity: Get plenty of rest for the remainder of the day. A responsible individual must stay with you for 24 hours following the procedure.  For the next 24 hours, DO NOT: -Drive a car -Paediatric nurse -Drink alcoholic beverages -Take any medication unless instructed by your physician -Make any legal decisions or sign important papers.  Meals: Start with liquid foods such as gelatin or soup. Progress to regular foods as tolerated. Avoid greasy, spicy, heavy foods. If nausea and/or vomiting occur, drink only clear liquids until the nausea and/or vomiting subsides. Call your physician if vomiting continues.  Special Instructions/Symptoms: Your throat may feel dry or sore from the anesthesia or the breathing tube placed in your throat during surgery. If this causes discomfort, gargle with warm salt water. The discomfort should disappear within 24 hours.  If you had a scopolamine patch placed behind your ear for the management of post- operative nausea and/or vomiting:  1. The medication in the patch is effective for 72 hours, after which it should be removed.  Wrap patch in a tissue and discard in the trash. Wash hands thoroughly with soap and water. 2. You may remove the patch earlier than 72 hours if you experience unpleasant side effects which may include dry mouth, dizziness or visual disturbances. 3. Avoid touching the patch. Wash your hands with soap and water after contact with the patch.

## 2018-08-07 NOTE — Interval H&P Note (Signed)
History and Physical Interval Note:  08/07/2018 11:19 AM  Alexa Rowe  has presented today for surgery, with the diagnosis of Squamous cell carcinoma of skin of chest  The various methods of treatment have been discussed with the patient and family. After consideration of risks, benefits and other options for treatment, the patient has consented to  Procedure(s): Excision of squamous cell carcinoma of the left forehead and chest (N/A) as a surgical intervention .  The patient's history has been reviewed, patient examined, no change in status, stable for surgery.  I have reviewed the patient's chart and labs.  Questions were answered to the patient's satisfaction.    After a lengthy discussion with the family the DNR is as follows.  We will suspend the DNR during the procedure.  The family wants everything done if needed.  Once she is in PACU we will notify the family before hand if intervention is needed.  Loel Lofty Dillingham

## 2018-08-07 NOTE — Anesthesia Postprocedure Evaluation (Signed)
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 (N/A Chest)     Patient location during evaluation: PACU Anesthesia Type: General Level of consciousness: awake Pain management: pain level controlled Vital Signs Assessment: post-procedure vital signs reviewed and stable Respiratory status: spontaneous breathing, nonlabored ventilation and respiratory function stable Cardiovascular status: blood pressure returned to baseline and stable Postop Assessment: no apparent nausea or vomiting Anesthetic complications: no    Last Vitals:  Vitals:   08/07/18 1219 08/07/18 1245  BP:  (!) 148/79  Pulse: (!) 59 64  Resp: 18 18  Temp:  36.6 C  SpO2: 95% 97%    Last Pain:  Vitals:   08/07/18 1245  TempSrc:   PainSc: 0-No pain                 Catalina Gravel

## 2018-08-08 ENCOUNTER — Encounter (HOSPITAL_BASED_OUTPATIENT_CLINIC_OR_DEPARTMENT_OTHER): Payer: Self-pay | Admitting: Plastic Surgery

## 2018-08-12 ENCOUNTER — Encounter (HOSPITAL_BASED_OUTPATIENT_CLINIC_OR_DEPARTMENT_OTHER): Payer: Self-pay | Admitting: Plastic Surgery

## 2018-08-13 ENCOUNTER — Ambulatory Visit (INDEPENDENT_AMBULATORY_CARE_PROVIDER_SITE_OTHER): Payer: Medicare Other | Admitting: Plastic Surgery

## 2018-08-13 ENCOUNTER — Encounter: Payer: Self-pay | Admitting: Plastic Surgery

## 2018-08-13 VITALS — BP 110/70 | HR 67 | Ht 61.0 in | Wt 120.0 lb

## 2018-08-13 DIAGNOSIS — C44529 Squamous cell carcinoma of skin of other part of trunk: Secondary | ICD-10-CM

## 2018-08-13 DIAGNOSIS — C44320 Squamous cell carcinoma of skin of unspecified parts of face: Secondary | ICD-10-CM

## 2018-08-13 NOTE — Progress Notes (Signed)
   Subjective:    Patient ID: Alexa Rowe, female    DOB: 1918/01/16, 84 y.o.   MRN: [id number]  The patient is a 84 year old white female here with her son for follow-up after excision of a lesion on her left temple area and chest.  The pathology showed the left temple area was a squamous cell carcinoma moderately differentiated with a 1 mm deep margin.  The chest lesion was invasive squamous cell carcinoma moderately differentiated with clear margins.  The incisions are healing well in both areas.  She has some bruising that is to be expected.  There is no sign of infection.  Her family is pleased with her progress.  They were given the results of the pathology.  Review of Systems  Constitutional: Negative.   HENT: Negative.   Respiratory: Negative.   Genitourinary: Negative.   Neurological: Negative.       Objective:   Physical Exam  Constitutional: She appears well-developed and well-nourished.  HENT:  Head: Normocephalic and atraumatic.  Eyes: Pupils are equal, round, and reactive to light. EOM are normal.  Pulmonary/Chest: Effort normal.  Psychiatric: She has a normal mood and affect. Her behavior is normal.      Assessment & Plan:  Squamous cell carcinoma of face  Squamous cell carcinoma of thoracic region The Steri-Strip was removed from the left temporal area.  I recommend the Steri-Strips remain on the chest area.  If they are still there in 2 weeks they can be removed.  Vaseline to the left temporal area daily for the next week.  If the sutures are still in place in the next 2 to 3 weeks this can be removed at the nursing facility as well.  A note was written to explain this.  We will see her back as needed.  The path results were explained to the patient and family.

## 2018-08-15 ENCOUNTER — Encounter: Payer: Medicare Other | Admitting: Plastic Surgery

## 2018-08-21 ENCOUNTER — Non-Acute Institutional Stay: Payer: Medicare Other | Admitting: Internal Medicine

## 2018-08-21 ENCOUNTER — Encounter: Payer: Self-pay | Admitting: Internal Medicine

## 2018-08-21 VITALS — BP 110/60 | HR 62 | Temp 97.6°F | Ht 63.0 in | Wt 117.0 lb

## 2018-08-21 DIAGNOSIS — C44529 Squamous cell carcinoma of skin of other part of trunk: Secondary | ICD-10-CM | POA: Diagnosis not present

## 2018-08-21 DIAGNOSIS — I10 Essential (primary) hypertension: Secondary | ICD-10-CM | POA: Diagnosis not present

## 2018-08-21 DIAGNOSIS — M069 Rheumatoid arthritis, unspecified: Secondary | ICD-10-CM

## 2018-08-21 DIAGNOSIS — F0150 Vascular dementia without behavioral disturbance: Secondary | ICD-10-CM

## 2018-08-21 DIAGNOSIS — M810 Age-related osteoporosis without current pathological fracture: Secondary | ICD-10-CM | POA: Insufficient documentation

## 2018-08-21 DIAGNOSIS — R634 Abnormal weight loss: Secondary | ICD-10-CM

## 2018-08-21 DIAGNOSIS — I080 Rheumatic disorders of both mitral and aortic valves: Secondary | ICD-10-CM

## 2018-08-21 NOTE — Progress Notes (Signed)
Location:  Occupational psychologist of Service:  Clinic (12)  Provider: Seven Dollens L. Mariea Clonts, D.O., C.M.D.  Code Status: DNR, MOST Goals of Care:  Advanced Directives 08/21/2018  Does Patient Have a Medical Advance Directive? Yes  Type of Paramedic of [place];Out of facility DNR (pink MOST or yellow form)  Does patient want to make changes to medical advance directive? No - Patient declined  Copy of [place] in Chart? Yes - validated most recent copy scanned in chart (See row information)  Pre-existing out of facility DNR order (yellow form or pink MOST form) Yellow form placed in chart (order not valid for inpatient use);Pink MOST form placed in chart (order not valid for inpatient use)   Chief Complaint  Patient presents with  . Medical Management of Chronic Issues    72mth follow-up    HPI: Patient is a 84 y.o. female seen today for medical management of chronic diseases.    She is 64 and recently had two mohs surgeries for her squamous cell ca with Dr. Marla Roe.  Left temple and chest wall. Left temple has healed.  Pt was denying having any wound on her chest to look at and was at appt alone.  She has no pain complaints.  Her son did come to get her for lunch at the end and asked how she's doing.  Her memory is declining more.  Her weight is down 3 lbs from earlier this month and 8 lbs from October.  She has boost bid ordered.    She is not on a DMARD for RA at 84 yo and did not tolerate methotrexate historically.  Oddly, she also does not have pain to speak of so it's not really warranted at 49 with dementia.     She does have osteoporosis for which she takes fosamax and vitamin D therapy.  She uses a walker for transfers and short distances and wheelchair for long distances.    Her son was also upset that she was here at the appt again without hearing aids which has been an issue once every 2 wks on average when he  visits her (he comes most every day for meals).  She cannot hear much of anything without them.    He notes that some staff are encouraging a restricted diet, but she is 6 and he wants her to be comfortable and enjoy chocolate if she likes it. This is consistent with her goals of care we outlined on her MOST form before.  We spoke about her skin cancers and he favors avoiding further surgical procedures for her, also at this point.    Past Medical History:  Diagnosis Date  . 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 Adult failure to thrive   . Arthritis    rhumatoid, osteo,  . Breast cancer ([place])    LEFT  . Cancer (HCC)    ovarian,breast  . Cognitive decline   . Debility   . Dementia ([place])   . GERD (gastroesophageal reflux disease)   . History of hiatal hernia   . Hyperlipidemia   . Hypertension   . Insomnia   . Mild aortic insufficiency   . Mitral regurgitation   . Osteopenia   . Pyelonephritis   . RA (rheumatoid arthritis) ([place])   . Spondylolysis of cervical region   . Squamous cell carcinoma in situ    left temple and mid chest  . Stroke [facility]) 1996  TIA 1996  . TIA (transient ischemic attack)   . UTI (lower urinary tract infection)   . Wears glasses     Past Surgical History:  Procedure Laterality Date  . ABDOMINAL HYSTERECTOMY  1989   tah/bso-cancer-radiation  . AMPUTATION Right 05/14/2014   Procedure: RIGHT THIRD TOE AMPUTATION ;  Surgeon: Wylene Simmer, MD;  Location: [facility];  Service: Orthopedics;  Laterality: Right;  . BREAST LUMPECTOMY     left  . EYE SURGERY Bilateral    cataracts  . KNEE ARTHROSCOPY Left   . LESION EXCISION WITH COMPLEX REPAIR N/A 08/07/2018   Procedure: Excision of squamous cell carcinoma of the left forehead and chest;  Surgeon: Wallace Going, DO;  Location: [facility];  Service: Plastics;  Laterality: N/A;  . MOHS SURGERY  10/04/2015   squamous cell carcinoma left mandibular angle  Dr.  Harvel Quale  . TOE AMPUTATION Bilateral 05/26/12   bilateral second toe amputations    Allergies  Allergen Reactions  . Methotrexate Derivatives     Outpatient Encounter Medications as of 08/21/2018  Medication Sig  . alendronate (FOSAMAX) 70 MG tablet Take 70 mg by mouth once a week. Take with a full glass of water on an empty stomach.  Marland Kitchen aspirin EC 81 MG EC tablet Take 1 tablet (81 mg total) by mouth daily.  . cholecalciferol (VITAMIN D) 1000 UNITS tablet Take 1,000 Units by mouth. Take 2 tablets in morning and one tablet in evening  . feeding supplement (BOOST HIGH PROTEIN) LIQD Take 1 Container by mouth 2 (two) times daily between meals.  . gabapentin (NEURONTIN) 100 MG capsule Take 100 mg by mouth at bedtime.  . metoprolol succinate (TOPROL-XL) 50 MG 24 hr tablet Take 50 mg by mouth 2 (two) times daily. Take with or immediately following a meal.   . NAMZARIC 28-10 MG CP24 Take one tablet daily for memory  . senna-docusate (SENOKOT-S) 8.6-50 MG per tablet Take 1 tablet by mouth at bedtime.  . simvastatin (ZOCOR) 10 MG tablet Take 10 mg by mouth daily at 6 PM.   . verapamil (CALAN) 80 MG tablet Take 80 mg by mouth 2 (two) times daily.  . vitamin B-12 (CYANOCOBALAMIN) 1000 MCG tablet Take 1,000 mcg daily by mouth.  . [DISCONTINUED] COD LIVER OIL PO Take 1 tablet by mouth daily.   . [DISCONTINUED] Multiple Vitamins-Minerals (MULTIVITAMIN ADULT PO) Take by mouth. Take one daily   No facility-administered encounter medications on file as of 08/21/2018.     Review of Systems:  Review of Systems  Constitutional: Negative for chills, fever and malaise/fatigue.  HENT: Positive for hearing loss.        Hearing aids were not in--also cerumen impacted  Eyes:       Glasses  Respiratory: Negative for shortness of breath.   Cardiovascular: Negative for chest pain, palpitations and leg swelling.  Gastrointestinal: Negative for abdominal pain, blood in stool, constipation, diarrhea and melena.    Genitourinary: Negative for dysuria.  Musculoskeletal: Negative for falls and joint pain.       Joint deformities primarily in feet from rA  Skin: Negative for itching and rash.  Neurological: Positive for sensory change. Negative for dizziness and loss of consciousness.  Endo/Heme/Allergies: Bruises/bleeds easily.  Psychiatric/Behavioral: Positive for memory loss. Negative for depression. The patient is not nervous/anxious and does not have insomnia.     Health Maintenance  Topic Date Due  . TETANUS/TDAP  10/15/2019  . INFLUENZA VACCINE  Completed  .  DEXA SCAN  Completed  . PNA vac Low Risk Adult  Completed    Physical Exam: Vitals:   08/21/18 1133  BP: 110/60  Pulse: 62  Temp: 97.6 F (36.4 C)  TempSrc: Oral  SpO2: 97%  Weight: 117 lb (53.1 kg)  Height: 5\' 3"  (1.6 m)   Body mass index is 20.73 kg/m. Physical Exam  HENT:  Head: Normocephalic and atraumatic.  Hearing aids were not in--also cerumen impacted  Cardiovascular: Normal rate, regular rhythm and intact distal pulses.  Murmur heard. Pulmonary/Chest: Effort normal and breath sounds normal. She has no rales.  Abdominal: Bowel sounds are normal.  Musculoskeletal: Normal range of motion.  Neurological: She is alert. No cranial nerve deficit.  Pleasant, very short term memory  Skin: Skin is warm and dry.  Temple wound well healed on left, sutures self-absorbing; also had surgery on chest wall  Psychiatric: She has a normal mood and affect.    Labs reviewed: Basic Metabolic Panel: Recent Labs    07/30/18 0700  NA 136*  K 3.6  BUN 20  CREATININE 0.8   Liver Function Tests: No results for input(s): AST, ALT, ALKPHOS, BILITOT, PROT, ALBUMIN in the last 8760 hours. No results for input(s): LIPASE, AMYLASE in the last 8760 hours. No results for input(s): AMMONIA in the last 8760 hours. CBC: Recent Labs    07/30/18 0700  WBC 5.3  HGB 13.9  HCT 41  PLT 173   Lipid Panel: No results for input(s):  CHOL, HDL, LDLCALC, TRIG, CHOLHDL, LDLDIRECT in the last 8760 hours. Lab Results  Component Value Date   HGBA1C 5.7 (H) 07/01/2012    Assessment/Plan 1. Vascular dementia without behavioral disturbance ([place]) -progressive with worsening short term memory and sleeping more (also aging and 89 now)  2. Squamous cell carcinoma of thoracic region And left temple -s/p mohs with plastic surgery -discussed would avoid putting her through further surgeries due to extensive recovery time and infection risks and her son is in complete agreement (apparently siblings were more involved in that decision)  3. Essential hypertension -bp controlled, pulse ok, no changes needed  4. Rheumatoid arthritis involving multiple sites, unspecified rheumatoid factor presence (HCC) -not on DMARD at 89 due to immune risks and frailty, dementia, also did not tolerate methotrexate historically  5. Mitral valve insufficiency and aortic valve insufficiency -asymptomatic  6. Senile osteoporosis -continues fosamax and vitamin D to prevent fxs if she should fall  7.  Weight loss -apparently not getting bid boost as she had been (may not be finishing) as she is losing weight over the past month (could also be affected by sleeping more and recent surgeries)  Labs/tests ordered:  No orders of the defined types were placed in this encounter.  Next appt:  4 mos med mgt  Siraj Dermody L. Bristyl Mclees, D.O. [facility] [address], [place] [address] Cell Phone (Mon-Fri 8am-5pm):  [phone] On Call:  [phone] & follow prompts after 5pm & weekends Office Phone:  [phone] Office Fax:  [phone]

## 2018-10-07 ENCOUNTER — Other Ambulatory Visit: Payer: Self-pay | Admitting: Adult Health

## 2018-10-07 MED ORDER — MORPHINE SULFATE (CONCENTRATE) 20 MG/ML PO SOLN: 5.0000 mg | Freq: Four times a day (QID) | ORAL | 0 refills | Status: AC | PRN

## 2018-11-24 DEATH — deceased

## 2019-01-01 ENCOUNTER — Encounter: Payer: Self-pay | Admitting: Internal Medicine
